# Patient Record
Sex: Male | Born: 1985 | Race: White | Hispanic: No | Marital: Married | Smoking: Never smoker
Health system: Southern US, Community
[De-identification: ages and names within clinical notes are randomized; demographics above are authoritative.]

## PROBLEM LIST (undated history)

## (undated) DIAGNOSIS — D696 Thrombocytopenia, unspecified: Secondary | ICD-10-CM

## (undated) DIAGNOSIS — R002 Palpitations: Secondary | ICD-10-CM

## (undated) DIAGNOSIS — D649 Anemia, unspecified: Secondary | ICD-10-CM

## (undated) DIAGNOSIS — Z87442 Personal history of urinary calculi: Secondary | ICD-10-CM

## (undated) DIAGNOSIS — J329 Chronic sinusitis, unspecified: Secondary | ICD-10-CM

## (undated) DIAGNOSIS — N189 Chronic kidney disease, unspecified: Secondary | ICD-10-CM

## (undated) DIAGNOSIS — G8250 Quadriplegia, unspecified: Secondary | ICD-10-CM

## (undated) DIAGNOSIS — J189 Pneumonia, unspecified organism: Secondary | ICD-10-CM

## (undated) DIAGNOSIS — N319 Neuromuscular dysfunction of bladder, unspecified: Secondary | ICD-10-CM

## (undated) DIAGNOSIS — R011 Cardiac murmur, unspecified: Secondary | ICD-10-CM

## (undated) DIAGNOSIS — N390 Urinary tract infection, site not specified: Secondary | ICD-10-CM

---
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
## 2006-01-31 ENCOUNTER — Encounter: Admission: RE | Admit: 2006-01-31 | Discharge: 2006-01-31 | Payer: Self-pay

## 2006-06-06 HISTORY — PX: OTHER SURGICAL HISTORY: SHX169

## 2007-05-07 DIAGNOSIS — J189 Pneumonia, unspecified organism: Secondary | ICD-10-CM

## 2007-05-07 HISTORY — PX: WISDOM TOOTH EXTRACTION: SHX21

## 2007-05-07 HISTORY — DX: Pneumonia, unspecified organism: J18.9

## 2007-08-12 ENCOUNTER — Inpatient Hospital Stay (HOSPITAL_COMMUNITY): Admission: EM | Admit: 2007-08-12 | Discharge: 2007-08-17 | Payer: Self-pay | Admitting: Emergency Medicine

## 2010-09-18 NOTE — Discharge Summary (Signed)
Ryan Watkins, Ryan Watkins                 ACCOUNT NO.:  [id number]   MEDICAL RECORD NO.:  [id number]          PATIENT TYPE:  INP   LOCATION:  4737                         FACILITY:  [facility]   PHYSICIAN:  Herbie Saxon, MDDATE OF BIRTH:  Jul 14, 1985   DATE OF ADMISSION:  08/12/2007  DATE OF DISCHARGE:  08/17/2007                               DISCHARGE SUMMARY   DISCHARGE DIAGNOSES:  1. Klebsiella urinary tract infection.  2. Bilateral pneumonia.  3. Stage 2 sacral decubitus.  4. History of bronchial asthma.  5. History of kidney stones.  6. History of quadriplegic.  7. Hypoxia.   RADIOLOGY:  The chest x-ray on admission for August 12, 2007 shows basilar  airspace disease, left greater than right.  Chest x-ray on discharge  August 17, 2007, shows left pleural effusion and lingual left lower  airspace disease with right basilar atelectasis.   HISTORY:  This 25 year old Caucasian male, quadriplegic presented to the  emergency room with 3-day history of cough, 2-day history of increasing  shortness of breath.  The patient was also noted to be desaturated at  the primary care physician's office.  The patient started on IV  antibiotics Avelox and clindamycin.  Blood culture and urine culture was  sent.  Urine culture came positive for Klebsiella pneumonia since he was  started on Levaquin.  The patient was nebulized on oxygen  supplementation.  Speech therapy involvement sought and evaluation shows  that he does not have overt signs of aspiration result on August 13, 2007.  The patient has clinically been improving, although there is a lag in  the radiological improvement.  The patient was noted to have a sacral  decubitus stage 2 at presentation and he was on Aqua-Gel dressing daily  for these.  He has been sent home to continue with the sacral wound care  and to be moved in bed every 2-3 hours .   DISCHARGE CONDITION:  Stable.   DIET:  Regular.   ACTIVITIES:  Without changes with  physical therapy.   FOLLOWUP:  Dr. Electa Sniff in 5-7 days to have a repeat urinalysis and chest  x-ray as necessary in one week.  He has been discharged to home health,  RN, and physical therapy care.   DISCHARGE MEDICATIONS:  1. Levaquin 500 mg daily 1 week.  2. Hemocyte Plus 1 tablet daily.  3. Albuterol MDI 2 puffs q.6 h. p.r.n.  4. Advair inhaler 250/50 one puff b.i.d.   PHYSICAL EXAMINATION:  GENERAL:  On examination, he is a young man, not  in acute distress.  VITAL SIGNS:  Temperature 98, pulse 92, respiratory rate 20, and blood  pressure 110/70.  HEENT:  Pale.  Clinically, not jaundiced.  Extraocular muscles are  intact.  Mucous membranes are moist.  Oropharynx and nasopharynx are  clear.  NECK:  Supple.  HEART:  Sounds 1 and 2.  Regular rate and rhythm.  CHEST:  Clear.  Bilateral rales at the bases, left greater than right.  ABDOMEN:  Benign.  NEUROLOGIC: She is alert and oriented to time, place, and person.  EXTREMITIES:  Peripheral pulses are present.  Deep contractures,  quadriplegia.  No pedal edema.   LABORATORY DATA:  WBC of 10, hematocrit 31, and platelet count is 175.  The chemistry shows sodium of 139, potassium is 3.6, chloride 105,  bicarbonate 27, BUN 9, creatinine is 0.4, and glucose is 122.  Blood  culture negative so far.   DISCHARGE PLAN:  Discussed with patient and his mother, they verbalized  understanding.   DISCHARGE TIME:  Greater than 30 minutes.      Herbie Saxon, MD  Electronically Signed     MIO/MEDQ  D:  08/17/2007  T:  08/18/2007  Job:  [id number]
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
## 2011-01-29 LAB — COMPREHENSIVE METABOLIC PANEL
ALT: 22
Albumin: 3.4 — ABNORMAL LOW
Alkaline Phosphatase: 109
BUN: 9
Calcium: 9
Creatinine, Ser: 0.44
GFR calc non Af Amer: >60
Glucose, Bld: 122 — ABNORMAL HIGH
Sodium: 139

## 2011-01-29 LAB — CARDIAC PANEL(CRET KIN+CKTOT+MB+TROPI): CK, MB: 3.5

## 2011-01-29 LAB — BLOOD GAS, ARTERIAL
Acid-Base Excess: 2.1 — ABNORMAL HIGH
Bicarbonate: 26.6 — ABNORMAL HIGH
O2 Saturation: 97.1
pCO2 arterial: 44.8
pH, Arterial: 7.391
pO2, Arterial: 90.4

## 2011-01-29 LAB — CBC
HCT: 31.3 — ABNORMAL LOW
Hemoglobin: 10.8 — ABNORMAL LOW
MCHC: 35.1
MCV: 84.9
MCV: 85.1
Platelets: 204
Platelets: 215
RBC: 3.69 — ABNORMAL LOW
RBC: 4.51
RDW: 14.3
WBC: 10.3
WBC: 15.7 — ABNORMAL HIGH

## 2011-01-29 LAB — URINALYSIS, ROUTINE W REFLEX MICROSCOPIC
Glucose, UA: NEGATIVE
Ketones, ur: 15 — AB
Specific Gravity, Urine: 1.024

## 2011-01-29 LAB — CULTURE, BLOOD (ROUTINE X 2)

## 2011-01-29 LAB — URINE CULTURE: Colony Count: >=100000

## 2011-01-29 LAB — DIFFERENTIAL
Eosinophils Absolute: 0
Eosinophils Relative: 0
Lymphocytes Relative: 3 — ABNORMAL LOW
Lymphs Abs: 0.5 — ABNORMAL LOW
Monocytes Absolute: 1
Monocytes Relative: 6
Neutrophils Relative %: 90 — ABNORMAL HIGH

## 2011-01-29 LAB — URINE MICROSCOPIC-ADD ON

## 2011-01-29 LAB — HEMOGLOBIN A1C: Mean Plasma Glucose: 94

## 2011-07-10 ENCOUNTER — Encounter (HOSPITAL_COMMUNITY): Payer: Self-pay | Admitting: Pharmacy Technician

## 2011-07-10 ENCOUNTER — Encounter (HOSPITAL_COMMUNITY): Payer: Self-pay | Admitting: *Deleted

## 2011-07-10 NOTE — Pre-Procedure Instructions (Signed)
Spoke with pts mother linda Agostinelli and did medical history and pre op instructions

## 2011-07-12 ENCOUNTER — Other Ambulatory Visit: Payer: Self-pay | Admitting: Urology

## 2011-07-22 NOTE — H&P (Addendum)
History of Present Illness   F/u neurogenic bladder on CIC. Pt has C4 quadraplegia. He had bladder stones a few years ago. He does CIC with the help of his mother QID. Oct 2012 US showed no hydronephrosis and 111 mL in bladder. He was changed to [place] last visit from [employer] to help with urge incontinence and autonomic dysreflexia. He tried oxybutynin and had "worse cotton mouth" he's ever had. 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 If he caths he will get wet after about 1-2 hours but sometimes within an hour. He is wet at night. He gets "a wierd feeling, his blood pressure does something funny", he gets sweaty with goose bumps when he leaks. He wears condom catheter. This is bothersome to him.   Interval Hx He follows up today to review his urodynamics and for cystoscopy.  I reviewed the urodynamics that showed a small capacity, hyperreflexic bladder with moderate incontinence. His capacity was 150 mL. His DLPP was 43-49 cm/H20. He voided by involuntary contraction with a slow flow and an elevated postvoid residual. EMG was normal but this may be artifact.   Past Medical History Problems  1. History of  Cosmetic Surgery V50.1 2. History of  Hay Fever 477.9 3. History of  Oral Surgery Tooth Extraction  Surgical History Problems  1. History of  Bladder Cystotomy With Direct Removal Of Calculus 2. History of  Cervical Vertebral Fusion 3. History of  Neck Surgery 4. History of  Surgical Flaps 5. History of  Wrist Surgery  Current Meds 1. Advair Diskus 250-50 MCG/DOSE Inhalation Aerosol Powder Breath Activated; Therapy:  05May2012 to 2. Advil TABS; Therapy: (Recorded:23Oct2008) to 3. Allergy TABS; Therapy: (Recorded:23Oct2008) to 4. Fluticasone Propionate 50 MCG/ACT Nasal Suspension; Therapy: 14Feb2013 to 5. Nitrofurantoin Monohyd Macro 100 MG Oral Capsule; take one capsule bid x 5 days then one  daily; Therapy: 17Jan2013 to (Evaluate:03Mar2013)  Requested for: 17Jan2013; Last  Rx:17Jan2013 6. Sanctura XR 60 MG Oral Capsule Extended Release 24 Hour; TAKE 1 CAPSULE DAILY IN THE  MORNING; Therapy: 15Oct2012 to (Evaluate:10Oct2013)  Requested for: 15Oct2012; Last  Rx:15Oct2012  Allergies Medication  1. Morphine Derivatives  Family History Problems  1. Maternal grandfather's history of  Brain Cancer V16.8 2. Maternal history of  Cervical Cancer 3. Paternal history of  Heart Disease V17.49 4. Paternal grandfather's history of  Heart Disease V17.49 5. Maternal history of  Lung Cancer V16.1  Social History Problems  1. Caffeine Use 2-3 can drinks per day 2. Marital History - Single 3. Never A Smoker Denied  4. Alcohol Use 5. Tobacco Use  Review of Systems Constitutional, skin, eye, otolaryngeal, hematologic/lymphatic, cardiovascular, pulmonary, endocrine, musculoskeletal, gastrointestinal, neurological and psychiatric system(s) were reviewed and pertinent findings if present are noted.  Gastrointestinal: constipation.    Physical Exam Constitutional: Well nourished and well developed . No acute distress.  Pulmonary: No respiratory distress and normal respiratory rhythm and effort.  Cardiovascular: Heart rate and rhythm are normal . No peripheral edema.  The patient is quadrapalegic.    Procedure  Procedure: Cystoscopy  Indication: Lower Urinary Tract Symptoms.  Informed Consent: Risks, benefits, and potential adverse events were discussed and informed consent was obtained from the patient.  Prep: The patient was prepped with betadine.  Antibiotic prophylaxis:. Nitrofurantoin.  Procedure Note:  Urethral meatus:. No abnormalities.  Anterior urethra: No abnormalities.  Prostatic urethra: No abnormalities.  Bladder: Visulization was clear. The ureteral orifices were in the normal anatomic position bilaterally and had clear efflux of urine. A systematic survey  of the bladder demonstrated no bladder tumors or stones. The mucosa was smooth without  abnormalities. Examination of the bladder demonstrated trabeculation. The patient tolerated the procedure well.  Complications: None.    Assessment Assessed  1. Neurogenic Bladder 596.54 2. Urinary Incontinence 788.30   Small capacity unstable bladder with incontinence.   Plan Neurogenic Bladder (596.54)  1. Follow-up Schedule Surgery Office  Follow-up  Requested for: 25Feb2013 Neurogenic Bladder (596.54), Urinary Incontinence (788.30)  2. URINE CULTURE  Done: 25Feb2013  Discussion/Summary     Discussed with the patient and his mother the nature risks benefits and alternatives to Botox. We about alternatives such as bladder augmentation, sphincterotomy, indwelling catheter or neuromodulation. We discussed trial of another anti-cholinergic or beta 3 agonists. We talked about the durability of the treatment and retreatment rates. We discussed risk of persistent or worsening incontinence. We also discussed risks of pain, bleeding, infection and other neuropathy among others. We discussed rare risks such as further nerve paralysis and death. I discussed with the patient the nature, benefits, risks, and alternatives to the procedure. We also discussed the likelihood of achieving the goals of the procedure and potential problems that might occur during the procedure or recuperation. All questions answered. The patient elects to proceed with Botox injection.  H&P update: Pt seen and examined today. He was treated with Cipro last week for UTI. He was having increased autonomic dysreflexia (AD). His urine cx in office grew klebsiella and e coli sensitive which was pan-sensitive with a f/u urine cx negative. Clinically he has improved (decreased AD) after Cipro. His WBC and temp/HR are normal today.

## 2011-07-23 ENCOUNTER — Ambulatory Visit (HOSPITAL_COMMUNITY): Payer: Commercial Managed Care - PPO | Admitting: Anesthesiology

## 2011-07-23 ENCOUNTER — Encounter (HOSPITAL_COMMUNITY): Payer: Self-pay | Admitting: Anesthesiology

## 2011-07-23 ENCOUNTER — Ambulatory Visit (HOSPITAL_COMMUNITY)
Admission: RE | Admit: 2011-07-23 | Discharge: 2011-07-23 | Disposition: A | Discharge status: Home / Self Care | Payer: Commercial Managed Care - PPO | Source: Ambulatory Visit | Attending: Urology | Admitting: Urology

## 2011-07-23 ENCOUNTER — Encounter (HOSPITAL_COMMUNITY): Payer: Self-pay

## 2011-07-23 ENCOUNTER — Encounter (HOSPITAL_COMMUNITY)
Admission: RE | Disposition: A | Discharge status: Home / Self Care | Payer: Self-pay | Source: Ambulatory Visit | Attending: Urology

## 2011-07-23 DIAGNOSIS — Z79899 Other long term (current) drug therapy: Secondary | ICD-10-CM | POA: Insufficient documentation

## 2011-07-23 DIAGNOSIS — G8250 Quadriplegia, unspecified: Secondary | ICD-10-CM | POA: Insufficient documentation

## 2011-07-23 DIAGNOSIS — R32 Unspecified urinary incontinence: Secondary | ICD-10-CM | POA: Insufficient documentation

## 2011-07-23 DIAGNOSIS — N319 Neuromuscular dysfunction of bladder, unspecified: Secondary | ICD-10-CM | POA: Insufficient documentation

## 2011-07-23 HISTORY — DX: Chronic sinusitis, unspecified: J32.9

## 2011-07-23 HISTORY — DX: Quadriplegia, unspecified: G82.50

## 2011-07-23 HISTORY — PX: CYSTOSCOPY WITH INJECTION: SHX1424

## 2011-07-23 HISTORY — DX: Neuromuscular dysfunction of bladder, unspecified: N31.9

## 2011-07-23 HISTORY — DX: Urinary tract infection, site not specified: N39.0

## 2011-07-23 HISTORY — DX: Pneumonia, unspecified organism: J18.9

## 2011-07-23 LAB — CBC
Hemoglobin: 14 g/dL (ref 13.0–17.0)
MCH: 29 pg (ref 26.0–34.0)
MCV: 84 fL (ref 78.0–100.0)
RBC: 4.82 MIL/uL (ref 4.22–5.81)

## 2011-07-23 SURGERY — CYSTOSCOPY, WITH INJECTION OF BLADDER NECK OR BLADDER WALL
Anesthesia: General | Site: Bladder | Wound class: Clean Contaminated

## 2011-07-23 MED ORDER — PROPOFOL 10 MG/ML IV EMUL
INTRAVENOUS | Status: DC | PRN
  Administered 2011-07-23: 200 mg via INTRAVENOUS

## 2011-07-23 MED ORDER — FENTANYL CITRATE 0.05 MG/ML IJ SOLN: 25.0000 ug | INTRAMUSCULAR | Status: DC | PRN

## 2011-07-23 MED ORDER — LACTATED RINGERS IV SOLN
INTRAVENOUS | Status: DC
Start: 2011-07-23 — End: 2011-07-23
  Administered 2011-07-23: 1000 mL via INTRAVENOUS

## 2011-07-23 MED ORDER — ACETAMINOPHEN 10 MG/ML IV SOLN
INTRAVENOUS | Status: DC | PRN
  Administered 2011-07-23: 1000 mg via INTRAVENOUS

## 2011-07-23 MED ORDER — CEFAZOLIN SODIUM 1-5 GM-% IV SOLN
INTRAVENOUS | Status: AC
  Filled 2011-07-23: qty 50

## 2011-07-23 MED ORDER — CEPHALEXIN 500 MG PO CAPS: 500.0000 mg | ORAL_CAPSULE | Freq: Three times a day (TID) | ORAL | Status: AC

## 2011-07-23 MED ORDER — STERILE WATER FOR IRRIGATION IR SOLN
Status: DC | PRN
  Administered 2011-07-23: 3000 mL

## 2011-07-23 MED ORDER — MUPIROCIN 2 % EX OINT
TOPICAL_OINTMENT | CUTANEOUS | Status: AC
  Filled 2011-07-23: qty 22

## 2011-07-23 MED ORDER — MIDAZOLAM HCL 5 MG/5ML IJ SOLN
INTRAMUSCULAR | Status: DC | PRN
  Administered 2011-07-23: 2 mg via INTRAVENOUS

## 2011-07-23 MED ORDER — FENTANYL CITRATE 0.05 MG/ML IJ SOLN
INTRAMUSCULAR | Status: DC | PRN
  Administered 2011-07-23: 100 ug via INTRAVENOUS

## 2011-07-23 MED ORDER — ACETAMINOPHEN 10 MG/ML IV SOLN
INTRAVENOUS | Status: AC
  Filled 2011-07-23: qty 100

## 2011-07-23 MED ORDER — ONABOTULINUMTOXINA 100 UNITS IJ SOLR
INTRAMUSCULAR | Status: DC | PRN
  Administered 2011-07-23: 200 [IU] via INTRAMUSCULAR

## 2011-07-23 MED ORDER — CEFAZOLIN SODIUM 1-5 GM-% IV SOLN
1.0000 g | INTRAVENOUS | Status: AC
  Administered 2011-07-23: 1 g via INTRAVENOUS

## 2011-07-23 MED ORDER — LIDOCAINE HCL (CARDIAC) 20 MG/ML IV SOLN
INTRAVENOUS | Status: DC | PRN
  Administered 2011-07-23: 50 mg via INTRAVENOUS

## 2011-07-23 MED ORDER — ONDANSETRON HCL 4 MG/2ML IJ SOLN
INTRAMUSCULAR | Status: DC | PRN
  Administered 2011-07-23: 4 mg via INTRAVENOUS

## 2011-07-23 MED ORDER — ONABOTULINUMTOXINA 100 UNITS IJ SOLR
400.0000 [IU] | INTRAMUSCULAR | Status: DC
  Filled 2011-07-23: qty 400

## 2011-07-23 SURGICAL SUPPLY — 15 items
BAG URO CATCHER STRL LF (DRAPE) ×2 IMPLANT
CLOTH BEACON ORANGE TIMEOUT ST (SAFETY) ×2 IMPLANT
DEFLUX NEEDLE (NEEDLE) IMPLANT
DEFLUX SYRINGE (SYRINGE) IMPLANT
DRAPE CAMERA CLOSED 9X96 (DRAPES) ×2 IMPLANT
GLOVE BIOGEL M STRL SZ7.5 (GLOVE) ×10 IMPLANT
GOWN STRL NON-REIN LRG LVL3 (GOWN DISPOSABLE) ×2 IMPLANT
GOWN STRL REIN XL XLG (GOWN DISPOSABLE) ×4 IMPLANT
MANIFOLD NEPTUNE II (INSTRUMENTS) ×2 IMPLANT
NDL SAFETY ECLIPSE 18X1.5 (NEEDLE) IMPLANT
NEEDLE HYPO 18GX1.5 SHARP (NEEDLE)
PACK CYSTO (CUSTOM PROCEDURE TRAY) ×2 IMPLANT
SYR CONTROL 10ML LL (SYRINGE) ×2 IMPLANT
TUBING CONNECTING 10 (TUBING) ×2 IMPLANT
WATER STERILE IRR 3000ML UROMA (IV SOLUTION) IMPLANT

## 2011-07-23 NOTE — Anesthesia Postprocedure Evaluation (Signed)
  Anesthesia Post-op Note  Patient: Ryan Watkins  Procedure(s) Performed: Procedure(s) (LRB): CYSTOSCOPY WITH INJECTION (N/A)  Patient Location: PACU  Anesthesia Type: General  Level of Consciousness: oriented and sedated  Airway and Oxygen Therapy: Patient Spontanous Breathing  Post-op Pain: none  Post-op Assessment: Post-op Vital signs reviewed, Patient's Cardiovascular Status Stable, Respiratory Function Stable and Patent Airway  Post-op Vital Signs: stable  Complications: No apparent anesthesia complications

## 2011-07-23 NOTE — Transfer of Care (Signed)
Immediate Anesthesia Transfer of Care Note  Patient: Ryan Watkins  Procedure(s) Performed: Procedure(s) (LRB): CYSTOSCOPY WITH INJECTION (N/A)  Patient Location: PACU  Anesthesia Type: General  Level of Consciousness: awake, alert , oriented and patient cooperative  Airway & Oxygen Therapy: Patient Spontanous Breathing  Post-op Assessment: Report given to PACU RN and Post -op Vital signs reviewed and stable  Post vital signs: Reviewed and stable  Complications: No apparent anesthesia complications

## 2011-07-23 NOTE — Brief Op Note (Signed)
07/23/2011  10:03 AM  PATIENT:  Ryan Watkins  26 y.o. male  PRE-OPERATIVE DIAGNOSIS:  neurogenic bladder,  incontinence  POST-OPERATIVE DIAGNOSIS:  neurogenic bladder  PROCEDURE:  Procedure(s) (LRB): CYSTOSCOPY WITH INJECTION BOTOX  SURGEON:  Surgeon(s) and Role:    * [name], MD - Primary    * Martina Sinner, MD - Assisting   ANESTHESIA:   general  EBL:   minimal   BLOOD ADMINISTERED:none  DRAINS: none   LOCAL MEDICATIONS USED:  NONE  SPECIMEN:  No Specimen  DISPOSITION OF SPECIMEN:  N/A  DICTATION: 161096  PLAN OF CARE: Discharge to home after PACU  PATIENT DISPOSITION:  PACU - hemodynamically stable.   Delay start of Pharmacological VTE agent (>24hrs) due to surgical blood loss or risk of bleeding: not applicable

## 2011-07-23 NOTE — Preoperative (Signed)
Beta Blockers   Reason not to administer Beta Blockers:Not Applicable 

## 2011-07-23 NOTE — Discharge Instructions (Signed)
Cystoscopy (Bladder Exam) Care After Refer to this sheet in the next few weeks. These discharge instructions provide you with general information on caring for yourself after you leave the hospital. Your caregiver may also give you specific instructions. Your treatment has been planned according to the most current medical practices available, but unavoidable complications sometimes occur. If you have any problems or questions after discharge, please call your caregiver. AFTER THE PROCEDURE   There may be temporary bleeding and burning with urination or catheterization.   Drink enough water and fluids to keep your urine clear or pale yellow.  FINDING OUT THE RESULTS OF YOUR TEST Not all test results are available during your visit. If your test results are not back during the visit, make an appointment with your caregiver to find out the results. Do not assume everything is normal if you have not heard from your caregiver or the medical facility. It is important for you to follow up on all of your test results. SEEK IMMEDIATE MEDICAL CARE IF:   There is an increase in blood in the urine or you are passing clots.   There is difficulty passing urine.   You develop the chills.   You have an oral temperature above 102 F (38.9 C), not controlled by medicine.   Belly (abdominal) pain develops.  Document Released: 11/09/2004 Document Revised: 04/11/2011 Document Reviewed: 09/07/2007 [facility] Patient Information 2012 [place], [place].

## 2011-07-23 NOTE — Progress Notes (Signed)
Unable to draw CBC. Spoke w Darlene in [place] and said she would draw labs w IV start

## 2011-07-23 NOTE — Anesthesia Preprocedure Evaluation (Addendum)
Anesthesia Evaluation  Patient identified by MRN, date of birth, ID band Patient awake    Reviewed: Allergy & Precautions, H&P , NPO status , Patient's Chart, lab work & pertinent test results, reviewed documented beta blocker date and time   Airway Mallampati: I TM Distance: >3 FB Neck ROM: Full    Dental  (+) Teeth Intact   Pulmonary neg pulmonary ROS,  breath sounds clear to auscultation        Cardiovascular negative cardio ROS  Rhythm:Regular Rate:Normal  Denies cardiac symptoms   Neuro/Psych paraplegia negative psych ROS   GI/Hepatic negative GI ROS, Neg liver ROS,   Endo/Other  negative endocrine ROS  Renal/GU negative Renal ROS   Neurogenic bladder    Musculoskeletal negative musculoskeletal ROS (+)   Abdominal   Peds negative pediatric ROS (+)  Hematology negative hematology ROS (+)   Anesthesia Other Findings   Reproductive/Obstetrics negative OB ROS                           Anesthesia Physical Anesthesia Plan  ASA: III  Anesthesia Plan: General   Post-op Pain Management:    Induction:   Airway Management Planned: LMA  Additional Equipment:   Intra-op Plan:   Post-operative Plan: Extubation in OR  Informed Consent: I have reviewed the patients History and Physical, chart, labs and discussed the procedure including the risks, benefits and alternatives for the proposed anesthesia with the patient or authorized representative who has indicated his/her understanding and acceptance.     Plan Discussed with: CRNA and Surgeon  Anesthesia Plan Comments:         Anesthesia Quick Evaluation

## 2011-07-24 NOTE — Op Note (Signed)
Ryan Watkins, MOONE NO.:  [id number]  MEDICAL RECORD NO.:  [id number]  LOCATION:  [facility]                         FACILITY:  [facility]  PHYSICIAN:  [name], MD   DATE OF BIRTH:  1985/11/23  DATE OF PROCEDURE: DATE OF DISCHARGE:  07/23/2011                              OPERATIVE REPORT   PREOPERATIVE DIAGNOSES: 1. Neurogenic bladder. 2. Incontinence.  POSTOPERATIVE DIAGNOSES: 1. Neurogenic bladder. 2. Incontinence.  PROCEDURE:  Cystoscopy with bladder injection of Botox.  SURGEON:  [name], MD.  ASSISTANT SURGEON:  Martina Sinner, MD.  ANESTHESIA:  General.  INDICATION FOR PROCEDURE:  Ryan Watkins is a 26 year old male who is a quadriplegic.  His caretaker, which is his mother, performs clean intermittent catheterization 4 times a day, but the patient is incontinent in between catheterizations and at night.  Cystoscopy was normal, but urodynamics revealed a small capacity hyper-reflexic bladder with poor emptying.  The patient has failed multiple antimuscarinics.  I discussed with the patient and his mother the nature, risks, benefits and alternatives to cystoscopy with Botox injection into the bladder wall.  All questions were answered and he elected to proceed with Botox injection.  FINDINGS:  On cystoscopy, the meatus was a bit tight, but the scope was able to pass with the obturator.  The urethra appeared normal.  The prostatic urethra was short and nonobstructing.  The bladder was normal without lesion mass or stone.  DESCRIPTION OF PROCEDURE:  After consent was obtained, the patient was brought into the operating room.  A time-out was performed to confirm the patient and procedure.  After adequate anesthesia, preop antibiotics, he was placed in lithotomy position and the external genitalia prepped and draped in the usual fashion.  The obturator was used to get the sheath into the urethra, then the cystoscope was advanced into  the bladder.  The bladder was drained, and then filled to an adequate capacity for injection.  The bladder was injected on the left side wall followed by the right side wall and then posteriorly for a total of 20 injections with 10 units/cc injection for a total of 200 units.  The patient's bladder was then drained.  There was excellent hemostasis.  The scope was then removed, and the patient was awakened and taken to recovery room in stable condition.  COMPLICATIONS:  None.  ESTIMATED BLOOD LOSS:  Minimal.  DRAINS:  None.  DISPOSITION:  Patient stable to PACU.          ______________________________ [name], MD     ME/MEDQ  D:  07/23/2011  T:  07/24/2011  Job:  [id number]

## 2011-07-31 ENCOUNTER — Encounter (HOSPITAL_COMMUNITY): Payer: Self-pay | Admitting: Urology

## 2012-01-29 ENCOUNTER — Other Ambulatory Visit: Payer: Self-pay | Admitting: Urology

## 2012-02-04 ENCOUNTER — Encounter (HOSPITAL_COMMUNITY): Payer: Self-pay | Admitting: Pharmacy Technician

## 2012-02-06 ENCOUNTER — Encounter (HOSPITAL_COMMUNITY): Payer: Self-pay | Admitting: *Deleted

## 2012-02-11 NOTE — Progress Notes (Signed)
02-11-12 1030 AM voice message left on Mother- Basim Bartnik' s cell [phone] of patients surgery time change to 12:15PM to arrive by 0945 AM to Short Stay. Other instructions unchanged. Please return call to verify [phone]. W. Kennon Portela

## 2012-02-17 NOTE — H&P (Signed)
History of Present Illness     F/u neurogenic bladder on CIC. Pt has C4 quadraplegia. He had bladder stones a few years ago. He does CIC with the help of his mother QID. Oct 2012 US showed no hydronephrosis and 111 mL in bladder. He was changed to Reunion last visit from Enablex to help with urge incontinence and autonomic dysreflexia. He tried oxybutynin and had "worse cotton mouth" he's ever had. 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 If he caths he will get wet after about 1-2 hours but sometimes within an hour. He is wet at night. He gets "a wierd feeling, his blood pressure does something funny", he gets sweaty with goose bumps when he leaks. He wears condom catheter. This is bothersome to him.   Cystoscopy Feb 2013 - normal  UDS - Feb 2013 - showed a small capacity, hyperreflexic bladder with moderate incontinence. His capacity was 150 mL. His DLPP was 43-49 cm/H20. He voided by involuntary contraction with a slow flow and an elevated postvoid residual. EMG was normal but this may be artifact.  Cysto/Botox injection in OR Mar 2013.   Interval Hx  He was admitted to hospital Aug 2013 . He was sick with fever. His blood and urine cx's grew e coli per pt. He completed an abx course for 2 weeks. He saw his PCP and was told he still had the infection. He completed another abx course, nitrofurantoin bid for 10 days.    He continues CIC. After the botox the sweaty sensation and leakage improved. He developed increased leakage between caths about 4 months after botox. He also noted a "sweaty feeling" again.  Now he leaks between every cath.    Past Medical History Problems  1. History of  Cosmetic Surgery V50.1 2. History of  Hay Fever 477.9 3. History of  Oral Surgery Tooth Extraction  Surgical History Problems  1. History of  Bladder Cystotomy With Direct Removal Of Calculus 2. History of  Cervical Vertebral Fusion 3. History of  Neck Surgery 4. History of  Surgical Flaps 5. History  of  Urologic Surgery 6. History of  Wrist Surgery  Current Meds 1. Advair Diskus 250-50 MCG/DOSE Inhalation Aerosol Powder Breath Activated; Therapy:  05May2012 to 2. Advil TABS; Therapy: (Recorded:23Oct2008) to 3. Allergy TABS; Therapy: (Recorded:23Oct2008) to  Allergies Medication  1. Morphine Derivatives  Family History Problems  1. Maternal grandfather's history of  Brain Cancer V16.8 2. Maternal history of  Cervical Cancer 3. Paternal history of  Heart Disease V17.49 4. Paternal grandfather's history of  Heart Disease V17.49 5. Maternal history of  Lung Cancer V16.1  Social History Problems  1. Caffeine Use 2-3 can drinks per day 2. Marital History - Single 3. Never A Smoker Denied  4. Alcohol Use 5. Tobacco Use  Vitals Vital Signs [Data Includes: Last 1 Day]  25Sep2013 11:11AM  Blood Pressure: 81 / 55 Temperature: 97.4 F Heart Rate: 86  Physical Exam Constitutional: Well nourished and well developed . No acute distress.  Pulmonary: No respiratory distress and normal respiratory rhythm and effort.  Cardiovascular: Heart rate and rhythm are normal . No peripheral edema.  Neuro/Psych:. Mood and affect are appropriate.    Results/Data  29 Jan 2012 10:54 AM   UA With REFLEX       COLOR YELLOW       APPEARANCE CLOUDY       SPECIFIC GRAVITY 1.015       pH 6.0       GLUCOSE  NEG       BILIRUBIN NEG       KETONE NEG       BLOOD LARGE       PROTEIN NEG       UROBILINOGEN 0.2       NITRITE NEG       LEUKOCYTE ESTERASE MOD       SQUAMOUS EPITHELIAL/HPF RARE       WBC 21-50       CRYSTALS Calcium Oxalate crystals noted       CASTS NONE SEEN       RBC NONE SEEN       BACTERIA MANY      Assessment Assessed  1. Neurogenic Bladder 596.54  Plan  Neurogenic Bladder (596.54)  1. Oxybutynin Chloride ER 10 MG Oral Tablet Extended Release 24 Hour; Take 1 tablet by mouth  daily; Therapy: 25Sep2013 to (Last Rx:25Sep2013)  Requested for: 25Sep2013; Edited 2.  Follow-up Month x 3 Office  Follow-up  Requested for: 30Dec2013 3. Follow-up Schedule Surgery Office  Follow-up  Done: 25Sep2013 4. RENAL U/S COMPLETE  Requested for: 30Dec2013  UA With REFLEX  Status: Resulted - Requires Verification  Done: 01Jan0001 12:00AM Ordered Today; For: Health Maintenance (V70.0); Ordered By: [name]  Due: 27Sep2013 Marked Important; Last Updated By: Emmaline Life URINE CULTURE  Status: Resulted - Requires Verification  Done: 01Jan0001 12:00AM Ordered Today; For: Urinary Tract Infection (599.0); Ordered By: [name]  Due: 27Sep2013 Marked Important; Last Updated By: Nathaniel Man   Discussion/Summary     We discussed as the botox wore off and he developed high pressure bladder contractions which led to a UTI. I discussed with patient and fiance these are the signs he needs to look for and notify me off as the botox wears off (sweaty sensation equals of autonomic dysreflexia, incontinence). We will restart oxybutynin and set him up for repeat botox. He elects to proceed with botox.     Signatures Electronically signed by : [name], M.D.; Feb 03 2012  8:13AM  Interval update - urine Cx grew e coli. Pt started on Cipro.

## 2012-02-18 ENCOUNTER — Ambulatory Visit (HOSPITAL_COMMUNITY): Payer: Commercial Managed Care - PPO | Admitting: Anesthesiology

## 2012-02-18 ENCOUNTER — Encounter (HOSPITAL_COMMUNITY): Payer: Self-pay | Admitting: Anesthesiology

## 2012-02-18 ENCOUNTER — Ambulatory Visit (HOSPITAL_COMMUNITY)
Admission: RE | Admit: 2012-02-18 | Discharge: 2012-02-18 | Disposition: A | Discharge status: Home / Self Care | Payer: Commercial Managed Care - PPO | Source: Ambulatory Visit | Attending: Urology | Admitting: Urology

## 2012-02-18 ENCOUNTER — Ambulatory Visit (HOSPITAL_COMMUNITY): Payer: Commercial Managed Care - PPO

## 2012-02-18 ENCOUNTER — Encounter (HOSPITAL_COMMUNITY)
Admission: RE | Disposition: A | Discharge status: Home / Self Care | Payer: Self-pay | Source: Ambulatory Visit | Attending: Urology

## 2012-02-18 ENCOUNTER — Encounter (HOSPITAL_COMMUNITY): Payer: Self-pay | Admitting: *Deleted

## 2012-02-18 DIAGNOSIS — N319 Neuromuscular dysfunction of bladder, unspecified: Secondary | ICD-10-CM | POA: Insufficient documentation

## 2012-02-18 DIAGNOSIS — Z79899 Other long term (current) drug therapy: Secondary | ICD-10-CM | POA: Insufficient documentation

## 2012-02-18 DIAGNOSIS — R32 Unspecified urinary incontinence: Secondary | ICD-10-CM | POA: Insufficient documentation

## 2012-02-18 DIAGNOSIS — J309 Allergic rhinitis, unspecified: Secondary | ICD-10-CM | POA: Insufficient documentation

## 2012-02-18 HISTORY — PX: CYSTOSCOPY: SHX5120

## 2012-02-18 LAB — CBC
Hemoglobin: 13.9 g/dL (ref 13.0–17.0)
MCH: 29.3 pg (ref 26.0–34.0)
MCV: 82.5 fL (ref 78.0–100.0)
RBC: 4.74 MIL/uL (ref 4.22–5.81)

## 2012-02-18 SURGERY — BOTOX INJECTION
Anesthesia: General | Site: Bladder | Wound class: Clean Contaminated

## 2012-02-18 MED ORDER — LACTATED RINGERS IV SOLN: INTRAVENOUS | Status: DC

## 2012-02-18 MED ORDER — ONDANSETRON HCL 4 MG/2ML IJ SOLN
INTRAMUSCULAR | Status: DC | PRN
  Administered 2012-02-18: 4 mg via INTRAVENOUS

## 2012-02-18 MED ORDER — CEFAZOLIN SODIUM-DEXTROSE 2-3 GM-% IV SOLR
2.0000 g | Freq: Once | INTRAVENOUS | Status: AC
  Administered 2012-02-18: 2 g via INTRAVENOUS

## 2012-02-18 MED ORDER — ONABOTULINUMTOXINA 100 UNITS IJ SOLR
300.0000 [IU] | Freq: Once | INTRAMUSCULAR | Status: DC
  Filled 2012-02-18: qty 300

## 2012-02-18 MED ORDER — FENTANYL CITRATE 0.05 MG/ML IJ SOLN: 25.0000 ug | INTRAMUSCULAR | Status: DC | PRN

## 2012-02-18 MED ORDER — PROPOFOL 10 MG/ML IV BOLUS
INTRAVENOUS | Status: DC | PRN
  Administered 2012-02-18: 200 mg via INTRAVENOUS

## 2012-02-18 MED ORDER — MUPIROCIN 2 % EX OINT
TOPICAL_OINTMENT | Freq: Two times a day (BID) | CUTANEOUS | Status: DC
  Filled 2012-02-18: qty 22

## 2012-02-18 MED ORDER — FENTANYL CITRATE 0.05 MG/ML IJ SOLN
INTRAMUSCULAR | Status: DC | PRN
  Administered 2012-02-18: 100 ug via INTRAVENOUS

## 2012-02-18 MED ORDER — MIDAZOLAM HCL 5 MG/5ML IJ SOLN
INTRAMUSCULAR | Status: DC | PRN
  Administered 2012-02-18: 2 mg via INTRAVENOUS

## 2012-02-18 MED ORDER — LACTATED RINGERS IV SOLN
INTRAVENOUS | Status: DC
Start: 2012-02-18 — End: 2012-02-18
  Administered 2012-02-18: 1000 mL via INTRAVENOUS

## 2012-02-18 MED ORDER — ONABOTULINUMTOXINA 100 UNITS IJ SOLR
INTRAMUSCULAR | Status: DC | PRN
  Administered 2012-02-18: 300 [IU] via INTRAMUSCULAR

## 2012-02-18 MED ORDER — OXYBUTYNIN CHLORIDE ER 10 MG PO TB24: 10.0000 mg | ORAL_TABLET | Freq: Every morning | ORAL | Status: DC

## 2012-02-18 MED ORDER — PROMETHAZINE HCL 25 MG/ML IJ SOLN: 6.2500 mg | INTRAMUSCULAR | Status: DC | PRN

## 2012-02-18 MED ORDER — LIDOCAINE HCL (CARDIAC) 20 MG/ML IV SOLN
INTRAVENOUS | Status: DC | PRN
  Administered 2012-02-18: 50 mg via INTRAVENOUS

## 2012-02-18 MED ORDER — CEFAZOLIN SODIUM-DEXTROSE 2-3 GM-% IV SOLR
INTRAVENOUS | Status: AC
  Filled 2012-02-18: qty 50

## 2012-02-18 MED ORDER — STERILE WATER FOR IRRIGATION IR SOLN
Status: DC | PRN
  Administered 2012-02-18: 1000 mL via INTRAVESICAL

## 2012-02-18 MED ORDER — MEPERIDINE HCL 50 MG/ML IJ SOLN: 6.2500 mg | INTRAMUSCULAR | Status: DC | PRN

## 2012-02-18 SURGICAL SUPPLY — 14 items
BAG URINE DRAINAGE (UROLOGICAL SUPPLIES) ×2 IMPLANT
BAG URO CATCHER STRL LF (DRAPE) ×2 IMPLANT
CATH FOLEY 2WAY SLVR  5CC 16FR (CATHETERS) ×1
CATH FOLEY 2WAY SLVR 5CC 16FR (CATHETERS) ×1 IMPLANT
CLOTH BEACON ORANGE TIMEOUT ST (SAFETY) ×2 IMPLANT
DRAPE CAMERA CLOSED 9X96 (DRAPES) ×2 IMPLANT
GLOVE BIOGEL M STRL SZ7.5 (GLOVE) ×2 IMPLANT
GOWN STRL NON-REIN LRG LVL3 (GOWN DISPOSABLE) ×2 IMPLANT
GOWN STRL REIN XL XLG (GOWN DISPOSABLE) ×2 IMPLANT
HOLDER FOLEY CATH W/STRAP (MISCELLANEOUS) ×2 IMPLANT
MANIFOLD NEPTUNE II (INSTRUMENTS) ×2 IMPLANT
PACK CYSTO (CUSTOM PROCEDURE TRAY) ×2 IMPLANT
SYR 20CC LL (SYRINGE) ×2 IMPLANT
TUBING CONNECTING 10 (TUBING) ×2 IMPLANT

## 2012-02-18 NOTE — Transfer of Care (Signed)
Immediate Anesthesia Transfer of Care Note  Patient: Ryan Watkins  Procedure(s) Performed: Procedure(s) (LRB) with comments: BOTOX INJECTION (N/A) CYSTOSCOPY (N/A)  Patient Location: PACU  Anesthesia Type: General  Level of Consciousness: awake, alert , oriented and patient cooperative  Airway & Oxygen Therapy: Patient Spontanous Breathing and Patient connected to face mask oxygen  Post-op Assessment: Report given to PACU RN and Post -op Vital signs reviewed and stable  Post vital signs: Reviewed and stable  Complications: No apparent anesthesia complications

## 2012-02-18 NOTE — Interval H&P Note (Signed)
History and Physical Interval Note:  02/18/2012 12:59 PM  Ryan Watkins  has presented today for surgery, with the diagnosis of Neurogenic Bladder  The various methods of treatment have been discussed with the patient and family. After consideration of risks, benefits and other options for treatment, the patient has consented to  Procedure(s) (LRB) with comments: BOTOX INJECTION (N/A) CYSTOSCOPY (N/A) as a surgical intervention .  The patient's history has been reviewed, patient examined, no change in status, stable for surgery.  I have reviewed the patient's chart and labs.  Questions were answered to the patient's satisfaction.  We discussed the off-label higher dose and he elects to proceed. He's had no fever or chills. His WBC count is normal. On Cipro.    [name]

## 2012-02-18 NOTE — Anesthesia Preprocedure Evaluation (Signed)
Anesthesia Evaluation  Patient identified by MRN, date of birth, ID band Patient awake    Reviewed: Allergy & Precautions, H&P , NPO status , Patient's Chart, lab work & pertinent test results, reviewed documented beta blocker date and time   Airway Mallampati: I TM Distance: >3 FB Neck ROM: Full    Dental  (+) Teeth Intact   Pulmonary neg pulmonary ROS, asthma , neg pneumonia -,  breath sounds clear to auscultation        Cardiovascular negative cardio ROS  Rhythm:Regular Rate:Normal  Denies cardiac symptoms   Neuro/Psych Quadraplegia. C4. negative psych ROS   GI/Hepatic negative GI ROS, Neg liver ROS,   Endo/Other  negative endocrine ROS  Renal/GU negative Renal ROS   Neurogenic bladder    Musculoskeletal negative musculoskeletal ROS (+)   Abdominal   Peds negative pediatric ROS (+)  Hematology negative hematology ROS (+)   Anesthesia Other Findings Previous trach. closed  Reproductive/Obstetrics negative OB ROS                           Anesthesia Physical  Anesthesia Plan  ASA: III  Anesthesia Plan: General   Post-op Pain Management:    Induction:   Airway Management Planned: LMA  Additional Equipment:   Intra-op Plan:   Post-operative Plan: Extubation in OR  Informed Consent: I have reviewed the patients History and Physical, chart, labs and discussed the procedure including the risks, benefits and alternatives for the proposed anesthesia with the patient or authorized representative who has indicated his/her understanding and acceptance.     Plan Discussed with: CRNA and Surgeon  Anesthesia Plan Comments:         Anesthesia Quick Evaluation

## 2012-02-18 NOTE — Anesthesia Postprocedure Evaluation (Signed)
  Anesthesia Post-op Note  Patient: Ryan Watkins  Procedure(s) Performed: Procedure(s) (LRB): BOTOX INJECTION (N/A) CYSTOSCOPY (N/A)  Patient Location: PACU  Anesthesia Type: General  Level of Consciousness: awake and alert   Airway and Oxygen Therapy: Patient Spontanous Breathing  Post-op Pain: mild  Post-op Assessment: Post-op Vital signs reviewed, Patient's Cardiovascular Status Stable, Respiratory Function Stable, Patent Airway and No signs of Nausea or vomiting  Post-op Vital Signs: stable  Complications: No apparent anesthesia complications

## 2012-02-18 NOTE — Op Note (Signed)
Preoperative diagnosis: Neurogenic bladder, incontinence Postoperative diagnosis: Same  Procedure: Cystoscopy with Botox injection  Surgeon: Mena Goes  Anesthesia: Gen.  Description of procedure: After consent was obtained patient brought to the operating room. A timeout was performed to confirm the patient and procedure. After adequate anesthesia the cystoscope with injection sheath was passed per urethra without difficulty. The anterior and prostatic urethra were normal. The trigone and ureteral orifices were normal. The bladder per normal without foreign body, mass, lesion or tumor.  300 units of Botox was injected. Botox was diluted to 10 units per cc. 1 cc was injected at 10 sites on the left bladder wall, 1 cc was injected at 10 sites on the posterior bladder wall, 1 cc was injected at 10 sites on the right bladder wall.   A 16 French Foley catheter was placed without difficulty.  Complications: None  Estimated blood loss: Minimal  Specimens: None  Drains: 16 French Foley  Disposition: Patient stable to PACU

## 2012-02-18 NOTE — Progress Notes (Signed)
Discharge instructions given to patient and patient's mother. Both verbalizing understanding with teachback. No questions.

## 2012-02-19 ENCOUNTER — Encounter (HOSPITAL_COMMUNITY): Payer: Self-pay | Admitting: Urology

## 2012-02-20 LAB — URINE CULTURE: Culture: NO GROWTH

## 2012-07-20 ENCOUNTER — Other Ambulatory Visit: Payer: Self-pay | Admitting: Urology

## 2012-07-24 ENCOUNTER — Encounter (HOSPITAL_COMMUNITY): Payer: Self-pay | Admitting: Pharmacy Technician

## 2012-07-30 NOTE — Progress Notes (Signed)
07-30-12 1600 Instructed Mother to have pt. Use Dulera Inhaler and bring, use Flonase as usual. Follow other previous  Instructions. W. Kennon Portela

## 2012-08-06 NOTE — H&P (Deleted)
History of Present Illness                F/u LUTS and BPH, hypogonasism seen Mar 2014. His PCP is Dr. Hulan Fess.   BPH/LUTS/Incomplete emptying   -1992 TURP -Sept 2009 - PSA 0.14 -2013 - Peripheral neuropathy with decreased sensation and cane use  - required I/O cath and drained 700 ml.  Reports extensive w/u - (brain and spinal imaging, spinal tap, bone marrow and endocrine eval). Some of this done at [facility].   -Sept 2013 - PSA 0.12 -Dec 2013 Abd U/S, comparison CT Dec 2012 - normal kidneys and no hydronephrosis -Mar 2014 PVR 113 ml, c/o weak stream, "bloated feeling". No frequency, urgency, dysuria or hematuria   Hypogonadism -Feb 2014 - T levels were 18, free 2. His prolactin was high, but FSH and LH were very high. His estrogen was normal. Pt started T supplementation. He has 4 children.       Interval Hx  He returns and started tamsulosin last visit. He noted . He has a "bloated feeling" in the SP area all the way back through the rectum.   Today he does tell me he did have brain and spinal imaging form his "head all the way down to the tail bone".   Here is here to review UDS and for cystoscopy.   Mar 2014 UDS - large, hypocontractile bladder with straining to void. Pre-study PVR 40 ml. Capacity of 900 ml ("bloated feeling") sensation nl to inc, bladder stable. Voided 200 ml at flow 8 cc/sec, detrusor low amplitude 7-10 cm/h20 and straining.  EMG increased with straining.    Past Medical History Problems  1. History of  Acute Myocardial Infarction V12.59 2. History of  Hypercholesterolemia 272.0 3. History of  Seizure  Surgical History Problems  1. History of  Ankle Repair 2. History of  CABG (CABG) V45.81 3. History of  Colon Surgery 4. History of  Prostate Surgery  Current Meds 1. Aspirin 81 MG Oral Tablet; Therapy: (Recorded:20Mar2014) to 2. Calcium TABS; Therapy: (Recorded:20Mar2014) to 3. CoQ-10 100 MG Oral Capsule; Therapy: (Recorded:20Mar2014) to 4.  Dilantin 100 MG Oral Capsule; Therapy: (Recorded:20Mar2014) to 5. Lipitor 20 MG Oral Tablet; Therapy: (Recorded:20Mar2014) to 6. Multi-Vitamin TABS; Therapy: (Recorded:20Mar2014) to 7. Omega 3 CAPS; Therapy: (Recorded:20Mar2014) to 8. PHENobarbital 97.2 MG Oral Tablet; Therapy: (Recorded:20Mar2014) to 9. PredniSONE 20 MG Oral Tablet; Therapy: (Recorded:20Mar2014) to 10. PreserVision/Lutein Oral Capsule; Therapy: (Recorded:20Mar2014) to 11. Tamsulosin HCl 0.4 MG Oral Capsule; Take one capsule daily; Therapy: [id number] to   (Evaluate:15Mar2015)  Requested for: [id number]; Last Rx:20Mar2014  Allergies Medication  1. Amoxicillin CAPS  Family History Problems  1. Family history of  Death In The Family Father Died age 88-Coronary 2. Family history of  Death In The Family Mother Died age 29-Heart failure 3. Family history of  Family Health Status Number Of Children 2 sons and 2 daughters  Social History Problems  1. Being A Social Drinker 2. Caffeine Use 4 per day 3. Former Smoker V15.82 Smoked 1 ppd for 5 yrs and quit 1967 4. Marital History - Currently Married 5. Occupation: Retired  Review of Systems  Gastrointestinal: constipation.  Cardiovascular: no chest pain.  Respiratory: no shortness of breath.    Physical Exam Constitutional: Well nourished and well developed . No acute distress.  Pulmonary: No respiratory distress and normal respiratory rhythm and effort.  Cardiovascular: Heart rate and rhythm are normal . No peripheral edema.  Neuro/Psych:. Mood and affect are appropriate.  Results/Data Urine [Data Includes: Last 1 Day]   31Mar2014  COLOR YELLOW   APPEARANCE CLEAR   SPECIFIC GRAVITY 1.025   pH 6.0   GLUCOSE NEG mg/dL  BILIRUBIN NEG   KETONE NEG mg/dL  BLOOD NEG   PROTEIN NEG mg/dL  UROBILINOGEN 0.2 mg/dL  NITRITE NEG   LEUKOCYTE ESTERASE NEG    Procedure  Procedure: Cystoscopy   Informed Consent: Risks, benefits, and potential adverse events  were discussed and informed consent was obtained from the patient.  Prep: The patient was prepped with betadine.  Procedure Note:  Urethral meatus:. No abnormalities.  Anterior urethra: No abnormalities.  Prostatic urethra: No abnormalities.  Good TURP defect but at the bladder neck there is regrowth of a small flap of tissue causing 100% VO of bladder neck.  Bladder: Visulization was clear. The ureteral orifices were in the normal anatomic position bilaterally and had clear efflux of urine. A systematic survey of the bladder demonstrated no bladder tumors or stones. The mucosa was smooth without abnormalities. The patient tolerated the procedure well.  Complications: None.    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 Incomplete Emptying Of Bladder 788.21  Plan Health Maintenance (V70.0)  1. UA With REFLEX  Done: 31Mar2014 09:51AM  Discussion/Summary           We discussed options such as surveillance, foley, CIC, Interstim and R/B of each approach. IS may be a good option but I would like for him to stabilize from a neurologic perspective. He will continue on tamsulosin.   In regards to the VO prostate tissue we discussed the nature, R/B of TUR of this residual/recurrent tissue. 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 Therefore, repeat TUR may or may not help him empty better but I think the risks of bleeding, incontinence and scar tissue/sx would be low with the possible benefit of a better flow and better emptying. He elects to proceed with repeat TURP. We also discussed CV / pulm risks associated with anesthesia and he may or may not need a foley post - op. With his h/o of CIC last year a foley overnight is probably wise.      Signatures Electronically signed by : [name], M.D.; Aug 03 2012 11:04AM

## 2012-08-06 NOTE — H&P (Signed)
History of Present Illness                               F/u neurogenic bladder on CIC. Pt has C4 quadraplegia. He had bladder stones a few years ago. He does CIC with the help of his mother QID. -Oct 2012 US showed no hydronephrosis and 111 mL in bladder. He was changed to Reunion last visit from Enablex to help with urge incontinence and autonomic dysreflexia. He tried oxybutynin and had "worse cotton mouth" he's ever had. 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 If he caths he will get wet after about 1-2 hours but sometimes within an hour. He is wet at night. He gets "a wierd feeling, his blood pressure does something funny", he gets sweaty with goose bumps when he leaks. He wears condom catheter. This is bothersome to him.  -Feb 2013 - cystoscopy normal -Feb 2013 - UDS - showed a small capacity, hyperreflexic bladder with moderate incontinence. His capacity was 150 mL. His DLPP was 43-49 cm/H20. He voided by involuntary contraction with a slow flow and an elevated postvoid residual. EMG was normal but this may be artifact. -Mar 2013 - Cysto/Botox injection in OR Mar 2013. -Aug 2013 - UTI and admission -Oct 2013 Botox 300U injection  -Dec 2013 Renal US - normal apart from poss 5 mm and 4 mm right renal stone    Interval Hx He returns for KUB. He noticed some leakage a couple of weeks ago but it has not recurred. He is leaking a little at night which has started again. He has not had any abx. He has no fever and feels well. He is getting married in one month.   KUB today - 11 x 4 mm right LP stone. [place]. Bones and bowel gas pattern normal.     Past Medical History Problems  1. History of  Cosmetic Surgery V50.1 2. History of  Hay Fever 477.9 3. History of  Oral Surgery Tooth Extraction  Surgical History Problems  1. History of  Bladder Cystotomy With Direct Removal Of Calculus 2. History of  Cervical Vertebral Fusion 3. History of  Neck Surgery 4. History of  Surgical  Flaps 5. History of  Urologic Surgery 6. History of  Urologic Surgery 7. History of  Wrist Surgery  Current Meds 1. Advair Diskus 250-50 MCG/DOSE Inhalation Aerosol Powder Breath Activated; Therapy:  05May2012 to 2. Advil TABS; Therapy: (Recorded:23Oct2008) to 3. Allergy TABS; Therapy: (Recorded:23Oct2008) to  Allergies Medication  1. Morphine Derivatives  Family History Problems  1. Maternal grandfather's history of  Brain Cancer V16.8 2. Maternal history of  Cervical Cancer 3. Paternal history of  Heart Disease V17.49 4. Paternal grandfather's history of  Heart Disease V17.49 5. Maternal history of  Lung Cancer V16.1  Social History Problems  1. Caffeine Use 2-3 can drinks per day 2. Marital History - Single 3. Never A Smoker Denied  4. Alcohol Use 5. Tobacco Use  Review of Systems Constitutional system(s) were reviewed and pertinent findings if present are noted.  Cardiovascular: no chest pain.  Respiratory: no shortness of breath.    Vitals Vital Signs [Data Includes: Last 1 Day]  31Mar2014 10:59AM  Blood Pressure: 142 / 91 Temperature: 97.7 F Heart Rate: 77  Physical Exam Constitutional: Well nourished and well developed . No acute distress.  Pulmonary: No respiratory distress and normal respiratory rhythm and effort.  Cardiovascular: Heart rate and rhythm are normal .  No peripheral edema.  Neuro/Psych:. Mood and affect are appropriate.    Results/Data Urine [Data Includes: Last 1 Day]   31Mar2014  COLOR YELLOW   APPEARANCE CLEAR   SPECIFIC GRAVITY 1.010   pH 6.0   GLUCOSE NEG mg/dL  BILIRUBIN NEG   KETONE NEG mg/dL  BLOOD NEG   PROTEIN NEG mg/dL  UROBILINOGEN 0.2 mg/dL  NITRITE POS   LEUKOCYTE ESTERASE SMALL   SQUAMOUS EPITHELIAL/HPF NONE SEEN   WBC 21-50 WBC/hpf  RBC NONE SEEN RBC/hpf  BACTERIA MANY   CRYSTALS NONE SEEN   CASTS NONE SEEN    Assessment Assessed  1. Asymptomatic Bacteruria 791.9 2. Neurogenic Bladder 596.54 3.  Nephrolithiasis 592.0  Plan Asymptomatic Bacteruria (791.9)  1. URINE CULTURE  Done: 31Mar2014 Health Maintenance (V70.0)  2. UA With REFLEX  Done: 31Mar2014 10:14AM Nephrolithiasis (592.0)  3. Follow-up Month x 4 Office  Follow-up  Requested for: 31Mar2014 4. KUB  Requested for: 31Mar2014  Discussion/Summary     Send urine for Cx as a precaution. Botox Friday - will start abx Wed or Thurs based on Culture.     Signatures Electronically signed by : [name], M.D.; Aug 03 2012 11:28AM

## 2012-08-07 ENCOUNTER — Ambulatory Visit (HOSPITAL_COMMUNITY)
Admission: RE | Admit: 2012-08-07 | Discharge: 2012-08-07 | Disposition: A | Discharge status: Home / Self Care | Payer: Commercial Managed Care - PPO | Source: Ambulatory Visit | Attending: Urology | Admitting: Urology

## 2012-08-07 ENCOUNTER — Ambulatory Visit (HOSPITAL_COMMUNITY): Payer: Commercial Managed Care - PPO | Admitting: Anesthesiology

## 2012-08-07 ENCOUNTER — Encounter (HOSPITAL_COMMUNITY)
Admission: RE | Disposition: A | Discharge status: Home / Self Care | Payer: Self-pay | Source: Ambulatory Visit | Attending: Urology

## 2012-08-07 ENCOUNTER — Encounter (HOSPITAL_COMMUNITY): Payer: Self-pay | Admitting: Anesthesiology

## 2012-08-07 DIAGNOSIS — G8250 Quadriplegia, unspecified: Secondary | ICD-10-CM | POA: Insufficient documentation

## 2012-08-07 DIAGNOSIS — R82998 Other abnormal findings in urine: Secondary | ICD-10-CM | POA: Diagnosis not present

## 2012-08-07 DIAGNOSIS — N200 Calculus of kidney: Secondary | ICD-10-CM | POA: Diagnosis not present

## 2012-08-07 DIAGNOSIS — N3941 Urge incontinence: Secondary | ICD-10-CM | POA: Insufficient documentation

## 2012-08-07 DIAGNOSIS — N319 Neuromuscular dysfunction of bladder, unspecified: Secondary | ICD-10-CM | POA: Insufficient documentation

## 2012-08-07 HISTORY — PX: CYSTOSCOPY: SHX5120

## 2012-08-07 LAB — CBC
Hemoglobin: 15 g/dL (ref 13.0–17.0)
MCH: 29.5 pg (ref 26.0–34.0)
MCV: 83.7 fL (ref 78.0–100.0)
RBC: 5.09 MIL/uL (ref 4.22–5.81)

## 2012-08-07 SURGERY — CYSTOSCOPY
Anesthesia: General | Wound class: Clean Contaminated

## 2012-08-07 MED ORDER — ACETAMINOPHEN 10 MG/ML IV SOLN
INTRAVENOUS | Status: DC | PRN
  Administered 2012-08-07: 1000 mg via INTRAVENOUS

## 2012-08-07 MED ORDER — LACTATED RINGERS IV SOLN: INTRAVENOUS | Status: DC

## 2012-08-07 MED ORDER — LACTATED RINGERS IV SOLN
INTRAVENOUS | Status: DC
  Administered 2012-08-07: 1000 mL via INTRAVENOUS

## 2012-08-07 MED ORDER — MUPIROCIN 2 % EX OINT
TOPICAL_OINTMENT | Freq: Two times a day (BID) | CUTANEOUS | Status: DC
  Filled 2012-08-07: qty 22

## 2012-08-07 MED ORDER — PROPOFOL 10 MG/ML IV BOLUS
INTRAVENOUS | Status: DC | PRN
  Administered 2012-08-07: 200 mg via INTRAVENOUS

## 2012-08-07 MED ORDER — CIPROFLOXACIN IN D5W 400 MG/200ML IV SOLN
400.0000 mg | INTRAVENOUS | Status: AC
  Administered 2012-08-07: 400 mg via INTRAVENOUS

## 2012-08-07 MED ORDER — STERILE WATER FOR IRRIGATION IR SOLN: Status: DC | PRN

## 2012-08-07 MED ORDER — LIDOCAINE HCL (CARDIAC) 10 MG/ML IV SOLN
INTRAVENOUS | Status: DC | PRN
  Administered 2012-08-07: 75 mg via INTRAVENOUS

## 2012-08-07 MED ORDER — SODIUM CHLORIDE 0.9 % IR SOLN
Status: DC | PRN
  Administered 2012-08-07: 3000 mL

## 2012-08-07 MED ORDER — ONABOTULINUMTOXINA 100 UNITS IJ SOLR
INTRAMUSCULAR | Status: DC | PRN
  Administered 2012-08-07: 300 [IU] via INTRAMUSCULAR

## 2012-08-07 MED ORDER — ONABOTULINUMTOXINA 100 UNITS IJ SOLR
400.0000 [IU] | Freq: Once | INTRAMUSCULAR | Status: DC
  Filled 2012-08-07: qty 400

## 2012-08-07 MED ORDER — CIPROFLOXACIN IN D5W 400 MG/200ML IV SOLN
INTRAVENOUS | Status: AC
  Filled 2012-08-07: qty 200

## 2012-08-07 MED ORDER — PROMETHAZINE HCL 25 MG/ML IJ SOLN: 6.2500 mg | INTRAMUSCULAR | Status: DC | PRN

## 2012-08-07 MED ORDER — FENTANYL CITRATE 0.05 MG/ML IJ SOLN
INTRAMUSCULAR | Status: DC | PRN
  Administered 2012-08-07: 100 ug via INTRAVENOUS

## 2012-08-07 MED ORDER — ONDANSETRON HCL 4 MG/2ML IJ SOLN
INTRAMUSCULAR | Status: DC | PRN
  Administered 2012-08-07: 4 mg via INTRAVENOUS

## 2012-08-07 MED ORDER — ACETAMINOPHEN 10 MG/ML IV SOLN
INTRAVENOUS | Status: AC
  Filled 2012-08-07: qty 100

## 2012-08-07 MED ORDER — MIDAZOLAM HCL 5 MG/5ML IJ SOLN
INTRAMUSCULAR | Status: DC | PRN
  Administered 2012-08-07: 2 mg via INTRAVENOUS

## 2012-08-07 MED ORDER — SODIUM CHLORIDE 0.9 % IJ SOLN
INTRAMUSCULAR | Status: DC | PRN
  Administered 2012-08-07: 50 mL via INTRAVENOUS

## 2012-08-07 MED ORDER — HYDROMORPHONE HCL PF 1 MG/ML IJ SOLN: 0.2500 mg | INTRAMUSCULAR | Status: DC | PRN

## 2012-08-07 SURGICAL SUPPLY — 9 items
BAG URO CATCHER STRL LF (DRAPE) ×2 IMPLANT
CLOTH BEACON ORANGE TIMEOUT ST (SAFETY) ×2 IMPLANT
DRAPE CAMERA CLOSED 9X96 (DRAPES) ×2 IMPLANT
GLOVE BIOGEL M STRL SZ7.5 (GLOVE) ×2 IMPLANT
GOWN STRL NON-REIN LRG LVL3 (GOWN DISPOSABLE) ×2 IMPLANT
GOWN STRL REIN XL XLG (GOWN DISPOSABLE) ×2 IMPLANT
MANIFOLD NEPTUNE II (INSTRUMENTS) ×2 IMPLANT
PACK CYSTO (CUSTOM PROCEDURE TRAY) ×2 IMPLANT
TUBING CONNECTING 10 (TUBING) ×2 IMPLANT

## 2012-08-07 NOTE — Progress Notes (Signed)
Patient feels good and wants to go home.

## 2012-08-07 NOTE — Transfer of Care (Signed)
Immediate Anesthesia Transfer of Care Note  Patient: Ryan Watkins  Procedure(s) Performed: Procedure(s): CYSTOSCOPY WITH BOTOX INJECTION  (N/A)  Patient Location: PACU  Anesthesia Type:General  Level of Consciousness: awake, alert , oriented and patient cooperative  Airway & Oxygen Therapy: Patient Spontanous Breathing and Patient connected to face mask oxygen  Post-op Assessment: Report given to PACU RN and Post -op Vital signs reviewed and stable  Post vital signs: stable  Complications: No apparent anesthesia complications post op neuro status same as preop

## 2012-08-07 NOTE — Anesthesia Preprocedure Evaluation (Addendum)
Anesthesia Evaluation  Patient identified by MRN, date of birth, ID band Patient awake    Reviewed: Allergy & Precautions, H&P , NPO status , Patient's Chart, lab work & pertinent test results, reviewed documented beta blocker date and time   Airway Mallampati: I TM Distance: >3 FB Neck ROM: Full    Dental  (+) Teeth Intact   Pulmonary neg pulmonary ROS, asthma , neg pneumonia -,  breath sounds clear to auscultation        Cardiovascular negative cardio ROS  Rhythm:Regular Rate:Normal  Denies cardiac symptoms   Neuro/Psych Quadraplegia. C4. negative psych ROS   GI/Hepatic negative GI ROS, Neg liver ROS,   Endo/Other  negative endocrine ROS  Renal/GU negative Renal ROS   Neurogenic bladder    Musculoskeletal negative musculoskeletal ROS (+)   Abdominal   Peds  Hematology negative hematology ROS (+)   Anesthesia Other Findings Previous trach. closed  Reproductive/Obstetrics negative OB ROS                          Anesthesia Physical Anesthesia Plan  ASA: III  Anesthesia Plan: General   Post-op Pain Management:    Induction: Intravenous  Airway Management Planned: LMA  Additional Equipment:   Intra-op Plan:   Post-operative Plan: Extubation in OR  Informed Consent: I have reviewed the patients History and Physical, chart, labs and discussed the procedure including the risks, benefits and alternatives for the proposed anesthesia with the patient or authorized representative who has indicated his/her understanding and acceptance.   Dental advisory given  Plan Discussed with: CRNA  Anesthesia Plan Comments:        Anesthesia Quick Evaluation

## 2012-08-07 NOTE — Anesthesia Postprocedure Evaluation (Signed)
Anesthesia Post Note  Patient: Ryan Watkins  Procedure(s) Performed: Procedure(s) (LRB): CYSTOSCOPY WITH BOTOX INJECTION  (N/A)  Anesthesia type: General  Patient location: PACU  Post pain: Pain level controlled  Post assessment: Post-op Vital signs reviewed  Last Vitals:  Filed Vitals:   08/07/12 1130  BP: 92/51  Pulse: 72  Temp:   Resp: 13    Post vital signs: Reviewed  Level of consciousness: sedated  Complications: No apparent anesthesia complications

## 2012-08-07 NOTE — Op Note (Signed)
Preoperative diagnosis: Neurogenic bladder Postoperative diagnosis: Neurogenic bladder  Procedure: Cystoscopy Botox injection 300 units  Surgeon: Mena Goes  Anesthesia: Gen.  Findings the bladder appeared normal with a normal urethra and prostate. The ureteral orifices and trigone were normal. There was good reflux of clear urine. The bladder mucosa was normal without lesion or mass. There was no stone or foreign body in the bladder.  Description of procedure: After consent was obtained patient brought to the operating room. After adequate anesthesia he is placed in lithotomy position and prepped and draped in the usual sterile fashion. A timeout was performed to confirm the patient and procedure. A cystoscope was passed per urethra and the bladder drained and irrigated with clean fluid and partially distended.  A total of 300 units of Botox was injected by diluting a 100 unit per vial / 10 ml to create a 10 unit per cc injection. This was done x3. 10 injections were placed above the trigone on the right bladder wall. 10 injections on the left bladder wall. 10 injections in the midline.  Hemostasis was good with the bladder drained. The scope was removed and the patient was awakened taken to recovery room in stable condition.  Complications: None  Blood loss: Minimal  Specimens: None

## 2012-08-07 NOTE — Interval H&P Note (Signed)
History and Physical Interval Note:  08/07/2012 9:50 AM  Ryan Watkins  has presented today for surgery, with the diagnosis of NEUROGENIC BLADDER   The various methods of treatment have been discussed with the patient and family. After consideration of risks, benefits and other options for treatment, the patient has consented to  Procedure(s): CYSTOSCOPY WITH BOTOX INJECTION  (N/A) as a surgical intervention .  The patient's history has been reviewed, patient examined, no change in status, stable for surgery.  I have reviewed the patient's chart and labs.  Questions were answered to the patient's satisfaction.  Pt without fever. Started Cipro yesterday and took dose this AM.    [name]

## 2012-08-10 ENCOUNTER — Encounter (HOSPITAL_COMMUNITY): Payer: Self-pay | Admitting: Urology

## 2013-06-15 ENCOUNTER — Other Ambulatory Visit: Payer: Self-pay | Admitting: Urology

## 2013-06-18 ENCOUNTER — Encounter (HOSPITAL_COMMUNITY): Payer: Self-pay | Admitting: *Deleted

## 2013-06-21 ENCOUNTER — Encounter (HOSPITAL_COMMUNITY): Payer: Self-pay | Admitting: Pharmacy Technician

## 2013-06-26 NOTE — H&P (Signed)
History of Present Illness            F/u -     1- NGB - C4 quadraplegia. He had bladder stones a few years ago. He does CIC with the help of his mother QID.  -Oct 2012 US showed no hydronephrosis and 111 mL in bladder. He was changed to [facility] last visit from [facility] to help with urge incontinence and autonomic dysreflexia. He tried oxybutynin and had "worse cotton mouth" he's ever had. 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 If he caths he will get wet after about 1-2 hours but sometimes within an hour. He is wet at night. He gets "a wierd feeling, his blood pressure does something funny", he gets sweaty with goose bumps when he leaks. He wears condom catheter. This is bothersome to him.  -Feb 2013 - cystoscopy normal  -Feb 2013 - UDS - showed a small capacity, hyperreflexic bladder with moderate incontinence. His capacity was 150 mL. His DLPP was 43-49 cm/H20. He voided by involuntary contraction with a slow flow and an elevated postvoid residual. EMG was normal but this may be artifact.  -Mar 2013 - Botox   -Aug 2013 - UTI and admission  -Oct 2013 Botox 300U injection   -Dec 2013 Renal US - normal apart from poss 5 mm and 4 mm right renal stone  -Apr 2014 Botox 300U  -Jan 2015 - urine cx negative. I ordered ceftriaxone botox preop based on his last culture. Plan Botox.     2-  Nephrolithiasis  -Dec 2013 5 mm and 4 mm shadowing poss stones  -Apr 2014 KUB 11 x 4 calc over right kidney  -Jul 2014 (today) - KUB stable RLP calc - [place] [facility]    he got married May 2014.         FEB 2015  Interval Hx  Ryan NajjarLarry returns for an appointment that was already scheduled prior to me seeing him last week. His urine culture is negative. I discussed this with him. He is due for renal ultrasound to evaluate the upper tracts as well as the right lower pole stone.     Renal ultrasound today-I reviewed all the images, please see the technician sheet for  details, findings: The kidneys are normal in appearance without masses or hydronephrosis. The right lower pole contains an 8 mm hyperechoic area consistent with the prior stent. The bladder appears normal. The post void is 194 cc, but the patient catheterized a few hours ago. No ureters are seen. Comparison to KUB July 2014.         Past Medical History Problems  1. 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 History of Bladder Cystotomy With Direct Removal Of Calculus 2. History of Cervical Vertebral Fusion 3. History of Cosmetic Surgery (V50.1) 4. History of Neck Surgery 5. History of Oral Surgery Tooth Extraction 6. History of Surgical Flaps 7. History of Urologic Surgery 8. History of Urologic Surgery 9. History of Urologic Surgery 10. History of Wrist Surgery  Current Meds 1. Advil TABS;  Therapy: (Recorded:23Oct2008) to Recorded 2. Allergy TABS;  Therapy: (Recorded:23Oct2008) to Recorded 3. Fluticasone Propionate 50 MCG/ACT Nasal Suspension;  Therapy: (Recorded:31Mar2014) to Recorded 4. Symbicort AERO;  Therapy: (Recorded:30Jul2014) to Recorded  Allergies Medication  1. Morphine Derivatives  Family History Problems  1. Family history of Brain Cancer (V16.8) : Maternal Grandfather 2. Family history of Cervical Cancer : Mother 3. Family history of Heart Disease (V17.49) : Father 4.  Family history of Heart Disease (V17.49) : Paternal Grandfather 5. Family history of Lung Cancer (V16.1) : Mother  Social History Problems  1. Denied: Alcohol Use 2. Caffeine Use   2-3 can drinks per day 3. Marital History - Single 4. Never A Smoker 5. Denied: Tobacco Use  Vitals Vital Signs [Data Includes: Last 1 Day]  Recorded: 09Feb2015 11:13AM  Blood Pressure: 114 / 77 Temperature: 98.2 F Heart Rate: 73  Results/Data Urine [Data Includes: Last 1 Day]   09Feb2015  COLOR YELLOW   APPEARANCE CLOUDY   SPECIFIC GRAVITY 1.015   pH 6.0   GLUCOSE NEG  mg/dL  BILIRUBIN NEG   KETONE NEG mg/dL  BLOOD MOD   PROTEIN TRACE mg/dL  UROBILINOGEN 0.2 mg/dL  NITRITE NEG   LEUKOCYTE ESTERASE MOD   SQUAMOUS EPITHELIAL/HPF NONE SEEN   WBC 11-20 WBC/hpf  RBC NONE SEEN RBC/hpf  BACTERIA MODERATE   CRYSTALS NONE SEEN   CASTS NONE SEEN    Assessment Assessed  1. Neurogenic bladder (596.54) 2. Urge incontinence of urine (788.31) 3. Nephrolithiasis (592.0)  Plan Health Maintenance  1. UA With REFLEX; [Do Not Release]; Status:Resulted - Requires Verification;   Done:  09Feb2015 10:33AM  Discussion/Summary Nephrlothiasis - he asked about URS during the botox which is a reasonable consideration. We discussed it appears the stone is stable but at a good size to be difficult to pass and a good size to tx with the URS. We discussed nature, r/b of continued surveillance, URS/laser/stent and ESWL. Given his high spinal level he'd need general for ESWL and it might be harder to clear and drain fragments. On the other hand we discussed URS is more invasive and might need to be staged/pre-stent. He wants to proceed with URS and Botox - I'll add it to the botox green sheet. He's here with his mother.      Signatures Electronically signed by : [name], M.D.; Jun 14 2013 12:12PM EST   Add: recent Urine Cx in office was negative

## 2013-06-29 ENCOUNTER — Encounter (HOSPITAL_COMMUNITY): Payer: Medicare Other | Admitting: Anesthesiology

## 2013-06-29 ENCOUNTER — Ambulatory Visit (HOSPITAL_COMMUNITY): Payer: Medicare Other | Admitting: Anesthesiology

## 2013-06-29 ENCOUNTER — Encounter (HOSPITAL_COMMUNITY)
Admission: RE | Disposition: A | Discharge status: Home / Self Care | Payer: Self-pay | Source: Ambulatory Visit | Attending: Urology

## 2013-06-29 ENCOUNTER — Ambulatory Visit (HOSPITAL_COMMUNITY)
Admission: RE | Admit: 2013-06-29 | Discharge: 2013-06-29 | Disposition: A | Discharge status: Home / Self Care | Payer: Medicare Other | Source: Ambulatory Visit | Attending: Urology | Admitting: Urology

## 2013-06-29 ENCOUNTER — Encounter (HOSPITAL_COMMUNITY): Payer: Self-pay | Admitting: *Deleted

## 2013-06-29 DIAGNOSIS — N319 Neuromuscular dysfunction of bladder, unspecified: Secondary | ICD-10-CM | POA: Insufficient documentation

## 2013-06-29 DIAGNOSIS — N3941 Urge incontinence: Secondary | ICD-10-CM | POA: Insufficient documentation

## 2013-06-29 DIAGNOSIS — G904 Autonomic dysreflexia: Secondary | ICD-10-CM | POA: Insufficient documentation

## 2013-06-29 DIAGNOSIS — J45909 Unspecified asthma, uncomplicated: Secondary | ICD-10-CM | POA: Insufficient documentation

## 2013-06-29 DIAGNOSIS — G8250 Quadriplegia, unspecified: Secondary | ICD-10-CM | POA: Insufficient documentation

## 2013-06-29 DIAGNOSIS — N200 Calculus of kidney: Secondary | ICD-10-CM | POA: Insufficient documentation

## 2013-06-29 HISTORY — PX: CYSTOSCOPY WITH URETEROSCOPY AND STENT PLACEMENT: SHX6377

## 2013-06-29 HISTORY — PX: BOTOX INJECTION: SHX5754

## 2013-06-29 LAB — BASIC METABOLIC PANEL
BUN: 12 mg/dL (ref 6–23)
CO2: 25 mEq/L (ref 19–32)
Calcium: 9.6 mg/dL (ref 8.4–10.5)
Chloride: 102 mEq/L (ref 96–112)
Creatinine, Ser: 0.49 mg/dL — ABNORMAL LOW (ref 0.50–1.35)
Glucose, Bld: 100 mg/dL — ABNORMAL HIGH (ref 70–99)
POTASSIUM: 3.9 meq/L (ref 3.7–5.3)
SODIUM: 140 meq/L (ref 137–147)

## 2013-06-29 SURGERY — CYSTOURETEROSCOPY, WITH STENT INSERTION
Anesthesia: General | Laterality: Right

## 2013-06-29 MED ORDER — MIDAZOLAM HCL 2 MG/2ML IJ SOLN
INTRAMUSCULAR | Status: AC
  Filled 2013-06-29: qty 2

## 2013-06-29 MED ORDER — ONABOTULINUMTOXINA 100 UNITS IJ SOLR
200.0000 [IU] | Freq: Once | INTRAMUSCULAR | Status: DC
  Filled 2013-06-29: qty 200

## 2013-06-29 MED ORDER — PROPOFOL 10 MG/ML IV BOLUS
INTRAVENOUS | Status: AC
  Filled 2013-06-29: qty 20

## 2013-06-29 MED ORDER — LACTATED RINGERS IV SOLN
INTRAVENOUS | Status: DC
  Administered 2013-06-29: 1000 mL via INTRAVENOUS

## 2013-06-29 MED ORDER — DEXTROSE 5 % IV SOLN
2.0000 g | Freq: Once | INTRAVENOUS | Status: AC
  Administered 2013-06-29: 2 g via INTRAVENOUS
  Filled 2013-06-29: qty 2

## 2013-06-29 MED ORDER — IOHEXOL 300 MG/ML  SOLN
INTRAMUSCULAR | Status: DC | PRN
  Administered 2013-06-29: 4 mL

## 2013-06-29 MED ORDER — FENTANYL CITRATE 0.05 MG/ML IJ SOLN
INTRAMUSCULAR | Status: AC
  Filled 2013-06-29: qty 2

## 2013-06-29 MED ORDER — SODIUM CHLORIDE 0.9 % IJ SOLN
INTRAMUSCULAR | Status: AC
  Filled 2013-06-29: qty 40

## 2013-06-29 MED ORDER — PROPOFOL 10 MG/ML IV BOLUS
INTRAVENOUS | Status: DC | PRN
  Administered 2013-06-29: 200 mg via INTRAVENOUS

## 2013-06-29 MED ORDER — ONDANSETRON HCL 4 MG/2ML IJ SOLN
INTRAMUSCULAR | Status: DC | PRN
  Administered 2013-06-29: 4 mg via INTRAVENOUS

## 2013-06-29 MED ORDER — ONABOTULINUMTOXINA 100 UNITS IJ SOLR
INTRAMUSCULAR | Status: DC | PRN
  Administered 2013-06-29: 300 [IU] via INTRAMUSCULAR

## 2013-06-29 MED ORDER — ONDANSETRON HCL 4 MG/2ML IJ SOLN
INTRAMUSCULAR | Status: AC
  Filled 2013-06-29: qty 2

## 2013-06-29 MED ORDER — SODIUM CHLORIDE 0.9 % IR SOLN
Status: DC | PRN
  Administered 2013-06-29: 4000 mL via INTRAVESICAL

## 2013-06-29 MED ORDER — MIDAZOLAM HCL 5 MG/5ML IJ SOLN
INTRAMUSCULAR | Status: DC | PRN
  Administered 2013-06-29: 2 mg via INTRAVENOUS

## 2013-06-29 MED ORDER — FENTANYL CITRATE 0.05 MG/ML IJ SOLN
INTRAMUSCULAR | Status: DC | PRN
  Administered 2013-06-29 (×2): 50 ug via INTRAVENOUS

## 2013-06-29 SURGICAL SUPPLY — 20 items
BAG URO CATCHER STRL LF (DRAPE) ×4 IMPLANT
CATH INTERMIT  6FR 70CM (CATHETERS) ×4 IMPLANT
CATH URET 5FR 28IN CONE TIP (BALLOONS)
CATH URET 5FR 70CM CONE TIP (BALLOONS) IMPLANT
CATH URET DUAL LUMEN 6-10FR 50 (CATHETERS) ×4 IMPLANT
CLOTH BEACON ORANGE TIMEOUT ST (SAFETY) ×4 IMPLANT
CONT SPEC 4OZ CLIKSEAL STRL BL (MISCELLANEOUS) ×4 IMPLANT
DRAPE CAMERA CLOSED 9X96 (DRAPES) ×4 IMPLANT
GLOVE BIO SURGEON STRL SZ7.5 (GLOVE) ×4 IMPLANT
GLOVE BIOGEL M STRL SZ7.5 (GLOVE) ×4 IMPLANT
GOWN STRL REUS W/TWL XL LVL3 (GOWN DISPOSABLE) ×4 IMPLANT
GUIDEWIRE ANG ZIPWIRE 038X150 (WIRE) ×4 IMPLANT
GUIDEWIRE STR DUAL SENSOR (WIRE) ×4 IMPLANT
MANIFOLD NEPTUNE II (INSTRUMENTS) ×4 IMPLANT
PACK CYSTO (CUSTOM PROCEDURE TRAY) ×4 IMPLANT
SHEATH ACCESS URETERAL 38CM (SHEATH) ×4 IMPLANT
STENT CONTOUR 6FRX28X.038 (STENTS) ×4 IMPLANT
TUBING CONNECTING 10 (TUBING) ×3 IMPLANT
TUBING CONNECTING 10' (TUBING) ×1
WIRE COONS/BENSON .038X145CM (WIRE) IMPLANT

## 2013-06-29 NOTE — Preoperative (Signed)
Beta Blockers   Reason not to administer Beta Blockers:Not Applicable 

## 2013-06-29 NOTE — Discharge Instructions (Signed)
Ureteral Stent Implantation Ureteral stent implantation is the implantation of a soft plastic tube with multiple holes into the tube that drains urine from your kidney to your bladder (ureter). The stent helps drain your kidney when there is a blockage of the flow of urine in your ureter. The stent has a coil on each end to keep it from falling out. One end stays in the kidney. The other end stays in the bladder. It is most often taken out after any blockage has been removed or your ureter has healed. Short-term stents have a string attached to make removal quite easy. Removal of a short-term stent can be done in your health care provider's office or by you at home. Long-term stents need to be changed every few months. LET [facility] PROVIDER KNOW ABOUT:  Any allergies you have.  All medicines you are taking, including vitamins, herbs, eye drops, creams, and over-the-counter medicines.  Previous problems you or members of your family have had with the use of anesthetics.  Any blood disorders you have.  Previous surgeries you have had.  Medical conditions you have. RISKS AND COMPLICATIONS Generally, ureteral stent implantation is a safe procedure. However, as with any procedure, complications can occur. Possible complications include:  Movement of the stent away from where it was originally placed (migration). This may affect the ability of the stent to properly drain your kidney. If migration of the stent occurs, the stent may need to be replaced or repositioned.  Perforation of the ureter.  Infection. BEFORE THE PROCEDURE  You may be asked to wash your genital area with sterile soap the morning of your procedure.  You may be given an oral antibiotic which you should take with a sip of water as prescribed by your health care provider.  You may be asked to not eat or drink for 8 hours before the surgery. PROCEDURE  First you will be given an anesthetic so you do not feel pain  during the procedure.  Your health care provider will insert a special lighted instrument called a cystoscope into your bladder. This allows your health care provider to see the opening to your ureter.  A thin wire is carefully threaded into your bladder and up the ureter. The stent is inserted over the wire and the wire is then removed.  Your bladder will be emptied of urine. AFTER THE PROCEDURE You will be taken to a recovery room until it is okay for you to go home. Document Released: 04/19/2000 Document Revised: 12/23/2012 Document Reviewed: 09/29/2012 [facility] Patient Information 2014 [place] [place].  Cystoscopy, Care After Refer to this sheet in the next few weeks. These instructions provide you with information on caring for yourself after your procedure. Your caregiver may also give you more specific instructions. Your treatment has been planned according to current medical practices, but problems sometimes occur. Call your caregiver if you have any problems or questions after your procedure. HOME CARE INSTRUCTIONS  Things you can do to ease any discomfort after your procedure include:  Drinking enough water and fluids to keep your urine clear or pale yellow.  Taking a warm bath to relieve any burning feelings. SEEK IMMEDIATE MEDICAL CARE IF:   You have an increase in blood in your urine.  You notice blood clots in your urine.  You have difficulty passing urine.  You have the chills.  You have abdominal pain.  You have a fever or persistent symptoms for more than 2 3 days.  You  have a fever and your symptoms suddenly get worse. MAKE SURE YOU:   Understand these instructions.  Will watch your condition.  Will get help right away if you are not doing well or get worse. Document Released: 11/09/2004 Document Revised: 12/23/2012 Document Reviewed: 10/14/2011 [facility] Patient Information 2014 [place], [place].  OnabotulinumtoxinA injection (Medical Use) What is this  medicine? ONABOTULINUMTOXINA (o na BOTT you lye num tox in eh) is a neuro-muscular blocker. This medicine is used to treat crossed eyes, eyelid spasms, severe neck muscle spasms, and elbow, wrist, and finger muscle spasms. It is also used to treat excessive underarm sweating, to prevent chronic migraine headaches, and to treat loss of bladder control due to neurologic conditions such as multiple sclerosis or spinal cord injury. This medicine may be used for other purposes; ask your health care provider or pharmacist if you have questions. COMMON BRAND NAME(S): Botox  What side effects may I notice from receiving this medicine? Side effects that you should report to your doctor or health care professional as soon as possible: -breathing problems -changes in vision -chest pain or tightness -fast, irregular heartbeat -speech problems -swallowing problems     2014, Elsevier/Gold Standard. (2012-01-20 17:30:24)

## 2013-06-29 NOTE — Op Note (Addendum)
Preop diagnosis: Right renal stone Neurogenic bladder Incontinence  Postop diagnosis: Same  Procedure: Right retrograde pyelogram Right ureteroscopy Right ureteral stent placement Cystoscopy with Botox injection, 300 units  Surgeon: Mena GoesEskridge  Anesthesia: Fortune  Type of anesthesia: Gen.  Findings: Scout imaging - The stone was visible on scout imaging in the region of the right kidney.  Right retrograde pyelogram - this revealed a single ureter single collecting system unit. The stone was located in the right midpole infundibulum or calyx appearing as a subtle filling defect. Otherwise the collecting system, renal pelvis, ureter was normal without filling defect dilation or stricture.  Right ureteroscopy - the right proximal ureter was tight and began to buckle and tightened around the ureteroscope. I tried to carefully passively dilate the ureter with the irrigation and the scope but it would not pass and I did not force it. I thought it best to leave the stent and come back for stone treatment.  Cystoscopy - the urethra, prostatic urethra, trigone and ureteral orifices and the bladder were all normal. The bladder mucosa was normal. There were no  stones or foreign bodies in the bladder.  Description of procedure: After consent was obtained patient brought to the operating room and placed in lithotomy position. He was prepped and draped in the usual sterile fashion. A timeout was performed to confirm the patient and procedure. Cystoscope was passed per urethra and the bladder triple rinsed with irrigation. A 6 French open-ended catheter was used to cannulate the right ureteral orifice and retrograde injection of contrast was performed with several spot imaging. Through the 6 [place] open-ended catheter a sensor wire was passed. Over the sensor wire a dual-lumen exchange catheter was advanced into the proximal ureter without difficulty. The Glidewire was then advanced and coiled in the  kidney. The dual-lumen was removed leaving both wires.  Over the sensor wire a medium access sheath was advanced without difficulty. I could only get the access sheath to advance just to the level of the iliacs and into the mid ureter. The sensor wire and inner cannula were removed.   A digital ureteroscope was advanced up over the iliacs and into the proximal ureter but would not pass. The ureter was simply too tight.   Therefore the access sheath was backed out on the cystoscope and the ureter carefully inspected. It was noted to be normal without injury.   The Glidewire was backloaded on the cystoscope and a 6 x 28 cm stent was advanced. A good coil reconstituted in the right lower pole and a good coil in the bladder.   The cystoscope was removed and the injection scope was passed. 100 units of Botox was diluted into 10 cc for a final concentration of 10 units per cc. This was done x3 for a total of 300 units. Ten 1 cc injections were placed on the right side of the bladder above the right UO and trigone. Ten 1 cc injections were placed above the trigone in the midline. Ten 1 cc injections were placed along the left bladder wall above the left UO and trigone. All the injections were placed in the grid pattern. There was excellent hemostasis at low-pressure. The scope was removed.  The patient awakened taken to recovery room in stable condition.   Complications: None Specimens: None Blood loss: Minimal  Drains: Right 6 x 28 cm ureteral stent

## 2013-06-29 NOTE — Transfer of Care (Signed)
Immediate Anesthesia Transfer of Care Note  Patient: Ryan AddisonLarry J Watkins  Procedure(s) Performed: Procedure(s) (LRB): CYSTOSCOPY WITH RETROGRADE, URETEROSCOPY AND STENT PLACEMENT ON RIGHT (Right) BOTOX INJECTION (N/A)  Patient Location: PACU  Anesthesia Type: General  Level of Consciousness: sedated, patient cooperative and responds to stimulation  Airway & Oxygen Therapy: Patient Spontanous Breathing and Patient connected to face mask oxgen  Post-op Assessment: Report given to PACU RN and Post -op Vital signs reviewed and stable  Post vital signs: Reviewed and stable  Complications: No apparent anesthesia complications

## 2013-06-29 NOTE — Interval H&P Note (Signed)
History and Physical Interval Note:  06/29/2013 11:38 AM  Ryan Watkins  has presented today for surgery, with the diagnosis of Neurogenic Bladder, Right Nephrolithiasis  The various methods of treatment have been discussed with the patient and family. After consideration of risks, benefits and other options for treatment, the patient has consented to  Procedure(s): CYSTOSCOPY WITH URETEROSCOPY AND STENT PLACEMENT (Right) BOTOX INJECTION (N/A) HOLMIUM LASER APPLICATION (Right) as a surgical intervention .  The patient's history has been reviewed, patient examined, no change in status, stable for surgery.  I have reviewed the patient's chart and labs.  Questions were answered to the patient's satisfaction.     Antony HasteEskridge, Kortlyn Koltz Ramsey

## 2013-06-29 NOTE — Anesthesia Preprocedure Evaluation (Signed)
Anesthesia Evaluation  Patient identified by MRN, date of birth, ID band Patient awake    Reviewed: Allergy & Precautions, H&P , NPO status , Patient's Chart, lab work & pertinent test results, reviewed documented beta blocker date and time   Airway Mallampati: I TM Distance: >3 FB Neck ROM: Full    Dental  (+) Teeth Intact   Pulmonary neg pulmonary ROS, asthma , pneumonia -,  breath sounds clear to auscultation        Cardiovascular negative cardio ROS  Rhythm:Regular Rate:Normal  Denies cardiac symptoms   Neuro/Psych C4 Quadriplegia negative psych ROS   GI/Hepatic negative GI ROS, Neg liver ROS,   Endo/Other  negative endocrine ROS  Renal/GU negative Renal ROS   Neurogenic bladder    Musculoskeletal negative musculoskeletal ROS (+)   Abdominal   Peds  Hematology negative hematology ROS (+)   Anesthesia Other Findings Previous trach. closed  Reproductive/Obstetrics                           Anesthesia Physical Anesthesia Plan  ASA: III  Anesthesia Plan: General   Post-op Pain Management:    Induction: Intravenous  Airway Management Planned: LMA  Additional Equipment:   Intra-op Plan:   Post-operative Plan: Extubation in OR  Informed Consent: I have reviewed the patients History and Physical, chart, labs and discussed the procedure including the risks, benefits and alternatives for the proposed anesthesia with the patient or authorized representative who has indicated his/her understanding and acceptance.   Dental advisory given  Plan Discussed with: CRNA  Anesthesia Plan Comments:         Anesthesia Quick Evaluation                                  Anesthesia Evaluation  Patient identified by MRN, date of birth, ID band Patient awake    Reviewed: Allergy & Precautions, H&P , NPO status , Patient's Chart, lab work & pertinent test results, reviewed documented  beta blocker date and time   Airway Mallampati: I TM Distance: >3 FB Neck ROM: Full    Dental  (+) Teeth Intact   Pulmonary neg pulmonary ROS, asthma , neg pneumonia -,  breath sounds clear to auscultation        Cardiovascular negative cardio ROS  Rhythm:Regular Rate:Normal  Denies cardiac symptoms   Neuro/Psych Quadraplegia. C4. negative psych ROS   GI/Hepatic negative GI ROS, Neg liver ROS,   Endo/Other  negative endocrine ROS  Renal/GU negative Renal ROS   Neurogenic bladder    Musculoskeletal negative musculoskeletal ROS (+)   Abdominal   Peds negative pediatric ROS (+)  Hematology negative hematology ROS (+)   Anesthesia Other Findings Previous trach. closed  Reproductive/Obstetrics negative OB ROS                           Anesthesia Physical  Anesthesia Plan  ASA: III  Anesthesia Plan: General   Post-op Pain Management:    Induction:   Airway Management Planned: LMA  Additional Equipment:   Intra-op Plan:   Post-operative Plan: Extubation in OR  Informed Consent: I have reviewed the patients History and Physical, chart, labs and discussed the procedure including the risks, benefits and alternatives for the proposed anesthesia with the patient or authorized representative who has indicated his/her understanding and acceptance.  Plan Discussed with: CRNA and Surgeon  Anesthesia Plan Comments:         Anesthesia Quick Evaluation                                   Anesthesia Evaluation  Patient identified by MRN, date of birth, ID band Patient awake    Reviewed: Allergy & Precautions, H&P , NPO status , Patient's Chart, lab work & pertinent test results, reviewed documented beta blocker date and time   Airway Mallampati: I TM Distance: >3 FB Neck ROM: Full    Dental  (+) Teeth Intact   Pulmonary neg pulmonary ROS, asthma , neg pneumonia -,  breath sounds clear to  auscultation        Cardiovascular negative cardio ROS  Rhythm:Regular Rate:Normal  Denies cardiac symptoms   Neuro/Psych Quadraplegia. C4. negative psych ROS   GI/Hepatic negative GI ROS, Neg liver ROS,   Endo/Other  negative endocrine ROS  Renal/GU negative Renal ROS   Neurogenic bladder    Musculoskeletal negative musculoskeletal ROS (+)   Abdominal   Peds  Hematology negative hematology ROS (+)   Anesthesia Other Findings Previous trach. closed  Reproductive/Obstetrics negative OB ROS                          Anesthesia Physical Anesthesia Plan  ASA: III  Anesthesia Plan: General   Post-op Pain Management:    Induction: Intravenous  Airway Management Planned: LMA  Additional Equipment:   Intra-op Plan:   Post-operative Plan: Extubation in OR  Informed Consent: I have reviewed the patients History and Physical, chart, labs and discussed the procedure including the risks, benefits and alternatives for the proposed anesthesia with the patient or authorized representative who has indicated his/her understanding and acceptance.   Dental advisory given  Plan Discussed with: CRNA  Anesthesia Plan Comments:        Anesthesia Quick Evaluation

## 2013-06-29 NOTE — Anesthesia Postprocedure Evaluation (Signed)
  Anesthesia Post-op Note  Patient: Ryan Watkins  Procedure(s) Performed: Procedure(s) (LRB): CYSTOSCOPY WITH RETROGRADE, URETEROSCOPY AND STENT PLACEMENT ON RIGHT (Right) BOTOX INJECTION (N/A)  Patient Location: PACU  Anesthesia Type: General  Level of Consciousness: awake and alert   Airway and Oxygen Therapy: Patient Spontanous Breathing  Post-op Pain: mild  Post-op Assessment: Post-op Vital signs reviewed, Patient's Cardiovascular Status Stable, Respiratory Function Stable, Patent Airway and No signs of Nausea or vomiting  Last Vitals:  Filed Vitals:   06/29/13 1413  BP: 121/83  Pulse: 70  Temp: 36.5 C  Resp: 16    Post-op Vital Signs: stable   Complications: No apparent anesthesia complications

## 2013-06-30 ENCOUNTER — Encounter (HOSPITAL_COMMUNITY): Payer: Self-pay | Admitting: Urology

## 2013-07-07 ENCOUNTER — Encounter (HOSPITAL_COMMUNITY): Payer: Self-pay | Admitting: *Deleted

## 2013-07-07 ENCOUNTER — Other Ambulatory Visit: Payer: Self-pay | Admitting: Urology

## 2013-07-07 NOTE — Progress Notes (Signed)
Preop instructions completed with mother, Ocie DoyneLinda Martelle.  [facility]

## 2013-07-07 NOTE — Progress Notes (Signed)
Need orders in EPIC.  Surgery scheduled for 07/13/13.  Thank You.

## 2013-07-12 ENCOUNTER — Encounter (HOSPITAL_COMMUNITY): Payer: Self-pay | Admitting: Pharmacy Technician

## 2013-07-12 NOTE — Progress Notes (Signed)
Need orders in EPIC.  Surgery on 07/13/13.  Thank You.

## 2013-07-13 ENCOUNTER — Ambulatory Visit (HOSPITAL_COMMUNITY): Payer: Medicare Other | Admitting: *Deleted

## 2013-07-13 ENCOUNTER — Encounter (HOSPITAL_COMMUNITY): Payer: Medicare Other | Admitting: *Deleted

## 2013-07-13 ENCOUNTER — Encounter (HOSPITAL_COMMUNITY): Payer: Self-pay | Admitting: *Deleted

## 2013-07-13 ENCOUNTER — Encounter (HOSPITAL_COMMUNITY)
Admission: RE | Disposition: A | Discharge status: Home / Self Care | Payer: Self-pay | Source: Ambulatory Visit | Attending: Urology

## 2013-07-13 ENCOUNTER — Ambulatory Visit (HOSPITAL_COMMUNITY)
Admission: RE | Admit: 2013-07-13 | Discharge: 2013-07-13 | Disposition: A | Discharge status: Home / Self Care | Payer: Medicare Other | Source: Ambulatory Visit | Attending: Urology | Admitting: Urology

## 2013-07-13 DIAGNOSIS — G8250 Quadriplegia, unspecified: Secondary | ICD-10-CM | POA: Insufficient documentation

## 2013-07-13 DIAGNOSIS — G904 Autonomic dysreflexia: Secondary | ICD-10-CM | POA: Insufficient documentation

## 2013-07-13 DIAGNOSIS — N3941 Urge incontinence: Secondary | ICD-10-CM | POA: Insufficient documentation

## 2013-07-13 DIAGNOSIS — N200 Calculus of kidney: Secondary | ICD-10-CM | POA: Insufficient documentation

## 2013-07-13 DIAGNOSIS — R030 Elevated blood-pressure reading, without diagnosis of hypertension: Secondary | ICD-10-CM | POA: Insufficient documentation

## 2013-07-13 DIAGNOSIS — Z79899 Other long term (current) drug therapy: Secondary | ICD-10-CM | POA: Insufficient documentation

## 2013-07-13 HISTORY — PX: CYSTOSCOPY WITH RETROGRADE PYELOGRAM, URETEROSCOPY AND STENT PLACEMENT: SHX5789

## 2013-07-13 HISTORY — PX: HOLMIUM LASER APPLICATION: SHX5852

## 2013-07-13 SURGERY — CYSTOURETEROSCOPY, WITH RETROGRADE PYELOGRAM AND STENT INSERTION
Anesthesia: General | Laterality: Right

## 2013-07-13 MED ORDER — HYDRALAZINE HCL 20 MG/ML IJ SOLN
INTRAMUSCULAR | Status: AC
  Filled 2013-07-13: qty 1

## 2013-07-13 MED ORDER — FENTANYL CITRATE 0.05 MG/ML IJ SOLN
25.0000 ug | INTRAMUSCULAR | Status: DC | PRN
  Administered 2013-07-13: 25 ug via INTRAVENOUS

## 2013-07-13 MED ORDER — METOCLOPRAMIDE HCL 5 MG/ML IJ SOLN
INTRAMUSCULAR | Status: AC
  Filled 2013-07-13: qty 2

## 2013-07-13 MED ORDER — DEXAMETHASONE SODIUM PHOSPHATE 4 MG/ML IJ SOLN
INTRAMUSCULAR | Status: DC | PRN
  Administered 2013-07-13: 10 mg via INTRAVENOUS

## 2013-07-13 MED ORDER — FENTANYL CITRATE 0.05 MG/ML IJ SOLN
INTRAMUSCULAR | Status: AC
  Filled 2013-07-13: qty 2

## 2013-07-13 MED ORDER — PROPOFOL 10 MG/ML IV BOLUS
INTRAVENOUS | Status: AC
  Filled 2013-07-13: qty 20

## 2013-07-13 MED ORDER — SODIUM CHLORIDE 0.9 % IR SOLN
Status: DC | PRN
  Administered 2013-07-13: 3000 mL

## 2013-07-13 MED ORDER — METOCLOPRAMIDE HCL 5 MG/ML IJ SOLN
INTRAMUSCULAR | Status: DC | PRN
  Administered 2013-07-13: 10 mg via INTRAVENOUS

## 2013-07-13 MED ORDER — MEPERIDINE HCL 50 MG/ML IJ SOLN
6.2500 mg | INTRAMUSCULAR | Status: DC | PRN
Start: 2013-07-13 — End: 2013-07-15

## 2013-07-13 MED ORDER — DEXTROSE 5 % IV SOLN
1.0000 g | Freq: Once | INTRAVENOUS | Status: AC
  Administered 2013-07-13: 1 g via INTRAVENOUS
  Filled 2013-07-13: qty 10

## 2013-07-13 MED ORDER — 0.9 % SODIUM CHLORIDE (POUR BTL) OPTIME
TOPICAL | Status: DC | PRN
  Administered 2013-07-13: 1000 mL

## 2013-07-13 MED ORDER — CIPROFLOXACIN HCL 500 MG PO TABS: 500.0000 mg | ORAL_TABLET | Freq: Two times a day (BID) | ORAL | Status: DC

## 2013-07-13 MED ORDER — ONDANSETRON HCL 4 MG/2ML IJ SOLN
INTRAMUSCULAR | Status: DC | PRN
  Administered 2013-07-13: 4 mg via INTRAVENOUS

## 2013-07-13 MED ORDER — SODIUM CHLORIDE 0.9 % IR SOLN
Status: DC | PRN
  Administered 2013-07-13: 2000 mL

## 2013-07-13 MED ORDER — KETAMINE HCL 10 MG/ML IJ SOLN
INTRAMUSCULAR | Status: DC | PRN
  Administered 2013-07-13: 20 mg via INTRAVENOUS
  Administered 2013-07-13: 10 mg via INTRAVENOUS

## 2013-07-13 MED ORDER — LIDOCAINE HCL 2 % EX GEL
CUTANEOUS | Status: AC
  Filled 2013-07-13: qty 10

## 2013-07-13 MED ORDER — KETOROLAC TROMETHAMINE 30 MG/ML IJ SOLN: 30.0000 mg | Freq: Once | INTRAMUSCULAR | Status: DC

## 2013-07-13 MED ORDER — PROPOFOL 10 MG/ML IV BOLUS
INTRAVENOUS | Status: DC | PRN
  Administered 2013-07-13: 150 mg via INTRAVENOUS
  Administered 2013-07-13: 20 mg via INTRAVENOUS

## 2013-07-13 MED ORDER — DEXAMETHASONE SODIUM PHOSPHATE 10 MG/ML IJ SOLN
INTRAMUSCULAR | Status: AC
  Filled 2013-07-13: qty 1

## 2013-07-13 MED ORDER — MIDAZOLAM HCL 2 MG/2ML IJ SOLN
INTRAMUSCULAR | Status: AC
  Filled 2013-07-13: qty 2

## 2013-07-13 MED ORDER — GLYCOPYRROLATE 0.2 MG/ML IJ SOLN
INTRAMUSCULAR | Status: DC | PRN
  Administered 2013-07-13 (×4): .05 mg via INTRAVENOUS

## 2013-07-13 MED ORDER — GLYCOPYRROLATE 0.2 MG/ML IJ SOLN
INTRAMUSCULAR | Status: AC
  Filled 2013-07-13: qty 1

## 2013-07-13 MED ORDER — LIDOCAINE HCL 2 % EX GEL
CUTANEOUS | Status: AC
  Filled 2013-07-13: qty 5

## 2013-07-13 MED ORDER — ONDANSETRON HCL 4 MG/2ML IJ SOLN
INTRAMUSCULAR | Status: AC
  Filled 2013-07-13: qty 2

## 2013-07-13 MED ORDER — LACTATED RINGERS IV SOLN: INTRAVENOUS | Status: DC

## 2013-07-13 MED ORDER — MIDAZOLAM HCL 5 MG/5ML IJ SOLN
INTRAMUSCULAR | Status: DC | PRN
  Administered 2013-07-13: 2 mg via INTRAVENOUS

## 2013-07-13 MED ORDER — LACTATED RINGERS IV SOLN
INTRAVENOUS | Status: DC | PRN
  Administered 2013-07-13 (×2): via INTRAVENOUS

## 2013-07-13 MED ORDER — BELLADONNA ALKALOIDS-OPIUM 16.2-60 MG RE SUPP
RECTAL | Status: AC
  Filled 2013-07-13: qty 1

## 2013-07-13 MED ORDER — PROMETHAZINE HCL 25 MG/ML IJ SOLN: 6.2500 mg | INTRAMUSCULAR | Status: DC | PRN

## 2013-07-13 MED ORDER — METOCLOPRAMIDE HCL 5 MG/ML IJ SOLN: INTRAMUSCULAR | Status: DC | PRN

## 2013-07-13 MED ORDER — EPHEDRINE SULFATE 50 MG/ML IJ SOLN
INTRAMUSCULAR | Status: DC | PRN
  Administered 2013-07-13: 5 mg via INTRAVENOUS

## 2013-07-13 MED ORDER — FENTANYL CITRATE 0.05 MG/ML IJ SOLN
INTRAMUSCULAR | Status: DC | PRN
  Administered 2013-07-13 (×2): 25 ug via INTRAVENOUS
  Administered 2013-07-13: 12.5 ug via INTRAVENOUS
  Administered 2013-07-13: 25 ug via INTRAVENOUS
  Administered 2013-07-13: 12.5 ug via INTRAVENOUS

## 2013-07-13 SURGICAL SUPPLY — 39 items
BAG DRAIN URO-CYSTO SKYTR STRL (DRAIN) ×3 IMPLANT
BAG URO CATCHER STRL LF (DRAPE) ×3 IMPLANT
BASKET LASER NITINOL 1.9FR (BASKET) IMPLANT
BASKET STNLS GEMINI 4WIRE 3FR (BASKET) IMPLANT
BASKET ZERO TIP NITINOL 2.4FR (BASKET) ×6 IMPLANT
BRUSH URET BIOPSY 3F (UROLOGICAL SUPPLIES) IMPLANT
BSKT STON RTRVL 120 1.9FR (BASKET)
CANISTER SUCT LVC 12 LTR MEDI- (MISCELLANEOUS) IMPLANT
CATH INTERMIT  6FR 70CM (CATHETERS) ×3 IMPLANT
CATH URET 5FR 28IN CONE TIP (BALLOONS)
CATH URET 5FR 70CM CONE TIP (BALLOONS) IMPLANT
CLOTH BEACON ORANGE TIMEOUT ST (SAFETY) ×3 IMPLANT
CONT SPEC 4OZ CLIKSEAL STRL BL (MISCELLANEOUS) IMPLANT
DRAPE CAMERA CLOSED 9X96 (DRAPES) ×3 IMPLANT
ELECT REM PT RETURN 9FT ADLT (ELECTROSURGICAL)
ELECTRODE REM PT RTRN 9FT ADLT (ELECTROSURGICAL) IMPLANT
FIBER LASER FLEXIVA 200 (UROLOGICAL SUPPLIES) ×3 IMPLANT
GLOVE BIO SURGEON STRL SZ7.5 (GLOVE) ×3 IMPLANT
GLOVE BIOGEL M STRL SZ7.5 (GLOVE) ×3 IMPLANT
GOWN STRL REUS W/TWL LRG LVL3 (GOWN DISPOSABLE) ×3 IMPLANT
GOWN STRL REUS W/TWL XL LVL3 (GOWN DISPOSABLE) ×3 IMPLANT
GUIDEWIRE 0.038 PTFE COATED (WIRE) IMPLANT
GUIDEWIRE ANG ZIPWIRE 038X150 (WIRE) ×3 IMPLANT
GUIDEWIRE STR DUAL SENSOR (WIRE) ×3 IMPLANT
IV NS IRRIG 3000ML ARTHROMATIC (IV SOLUTION) IMPLANT
KIT BALLIN UROMAX 15FX10 (LABEL) IMPLANT
KIT BALLN UROMAX 15FX4 (MISCELLANEOUS) IMPLANT
KIT BALLN UROMAX 26 75X4 (MISCELLANEOUS)
MANIFOLD NEPTUNE II (INSTRUMENTS) ×3 IMPLANT
PACK CYSTO (CUSTOM PROCEDURE TRAY) ×3 IMPLANT
PACK CYSTOSCOPY (CUSTOM PROCEDURE TRAY) ×3 IMPLANT
SET HIGH PRES BAL DIL (LABEL)
SHEATH ACCESS URETERAL 38CM (SHEATH) ×3 IMPLANT
SHEATH URET ACCESS 12FR/35CM (UROLOGICAL SUPPLIES) IMPLANT
SHEATH URET ACCESS 12FR/55CM (UROLOGICAL SUPPLIES) IMPLANT
STENT CONTOUR 6FRX26X.038 (STENTS) ×3 IMPLANT
SYRINGE IRR TOOMEY STRL 70CC (SYRINGE) IMPLANT
TUBING CONNECTING 10 (TUBING) ×2 IMPLANT
TUBING CONNECTING 10' (TUBING) ×1

## 2013-07-13 NOTE — H&P (View-Only) (Signed)
History of Present Illness            F/u -     1- NGB - C4 quadraplegia. He had bladder stones a few years ago. He does CIC with the help of his mother QID.  -Oct 2012 US showed no hydronephrosis and 111 mL in bladder. He was changed to [facility] last visit from [facility] to help with urge incontinence and autonomic dysreflexia. He tried oxybutynin and had "worse cotton mouth" he's ever had. 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 If he caths he will get wet after about 1-2 hours but sometimes within an hour. He is wet at night. He gets "a wierd feeling, his blood pressure does something funny", he gets sweaty with goose bumps when he leaks. He wears condom catheter. This is bothersome to him.  -Feb 2013 - cystoscopy normal  -Feb 2013 - UDS - showed a small capacity, hyperreflexic bladder with moderate incontinence. His capacity was 150 mL. His DLPP was 43-49 cm/H20. He voided by involuntary contraction with a slow flow and an elevated postvoid residual. EMG was normal but this may be artifact.  -Mar 2013 - Botox   -Aug 2013 - UTI and admission  -Oct 2013 Botox 300U injection   -Dec 2013 Renal US - normal apart from poss 5 mm and 4 mm right renal stone  -Apr 2014 Botox 300U  -Jan 2015 - urine cx negative. I ordered ceftriaxone botox preop based on his last culture. Plan Botox.     2-  Nephrolithiasis  -Dec 2013 5 mm and 4 mm shadowing poss stones  -Apr 2014 KUB 11 x 4 calc over right kidney  -Jul 2014 (today) - KUB stable RLP calc - [place] [facility]    he got married May 2014.         FEB 2015  Interval Hx  Ryan Watkins returns for an appointment that was already scheduled prior to me seeing him last week. His urine culture is negative. I discussed this with him. He is due for renal ultrasound to evaluate the upper tracts as well as the right lower pole stone.     Renal ultrasound today-I reviewed all the images, please see the technician sheet for  details, findings: The kidneys are normal in appearance without masses or hydronephrosis. The right lower pole contains an 8 mm hyperechoic area consistent with the prior stent. The bladder appears normal. The post void is 194 cc, but the patient catheterized a few hours ago. No ureters are seen. Comparison to KUB July 2014.         Past Medical History Problems  1. 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 History of Bladder Cystotomy With Direct Removal Of Calculus 2. History of Cervical Vertebral Fusion 3. History of Cosmetic Surgery (V50.1) 4. History of Neck Surgery 5. History of Oral Surgery Tooth Extraction 6. History of Surgical Flaps 7. History of Urologic Surgery 8. History of Urologic Surgery 9. History of Urologic Surgery 10. History of Wrist Surgery  Current Meds 1. Advil TABS;  Therapy: (Recorded:23Oct2008) to Recorded 2. Allergy TABS;  Therapy: (Recorded:23Oct2008) to Recorded 3. Fluticasone Propionate 50 MCG/ACT Nasal Suspension;  Therapy: (Recorded:31Mar2014) to Recorded 4. Symbicort AERO;  Therapy: (Recorded:30Jul2014) to Recorded  Allergies Medication  1. Morphine Derivatives  Family History Problems  1. Family history of Brain Cancer (V16.8) : Maternal Grandfather 2. Family history of Cervical Cancer : Mother 3. Family history of Heart Disease (V17.49) : Father 4.  Family history of Heart Disease (V17.49) : Paternal Grandfather 5. Family history of Lung Cancer (V16.1) : Mother  Social History Problems  1. Denied: Alcohol Use 2. Caffeine Use   2-3 can drinks per day 3. Marital History - Single 4. Never A Smoker 5. Denied: Tobacco Use  Vitals Vital Signs [Data Includes: Last 1 Day]  Recorded: 09Feb2015 11:13AM  Blood Pressure: 114 / 77 Temperature: 98.2 F Heart Rate: 73  Results/Data Urine [Data Includes: Last 1 Day]   09Feb2015  COLOR YELLOW   APPEARANCE CLOUDY   SPECIFIC GRAVITY 1.015   pH 6.0   GLUCOSE NEG  mg/dL  BILIRUBIN NEG   KETONE NEG mg/dL  BLOOD MOD   PROTEIN TRACE mg/dL  UROBILINOGEN 0.2 mg/dL  NITRITE NEG   LEUKOCYTE ESTERASE MOD   SQUAMOUS EPITHELIAL/HPF NONE SEEN   WBC 11-20 WBC/hpf  RBC NONE SEEN RBC/hpf  BACTERIA MODERATE   CRYSTALS NONE SEEN   CASTS NONE SEEN    Assessment Assessed  1. Neurogenic bladder (596.54) 2. Urge incontinence of urine (788.31) 3. Nephrolithiasis (592.0)  Plan Health Maintenance  1. UA With REFLEX; [Do Not Release]; Status:Resulted - Requires Verification;   Done:  09Feb2015 10:33AM  Discussion/Summary Nephrlothiasis - he asked about URS during the botox which is a reasonable consideration. We discussed it appears the stone is stable but at a good size to be difficult to pass and a good size to tx with the URS. We discussed nature, r/b of continued surveillance, URS/laser/stent and ESWL. Given his high spinal level he'd need general for ESWL and it might be harder to clear and drain fragments. On the other hand we discussed URS is more invasive and might need to be staged/pre-stent. He wants to proceed with URS and Botox - I'll add it to the botox green sheet. He's here with his mother.      Signatures Electronically signed by : [name], M.D.; Jun 14 2013 12:12PM EST   Add: recent Urine Cx in office was negative

## 2013-07-13 NOTE — Progress Notes (Signed)
PACU note: Pt. Blood pressure remains elevated. 154/112. Bladder distented, bladder scan showed 609 cc's. Dr. Acey Lavarignan notified order received for I&O cath. I&O cath done, 110 cc's pink urine drained, small blood clot noted on tip of catheter when removed. Dr. Mena GoesEskridge updated on status. He will be in to evaluate patient soon.

## 2013-07-13 NOTE — Op Note (Signed)
Preoperative Diagnosis: Right renal stone Postop diagnosis: Right renal stone  Procedure: Cystoscopy, right ureteroscopy, holmium laser lithotripsy, stone basket extraction, stent placement  Surgeon: Mena GoesEskridge  Anesthesia: Gen.  Findings on ureteroscopy there was a large stone in the right renal pelvis there was 10-12 mm in size. Photos were taken but the digital scope was turned off before they were printed.  Description of procedure: After consent was obtained patient brought to the operating room. After adequate anesthesia he is placed in lithotomy position and prepped and draped in the usual sterile fashion. A timeout was performed to confirm the patient and procedure. Cystoscopy was performed and the bladder irrigated x4. Urine initially was sent for culture.  The right ureteral stent was grasped and removed through the urethral meatus and a sensor wire was advanced through the stent and coiled in the right collecting system. Over the sensor wire a ureteral access sheath was passed in the inner cannula removed. It was utilized as a coaxial dilator to pass a second Glidewire. An access sheath was removed and then repassed over the sensor wire leaving the Glidewire is a safety wire. The access sheath passed easily. The sensor wire and inner cannula were removed and the digital ureteroscope passed. In the renal pelvis was a large stone. I tried to grasp it and move it up into the upper pole calyx for lithotripsy was too large. It would not pass into the infundibulum side dropped it back in the renal pelvis. I initially started at 0.8 in 8 to break into big pieces to remove sequentially but it was very fragile and dusted easily. I changed the settings to 0.5 and 20 and dusted the stone except for about 4 large pieces. To avoid 20 the midpole calyx one of which a pushed up into the upper pole. These were sequentially removed as well as 5 clots of fragments collected in clot in proteinaceous debris. This  resolved all the fragments were seen on spot fluoroscopy. The calyces were also inspected and no significant fragments noted. Any pieces it remained less than a millimeter.  The access sheath was then backed out on the ureteroscope and the ureter carefully examined and noted to be free of injury and no stones.  Cystoscope was passed adjacent to the Glidewire and the bladder drained. The wire was then backloaded on the cystoscope and a 6 x 26 cm stent was passed. The wire was removed a good coil seen in the renal pelvis and a good coil in the kidney.  The bladder was drained and the scope removed. The urethra appeared normal.  Patient was awakened taken to recovery room in stable condition.  Complications: None Blood loss: Minimal  Drains: 6 x 26 cm right ureteral stent  Specimens: #1 urine for C&S to hospital lab #2 stone fragments to office lab

## 2013-07-13 NOTE — Anesthesia Preprocedure Evaluation (Signed)
Anesthesia Evaluation  Patient identified by MRN, date of birth, ID band Patient awake    Reviewed: Allergy & Precautions, H&P , NPO status , Patient's Chart, lab work & pertinent test results, reviewed documented beta blocker date and time   Airway Mallampati: I TM Distance: >3 FB Neck ROM: Full    Dental  (+) Teeth Intact   Pulmonary neg pulmonary ROS, asthma , pneumonia -,  breath sounds clear to auscultation        Cardiovascular negative cardio ROS  Rhythm:Regular Rate:Normal  Denies cardiac symptoms   Neuro/Psych C4 Quadriplegia negative psych ROS   GI/Hepatic negative GI ROS, Neg liver ROS,   Endo/Other  negative endocrine ROS  Renal/GU negative Renal ROS   Neurogenic bladder    Musculoskeletal negative musculoskeletal ROS (+)   Abdominal   Peds  Hematology negative hematology ROS (+)   Anesthesia Other Findings Previous trach. closed  Reproductive/Obstetrics                           Anesthesia Physical  Anesthesia Plan  ASA: II  Anesthesia Plan: General   Post-op Pain Management:    Induction: Intravenous  Airway Management Planned: LMA  Additional Equipment:   Intra-op Plan:   Post-operative Plan: Extubation in OR  Informed Consent: I have reviewed the patients History and Physical, chart, labs and discussed the procedure including the risks, benefits and alternatives for the proposed anesthesia with the patient or authorized representative who has indicated his/her understanding and acceptance.   Dental advisory given  Plan Discussed with: CRNA  Anesthesia Plan Comments:         Anesthesia Quick Evaluation                                  Anesthesia Evaluation  Patient identified by MRN, date of birth, ID band Patient awake    Reviewed: Allergy & Precautions, H&P , NPO status , Patient's Chart, lab work & pertinent test results, reviewed documented  beta blocker date and time   Airway Mallampati: I TM Distance: >3 FB Neck ROM: Full    Dental  (+) Teeth Intact   Pulmonary neg pulmonary ROS, asthma , neg pneumonia -,  breath sounds clear to auscultation        Cardiovascular negative cardio ROS  Rhythm:Regular Rate:Normal  Denies cardiac symptoms   Neuro/Psych Quadraplegia. C4. negative psych ROS   GI/Hepatic negative GI ROS, Neg liver ROS,   Endo/Other  negative endocrine ROS  Renal/GU negative Renal ROS   Neurogenic bladder    Musculoskeletal negative musculoskeletal ROS (+)   Abdominal   Peds negative pediatric ROS (+)  Hematology negative hematology ROS (+)   Anesthesia Other Findings Previous trach. closed  Reproductive/Obstetrics negative OB ROS                           Anesthesia Physical  Anesthesia Plan  ASA: III  Anesthesia Plan: General   Post-op Pain Management:    Induction:   Airway Management Planned: LMA  Additional Equipment:   Intra-op Plan:   Post-operative Plan: Extubation in OR  Informed Consent: I have reviewed the patients History and Physical, chart, labs and discussed the procedure including the risks, benefits and alternatives for the proposed anesthesia with the patient or authorized representative who has indicated his/her understanding and acceptance.  Plan Discussed with: CRNA and Surgeon  Anesthesia Plan Comments:         Anesthesia Quick Evaluation                                   Anesthesia Evaluation  Patient identified by MRN, date of birth, ID band Patient awake    Reviewed: Allergy & Precautions, H&P , NPO status , Patient's Chart, lab work & pertinent test results, reviewed documented beta blocker date and time   Airway Mallampati: I TM Distance: >3 FB Neck ROM: Full    Dental  (+) Teeth Intact   Pulmonary neg pulmonary ROS, asthma , neg pneumonia -,  breath sounds clear to  auscultation        Cardiovascular negative cardio ROS  Rhythm:Regular Rate:Normal  Denies cardiac symptoms   Neuro/Psych Quadraplegia. C4. negative psych ROS   GI/Hepatic negative GI ROS, Neg liver ROS,   Endo/Other  negative endocrine ROS  Renal/GU negative Renal ROS   Neurogenic bladder    Musculoskeletal negative musculoskeletal ROS (+)   Abdominal   Peds  Hematology negative hematology ROS (+)   Anesthesia Other Findings Previous trach. closed  Reproductive/Obstetrics negative OB ROS                          Anesthesia Physical Anesthesia Plan  ASA: III  Anesthesia Plan: General   Post-op Pain Management:    Induction: Intravenous  Airway Management Planned: LMA  Additional Equipment:   Intra-op Plan:   Post-operative Plan: Extubation in OR  Informed Consent: I have reviewed the patients History and Physical, chart, labs and discussed the procedure including the risks, benefits and alternatives for the proposed anesthesia with the patient or authorized representative who has indicated his/her understanding and acceptance.   Dental advisory given  Plan Discussed with: CRNA  Anesthesia Plan Comments:        Anesthesia Quick Evaluation

## 2013-07-13 NOTE — Preoperative (Signed)
Beta Blockers   Reason not to administer Beta Blockers:Not Applicable, not on home BB 

## 2013-07-13 NOTE — Interval H&P Note (Signed)
History and Physical Interval Note:  07/13/2013 9:15 AM  Karena AddisonLarry J Hajjar  has presented today for surgery, with the diagnosis of RIGHT RENAL STONE. He is s/p cysto, botox, right ureteral stent about 2 weeks ago. He has been well. No fever, chills. Urine clear. He completed Cipro.  The various methods of treatment have been discussed with the patient and family. After consideration of risks, benefits and other options for treatment, the patient has consented to  Procedure(s): RIGHT URETEROSCOPY/STENT PLACEMENT (Right) RIGHT LASER LITHOTRIPSY (Right) as a surgical intervention .  The patient's history has been reviewed, patient examined, no change in status, stable for surgery.  I have reviewed the patient's chart and labs.  Questions were answered to the patient's satisfaction.  We discussed if failure to gain RG access might consider ESWL or continue surveillance. Discussed risk of infection. Checked past cx's and Rocephin is a good choice for pre-op abx. He elects to proceed.    Antony HasteEskridge, Zandrea Kenealy Ramsey

## 2013-07-13 NOTE — Transfer of Care (Signed)
Immediate Anesthesia Transfer of Care Note  Patient: Ryan AddisonLarry J Dovidio  Procedure(s) Performed: Procedure(s): CYSTOSCOPY RIGHT URETEROSCOPY/LASER Burney GauzeLITHROSCOPY [name]/STONE BASKET EXTRACTION STENT PLACEMENT (Right) RIGHT LASER LITHOTRIPSY (Right)  Patient Location: PACU  Anesthesia Type:General  Level of Consciousness: Patient easily awoken, sedated, comfortable, cooperative, following commands, responds to stimulation.   Airway & Oxygen Therapy: Patient spontaneously breathing, ventilating well, oxygen via simple oxygen mask.  Post-op Assessment: Report given to PACU RN, vital signs reviewed and stable, moving all extremities.   Post vital signs: Reviewed and stable.  Complications: No apparent anesthesia complications

## 2013-07-13 NOTE — Discharge Instructions (Signed)
Ureteral Stent Implantation, Care After Refer to this sheet in the next few weeks. These instructions provide you with information on caring for yourself after your procedure. Your health care provider may also give you more specific instructions. Your treatment has been planned according to current medical practices, but problems sometimes occur. Call your health care provider if you have any problems or questions after your procedure. WHAT TO EXPECT AFTER THE PROCEDURE You should be back to normal activity within 48 hours after the procedure. Nausea and vomiting may occur and are commonly the result of anesthesia. It is common to experience sharp pain in the back or lower abdomen and penis with voiding. This is caused by movement of the ends of the stent with the act of urinating.It usually goes away within minutes after you have stopped urinating. HOME CARE INSTRUCTIONS Make sure to drink plenty of fluids. You may have small amounts of bleeding, causing your urine to be red. This is normal. Certain movements may trigger pain or a feeling that you need to urinate. You may be given medications to prevent infection or bladder spasms. Be sure to take all medications as directed. Only take over-the-counter or prescription medicines for pain, discomfort, or fever as directed by your health care provider. Do not take aspirin, as this can make bleeding worse.   +++++++++++Your stent will be left removed in the office in 1-2 weeks+++++   SEEK MEDICAL CARE IF:  You experience increasing pain.  Your pain medication is not working. SEEK IMMEDIATE MEDICAL CARE IF:  Your urine is dark red or has blood clots.  You are leaking urine (incontinent).  You have a fever, chills, feeling sick to your stomach (nausea), or vomiting.  Your pain is not relieved by pain medication.  The end of the stent comes out of the urethra.  You are unable to urinate. Document Released: 12/23/2012 Document Reviewed:  12/23/2012 [facility] Patient Information 2014 [employer] [place].

## 2013-07-13 NOTE — Anesthesia Postprocedure Evaluation (Signed)
  Anesthesia Post-op Note  Patient: Ryan Watkins  Procedure(s) Performed: Procedure(s) (LRB): CYSTOSCOPY RIGHT URETEROSCOPY/LASER LITHROSCOPY [name]/STONE BASKET EXTRACTION STENT PLACEMENT (Right) RIGHT LASER LITHOTRIPSY (Right)  Patient Location: PACU  Anesthesia Type: General  Level of Consciousness: awake and alert   Airway and Oxygen Therapy: Patient Spontanous Breathing  Post-op Pain: mild  Post-op Assessment: Post-op Vital signs reviewed, Patient's Cardiovascular Status Stable, Respiratory Function Stable, Patent Airway and No signs of Nausea or vomiting  Last Vitals:  Filed Vitals:   07/13/13 1443  BP: 125/82  Pulse: 88  Temp: 37.2 C  Resp: 16    Post-op Vital Signs: stable   Complications: No apparent anesthesia complications

## 2013-07-14 ENCOUNTER — Encounter (HOSPITAL_COMMUNITY): Payer: Self-pay | Admitting: Urology

## 2013-07-15 LAB — URINE CULTURE: Colony Count: >=100000

## 2014-03-11 ENCOUNTER — Other Ambulatory Visit: Payer: Self-pay | Admitting: Urology

## 2014-03-16 ENCOUNTER — Encounter (HOSPITAL_COMMUNITY): Payer: Self-pay | Admitting: *Deleted

## 2014-03-24 NOTE — H&P (Signed)
History of Present Illness    F/u -     1- NGB - C4 quadraplegia. He had bladder stones a few years ago. He does CIC with the help of his mother QID.  -Oct 2012 US showed no hydronephrosis and 111 mL in bladder. He was changed to [facility] last visit from [facility] to help with urge incontinence and autonomic dysreflexia. He tried oxybutynin and had "worse cotton mouth" he's ever had. 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 If he caths he will get wet after about 1-2 hours but sometimes within an hour. He is wet at night. He gets "a wierd feeling, his blood pressure does something funny", he gets sweaty with goose bumps when he leaks. He wears condom catheter. This is bothersome to him.  -Feb 2013 - cystoscopy normal  -Feb 2013 - UDS - showed a small capacity, hyperreflexic bladder with moderate incontinence. His capacity was 150 mL. His DLPP was 43-49 cm/H20. He voided by involuntary contraction with a slow flow and an elevated postvoid residual. EMG was normal but this may be artifact.  -Mar 2013 - Botox   -Aug 2013 - UTI and admission  -Oct 2013 Botox 300U injection   -Dec 2013 Renal US - normal apart from poss 5 mm and 4 mm right renal stone  -Apr 2014 Botox 300U  -Jan 2015 - urine cx negative. I ordered ceftriaxone botox preop based on his last culture. Plan Botox.   -Feb 2015 - Botox 300U; Renal ultrasound - kidneys are normal in appearance without masses or hydronephrosis. The right lower pole contains an 8 mm hyperechoic area consistent with the prior stent. The bladder appears normal. The post void is 194 cc, but the patient catheterized a few hours ago. No ureters are seen.  Urine culture from OR stenotrophomonas maltophilia which was sensitive to Levaquin, Bactrim DS.        2-  Nephrolithiasis  -Dec 2013 5 mm and 4 mm shadowing poss stones  -Apr 2014 KUB 11 x 4 calc over right kidney  -Jul 2014 - KUB stable RLP calc   -Feb - Mar 2015 - staged right URS, stent.  Stent removed in office.   -Aug 2015 renal U/S negative     he got married May 2014.         Nov 2015  Interval Hx  He returns and has been having to catheterize more frequently to prevent autonomic dysreflexia. He has not had a lot in the way of incontinence. He's had no fever. He usually catheterizes 4 times a day but has had increased to 5-6. He also noticed a small stone wouldn't catheterize.    I asked him about their plans for children and is something that like to do. He says he does get erections and ejaculate. They're wondering about his fertility potential.       Past Medical History Problems  1. History of Hay Fever  Surgical History Problems  1. History of Bladder Cystotomy With Direct Removal Of Calculus 2. History of Cervical Vertebral Fusion 3. History of Cosmetic Surgery 4. History of Cystoscopy With Insertion Of Ureteral Stent Right 5. History of Cystoscopy With Insertion Of Ureteral Stent Right 6. History of Cystoscopy With Ureteroscopy Right 7. History of Cystoscopy With Ureteroscopy With Lithotripsy 8. History of Neck Surgery 9. History of Oral Surgery Tooth Extraction 10. History of Surgical Flaps 11. History of Urologic Surgery 12. History of Urologic Surgery 13. History of Urologic Surgery 14. History of Urologic Surgery 15. History  of Wrist Surgery  Current Meds 1. Advil TABS;  Therapy: (Recorded:23Oct2008) to Recorded 2. Allergy TABS;  Therapy: (Recorded:23Oct2008) to Recorded 3. Dulera AERO;  Therapy: (Recorded:20Mar2015) to Recorded 4. Fluticasone Propionate 50 MCG/ACT Nasal Suspension;  Therapy: (Recorded:31Mar2014) to Recorded 5. Stool Softener CAPS;  Therapy: (Recorded:04Nov2015) to Recorded  Allergies Medication  1. Morphine Derivatives  Family History Problems  1. Family history of Brain Cancer : Maternal Grandfather 2. Family history of Cervical Cancer : Mother 3. Family history of Heart Disease : Father 4.  Family history of Heart Disease : Paternal Grandfather 5. Family history of Lung Cancer : Mother  Social History Problems  1. Denied: Alcohol Use 2. Caffeine Use   2-3 can drinks per day 3. Marital History - Single 4. Never A Smoker 5. Denied: Tobacco Use  Vitals Vital Signs [Data Includes: Last 1 Day]  Recorded: 04Nov2015 04:32PM  Blood Pressure: 101 / 69 Temperature: 98.1 F Heart Rate: 95  Physical Exam Constitutional: Well nourished and well developed . No acute distress.  Neuro/Psych:. Mood and affect are appropriate.    Results/Data Urine [Data Includes: Last 1 Day]   04Nov2015  COLOR YELLOW   APPEARANCE CLOUDY   SPECIFIC GRAVITY 1.020   pH 6.5   GLUCOSE NEG mg/dL  BILIRUBIN NEG   KETONE NEG mg/dL  BLOOD TRACE   PROTEIN NEG mg/dL  UROBILINOGEN 0.2 mg/dL  NITRITE POS   LEUKOCYTE ESTERASE TRACE   SQUAMOUS EPITHELIAL/HPF RARE   WBC 7-10 WBC/hpf  RBC 0-2 RBC/hpf  BACTERIA MANY   CRYSTALS NONE SEEN   CASTS NONE SEEN    Assessment Assessed  1. Bacteriuria, asymptomatic (N39.0) 2. 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 URINE CULTURE; Status:Hold For - Specimen/Data Collection,Appointment; Requested  for:04Nov2015;  Health Maintenance  2. UA With REFLEX; [Do Not Release]; Status:Complete;   Done: 04Nov2015 03:35PM Male infertility  3. SEMEN ANALYSIS COMPREHENSIVE; Status:Hold For - Appointment,Given To Patient;  Requested for:04Nov2015;  Neurogenic bladder  4. Follow-up Schedule Surgery Office  Follow-up  Status: Hold For - Appointment   Requested for: 04Nov2015  Discussion/Summary Asymptomatic bacteriuria - I sent urine for culture so we will know what preop antibiotics to use.    Neurogenic bladder-we'll schedule Botox 300 unit injection.    Male infertility-will perform semen analysis to give him an idea of fertility potential.      Quarry managerignatures Electronically signed  by : Jerilee FieldMatthew Iban Utz, M.D.; Mar 09 2014  4:59PM EST   ADD: Cipro sent for preop antibiotics based on office urine culture.

## 2014-03-28 MED ORDER — LEVOFLOXACIN IN D5W 500 MG/100ML IV SOLN
500.0000 mg | INTRAVENOUS | Status: AC
  Administered 2014-03-29: 500 mg via INTRAVENOUS
  Filled 2014-03-28: qty 100

## 2014-03-29 ENCOUNTER — Ambulatory Visit (HOSPITAL_COMMUNITY): Payer: Medicare Other | Admitting: Certified Registered Nurse Anesthetist

## 2014-03-29 ENCOUNTER — Ambulatory Visit (HOSPITAL_COMMUNITY)
Admission: RE | Admit: 2014-03-29 | Discharge: 2014-03-29 | Disposition: A | Discharge status: Home / Self Care | Payer: Medicare Other | Source: Ambulatory Visit | Attending: Urology | Admitting: Urology

## 2014-03-29 ENCOUNTER — Encounter (HOSPITAL_COMMUNITY): Payer: Self-pay | Admitting: *Deleted

## 2014-03-29 ENCOUNTER — Encounter (HOSPITAL_COMMUNITY)
Admission: RE | Disposition: A | Discharge status: Home / Self Care | Payer: Self-pay | Source: Ambulatory Visit | Attending: Urology

## 2014-03-29 DIAGNOSIS — Z791 Long term (current) use of non-steroidal anti-inflammatories (NSAID): Secondary | ICD-10-CM | POA: Insufficient documentation

## 2014-03-29 DIAGNOSIS — N469 Male infertility, unspecified: Secondary | ICD-10-CM | POA: Insufficient documentation

## 2014-03-29 DIAGNOSIS — G8250 Quadriplegia, unspecified: Secondary | ICD-10-CM | POA: Diagnosis not present

## 2014-03-29 DIAGNOSIS — J45909 Unspecified asthma, uncomplicated: Secondary | ICD-10-CM | POA: Insufficient documentation

## 2014-03-29 DIAGNOSIS — N390 Urinary tract infection, site not specified: Secondary | ICD-10-CM | POA: Diagnosis not present

## 2014-03-29 DIAGNOSIS — N319 Neuromuscular dysfunction of bladder, unspecified: Secondary | ICD-10-CM | POA: Diagnosis present

## 2014-03-29 DIAGNOSIS — Z9889 Other specified postprocedural states: Secondary | ICD-10-CM | POA: Insufficient documentation

## 2014-03-29 HISTORY — PX: CYSTOSCOPY WITH INJECTION: SHX1424

## 2014-03-29 LAB — CBC
HEMATOCRIT: 43 % (ref 39.0–52.0)
HEMOGLOBIN: 14.9 g/dL (ref 13.0–17.0)
MCH: 29.6 pg (ref 26.0–34.0)
MCHC: 34.7 g/dL (ref 30.0–36.0)
MCV: 85.5 fL (ref 78.0–100.0)
Platelets: 186 10*3/uL (ref 150–400)
RBC: 5.03 MIL/uL (ref 4.22–5.81)
RDW: 13 % (ref 11.5–15.5)
WBC: 7.3 10*3/uL (ref 4.0–10.5)

## 2014-03-29 LAB — BASIC METABOLIC PANEL
Anion gap: 13 (ref 5–15)
BUN: 14 mg/dL (ref 6–23)
CHLORIDE: 103 meq/L (ref 96–112)
CO2: 24 meq/L (ref 19–32)
Calcium: 9.4 mg/dL (ref 8.4–10.5)
Creatinine, Ser: 0.51 mg/dL (ref 0.50–1.35)
GFR calc Af Amer: >90 mL/min (ref >90)
Glucose, Bld: 106 mg/dL — ABNORMAL HIGH (ref 70–99)
POTASSIUM: 3.8 meq/L (ref 3.7–5.3)
SODIUM: 140 meq/L (ref 137–147)

## 2014-03-29 SURGERY — CYSTOSCOPY, WITH INJECTION OF BLADDER NECK OR BLADDER WALL
Anesthesia: General

## 2014-03-29 MED ORDER — ONABOTULINUMTOXINA 100 UNITS IJ SOLR
300.0000 [IU] | Freq: Once | INTRAMUSCULAR | Status: AC
  Administered 2014-03-29: 300 [IU] via INTRAMUSCULAR
  Filled 2014-03-29: qty 300

## 2014-03-29 MED ORDER — FENTANYL CITRATE 0.05 MG/ML IJ SOLN
INTRAMUSCULAR | Status: DC | PRN
  Administered 2014-03-29 (×3): 50 ug via INTRAVENOUS
  Administered 2014-03-29: 100 ug via INTRAVENOUS

## 2014-03-29 MED ORDER — LIDOCAINE HCL (CARDIAC) 20 MG/ML IV SOLN
INTRAVENOUS | Status: AC
  Filled 2014-03-29: qty 5

## 2014-03-29 MED ORDER — PROPOFOL 10 MG/ML IV BOLUS
INTRAVENOUS | Status: AC
  Filled 2014-03-29: qty 20

## 2014-03-29 MED ORDER — FENTANYL CITRATE 0.05 MG/ML IJ SOLN
25.0000 ug | INTRAMUSCULAR | Status: DC | PRN
Start: 2014-03-29 — End: 2014-03-29

## 2014-03-29 MED ORDER — MIDAZOLAM HCL 2 MG/2ML IJ SOLN
INTRAMUSCULAR | Status: AC
  Filled 2014-03-29: qty 2

## 2014-03-29 MED ORDER — PROMETHAZINE HCL 25 MG/ML IJ SOLN: 6.2500 mg | INTRAMUSCULAR | Status: DC | PRN

## 2014-03-29 MED ORDER — FENTANYL CITRATE 0.05 MG/ML IJ SOLN
INTRAMUSCULAR | Status: AC
  Filled 2014-03-29: qty 5

## 2014-03-29 MED ORDER — LACTATED RINGERS IV SOLN
INTRAVENOUS | Status: DC | PRN
  Administered 2014-03-29 (×2): via INTRAVENOUS

## 2014-03-29 MED ORDER — LIDOCAINE HCL (CARDIAC) 20 MG/ML IV SOLN
INTRAVENOUS | Status: DC | PRN
  Administered 2014-03-29: 50 mg via INTRAVENOUS

## 2014-03-29 MED ORDER — PROPOFOL 10 MG/ML IV BOLUS
INTRAVENOUS | Status: DC | PRN
  Administered 2014-03-29: 175 mg via INTRAVENOUS

## 2014-03-29 MED ORDER — MIDAZOLAM HCL 5 MG/5ML IJ SOLN
INTRAMUSCULAR | Status: DC | PRN
  Administered 2014-03-29: 2 mg via INTRAVENOUS

## 2014-03-29 MED ORDER — SODIUM CHLORIDE 0.9 % IR SOLN
Status: DC | PRN
  Administered 2014-03-29: 1000 mL

## 2014-03-29 MED ORDER — SODIUM CHLORIDE 0.9 % IJ SOLN
INTRAMUSCULAR | Status: AC
  Filled 2014-03-29: qty 30

## 2014-03-29 SURGICAL SUPPLY — 17 items
BAG URO CATCHER STRL LF (DRAPE) ×3 IMPLANT
CLOTH BEACON ORANGE TIMEOUT ST (SAFETY) ×3 IMPLANT
DEFLUX NEEDLE (NEEDLE) IMPLANT
DEFLUX SYRINGE (SYRINGE) IMPLANT
DRAPE CAMERA CLOSED 9X96 (DRAPES) ×3 IMPLANT
GLOVE BIOGEL M STRL SZ7.5 (GLOVE) ×3 IMPLANT
GOWN STRL REUS W/TWL LRG LVL3 (GOWN DISPOSABLE) ×3 IMPLANT
GOWN STRL REUS W/TWL XL LVL3 (GOWN DISPOSABLE) ×3 IMPLANT
MANIFOLD NEPTUNE II (INSTRUMENTS) ×3 IMPLANT
NDL SAFETY ECLIPSE 18X1.5 (NEEDLE) ×2 IMPLANT
NEEDLE HYPO 18GX1.5 SHARP (NEEDLE) ×4
PACK CYSTO (CUSTOM PROCEDURE TRAY) ×3 IMPLANT
SYR CONTROL 10ML LL (SYRINGE) IMPLANT
SYRINGE 10CC LL (SYRINGE) ×9 IMPLANT
TUBING CONNECTING 10 (TUBING) ×2 IMPLANT
TUBING CONNECTING 10' (TUBING) ×1
WATER STERILE IRR 3000ML UROMA (IV SOLUTION) ×3 IMPLANT

## 2014-03-29 NOTE — Anesthesia Preprocedure Evaluation (Signed)
Anesthesia Evaluation  Patient identified by MRN, date of birth, ID band Patient awake    Reviewed: Allergy & Precautions, H&P , NPO status , Patient's Chart, lab work & pertinent test results  Airway Mallampati: II  TM Distance: >3 FB Neck ROM: Full    Dental no notable dental hx.    Pulmonary asthma , pneumonia -, resolved,  breath sounds clear to auscultation  Pulmonary exam normal       Cardiovascular negative cardio ROS  Rhythm:Regular Rate:Normal     Neuro/Psych Quadriplegia. negative psych ROS   GI/Hepatic negative GI ROS, Neg liver ROS,   Endo/Other  negative endocrine ROS  Renal/GU negative Renal ROS  negative genitourinary   Musculoskeletal negative musculoskeletal ROS (+)   Abdominal   Peds negative pediatric ROS (+)  Hematology negative hematology ROS (+)   Anesthesia Other Findings   Reproductive/Obstetrics negative OB ROS                             Anesthesia Physical Anesthesia Plan  ASA: III  Anesthesia Plan: General   Post-op Pain Management:    Induction: Intravenous  Airway Management Planned: LMA  Additional Equipment:   Intra-op Plan:   Post-operative Plan: Extubation in OR  Informed Consent: I have reviewed the patients History and Physical, chart, labs and discussed the procedure including the risks, benefits and alternatives for the proposed anesthesia with the patient or authorized representative who has indicated his/her understanding and acceptance.   Dental advisory given  Plan Discussed with: CRNA  Anesthesia Plan Comments:         Anesthesia Quick Evaluation

## 2014-03-29 NOTE — Op Note (Signed)
Preoperative diagnosis: Neurogenic bladder Postoperative diagnosis: Neurogenic bladder  Procedure: Cystoscopy, injection of Botox 300 units  Surgeon: Mena GoesEskridge  Anesthesia: [place]en[place]  Type of anesthesia: Gen.  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  Findings: Normal cystoscopy. Mucosa was normal. There was a small tan stone of 2-3 mm in the bladder. It was evacuated.  Description of procedure: After consent was obtained the patient was brought to the operating room. After adequate anesthesia he is placed in lithotomy position and prepped and draped in the usual sterile fashion. A timeout was performed to confirm the patient and procedure. Cystoscope was passed per urethra and the bladder triple rinsed. 300 units of Botox was then injected at 10 units per cc for 30 injections in the usual grade fashion above the trigone. Hemostasis was excellent. The bladder was drained and the scope removed. He was awakened taken to recovery room in stable condition.  Complications: None Blood loss: Minimal Drains: None Specimens: None  Disposition: Patient stable to PACU

## 2014-03-29 NOTE — Anesthesia Postprocedure Evaluation (Signed)
  Anesthesia Post-op Note  Patient: Ryan Watkins  Procedure(s) Performed: Procedure(s) (LRB): CYSTOSCOPY WITH BOTOX INJECTION (N/A)  Patient Location: PACU  Anesthesia Type: General  Level of Consciousness: awake and alert   Airway and Oxygen Therapy: Patient Spontanous Breathing  Post-op Pain: mild  Post-op Assessment: Post-op Vital signs reviewed, Patient's Cardiovascular Status Stable, Respiratory Function Stable, Patent Airway and No signs of Nausea or vomiting  Last Vitals:  Filed Vitals:   03/29/14 1200  BP: 130/90  Pulse: 65  Temp:   Resp:     Post-op Vital Signs: stable   Complications: No apparent anesthesia complications

## 2014-03-29 NOTE — Transfer of Care (Signed)
Immediate Anesthesia Transfer of Care Note  Patient: Ryan AddisonLarry J Kotch  Procedure(s) Performed: Procedure(s): CYSTOSCOPY WITH BOTOX INJECTION (N/A)  Patient Location: PACU  Anesthesia Type:General  Level of Consciousness: awake, alert  and oriented  Airway & Oxygen Therapy: Patient Spontanous Breathing and Patient connected to face mask oxygen  Post-op Assessment: Report given to PACU RN  Post vital signs: Reviewed and stable  Complications: No apparent anesthesia complications

## 2014-03-29 NOTE — Interval H&P Note (Signed)
History and Physical Interval Note:  03/29/2014 8:55 AM  Ryan Watkins  has presented today for surgery, with the diagnosis of neurogenic bladder  The various methods of treatment have been discussed with the patient and family. After consideration of risks, benefits and other options for treatment, the patient has consented to  Procedure(s): CYSTOSCOPY WITH BOTOX INJECTION (N/A) as a surgical intervention .  The patient's history has been reviewed, patient examined, no change in status, stable for surgery.  I have reviewed the patient's chart and labs.  Questions were answered to the patient's satisfaction.  Pt started abx Sunday. He's had no fever. He feels well. He elects to proceed.    Ryan Watkins, Ryan Watkins

## 2014-03-29 NOTE — Discharge Instructions (Signed)
Cystoscopy, Care After Refer to this sheet in the next few weeks. These instructions provide you with information on caring for yourself after your procedure. Your caregiver may also give you more specific instructions. Your treatment has been planned according to current medical practices, but problems sometimes occur. Call your caregiver if you have any problems or questions after your procedure. HOME CARE INSTRUCTIONS  Things you can do to ease any discomfort after your procedure include:  Drinking enough water and fluids to keep your urine clear or pale yellow.  Taking a warm bath to relieve any burning feelings. SEEK IMMEDIATE MEDICAL CARE IF:   You have an increase in blood in your urine.  You notice blood clots in your urine.  You have difficulty passing urine.  You have the chills.  You have abdominal pain.  You have a fever or persistent symptoms for more than 2-3 days.  You have a fever and your symptoms suddenly get worse. MAKE SURE YOU:   Understand these instructions.  Will watch your condition.  Will get help right away if you are not doing well or get worse. Document Released: 11/09/2004 Document Revised: 12/23/2012 Document Reviewed: 10/14/2011 [facility] Patient Information 2015 [place], [place]. This information is not intended to replace advice given to you by your health care provider. Make sure you discuss any questions you have with your health care provider.   OnabotulinumtoxinA injection (Medical Use) What is this medicine? ONABOTULINUMTOXINA (o na BOTT you lye num tox in eh) is a neuro-muscular blocker. This medicine is used to treat crossed eyes, eyelid spasms, severe neck muscle spasms, and elbow, wrist, and finger muscle spasms. It is also used to treat excessive underarm sweating, to prevent chronic migraine headaches, and to treat loss of bladder control due to neurologic conditions such as multiple sclerosis or spinal cord injury. This medicine may be  used for other purposes; ask your health care provider or pharmacist if you have questions. COMMON BRAND NAME(S): Botox What should I watch for while using this medicine? Visit your doctor for regular check ups. This medicine will cause weakness in the muscle where it is injected. Tell your doctor if you feel unusually weak in other muscles. Get medical help right away if you have problems with breathing, swallowing, or talking. This medicine might make your eyelids droop or make you see blurry or double. If you have weak muscles or trouble seeing do not drive a car, use machinery, or do other dangerous activities.  What side effects may I notice from receiving this medicine? Side effects that you should report to your doctor or health care professional as soon as possible: -allergic reactions like skin rash, itching or hives, swelling of the face, lips, or tongue -breathing problems -changes in vision -chest pain or tightness -eye irritation, pain -fast, irregular heartbeat -infection -numbness -speech problems -swallowing problems -unusual weakness Side effects that usually do not require medical attention (report to your doctor or health care professional if they continue or are bothersome): -bruising or pain at site where injected -drooping eyelid -dry eyes or mouth -headache -muscles aches, pains -sensitivity to light -tearing

## 2014-04-01 ENCOUNTER — Encounter (HOSPITAL_COMMUNITY): Payer: Self-pay | Admitting: Urology

## 2014-12-02 ENCOUNTER — Other Ambulatory Visit: Payer: Self-pay | Admitting: Urology

## 2014-12-09 ENCOUNTER — Encounter (HOSPITAL_COMMUNITY): Payer: Self-pay | Admitting: *Deleted

## 2014-12-09 NOTE — H&P (Signed)
History of Present Illness July 2016: He has been noticing an increase in bladder spasms and incontinence between CIC over the past few weeks. No evidence of autonomic dysreflexia. He notes feeling this way when it is getting close to time for another botox injection. He denies fever or recent infection. There has been no gross hematuria.    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 He's had no issue with urine cloudiness dysuria or autonomic dysreflexia. No incontinence.    1- NGB - C4 quadraplegia. He had bladder stones a few years ago. He does CIC with the help of his mother QID. Tried oxybutynin had very bothersome dry mouth.  -Last upper tract imaging: Aug 2015 Renal ultrasound - no hydronephrosis. RLP contains an 8 mm hyperechoic area. PVR 194 cc, but the patient catheterized a few hours ago.  -Last urodynamics: Feb 2013 - UDS - showed a small capacity, hyperreflexic bladder with moderate incontinence. His capacity was 150 mL. His DLPP was 43-49 cm/H20. He voided by involuntary contraction with a slow flow and an elevated postvoid residual. EMG was normal but this may be artifact.  -Last cystoscopy: November 2015  -Current therapy: Botox last injection 300 units November 2015      2-  Nephrolithiasis  -Dec 2013 5 mm and 4 mm shadowing poss stones  -Apr 2014 KUB 11 x 4 calc over right kidney  -Jul 2014 - KUB stable RLP calc   -Feb - Mar 2015 - staged right URS, stent. Stent removed in office.   -Aug 2015 renal U/S negative     3-fertility-he got married May 2014. He is getting erections and ejaculating.     January 2016 - semen analysis showed low semen volume (0.4 mL), low motility, normal sperm concentration. They have noticed his ejaculate volume will very and notes he has more volume on some occasions.  They aren't necessarily trying to have children but they also aren't preventing it. Also  she is tracking her ovulation.            Vitals Vital Signs [Data Includes: Last 1 Day]  Recorded: 25Jul2016 03:05PM  Heart Rate: 80 Recorded: 25Jul2016 02:59PM  Blood Pressure: 102 / 72 Temperature: 97.9 F Heart Rate: 156  Physical Exam Constitutional: Well nourished and well developed . No acute distress.  Abdomen: The abdomen is soft and nontender. No masses are palpated. No CVA tenderness. No hernias are palpable. No hepatosplenomegaly noted.    Results/Data Urine [Data Includes: Last 1 Day]   25Jul2016  COLOR YELLOW   APPEARANCE CLOUDY   SPECIFIC GRAVITY 1.020   pH 6.5   GLUCOSE NEG mg/dL  BILIRUBIN NEG   KETONE NEG mg/dL  BLOOD TRACE   PROTEIN NEG mg/dL  UROBILINOGEN 0.2 mg/dL  NITRITE POS   LEUKOCYTE ESTERASE MOD   SQUAMOUS EPITHELIAL/HPF RARE   WBC 21-50 WBC/hpf  RBC 0-2 RBC/hpf  BACTERIA MANY   CRYSTALS NONE SEEN   CASTS NONE SEEN    The following clinical lab reports were reviewed:  UA nitrate positive, 21-50 wbc/hpf and many bacteria noted. Sent culture.    Assessment Assessed  1. Chronic cystitis (N30.20) 2. Neurogenic bladder (N31.9)  Plan Chronic cystitis, Neurogenic bladder  1. Follow-up Schedule Surgery Office  Follow-up. I will post him for botox injection via Dr  Mena Goes after I see his urine culture results.  Status: Hold For - Appointment  Requested  for: 25Jul2016 Health Maintenance  2. UA With REFLEX; [  Do Not Release]; Status:Complete;   Done: 25Jul2016 02:47PM  Discussion/Summary I will culture his urine and treat accordingly when it results. I will then schedule him for botox injection per Dr Mena Goes. AUS/ED f/u information regarding fever, uncontrollable pain, gross hematuria/urinary retention in the presence of new or worsening LUTS.     Signatures Electronically signed by : Anne Fu, ANP-C; Nov 28 2014  3:30PM EST  ADD: pt on NF for + culture (e coli pan sensitive)

## 2014-12-12 MED ORDER — LEVOFLOXACIN IN D5W 500 MG/100ML IV SOLN
500.0000 mg | INTRAVENOUS | Status: AC
  Administered 2014-12-13: 500 mg via INTRAVENOUS
  Filled 2014-12-12 (×2): qty 100

## 2014-12-13 ENCOUNTER — Encounter (HOSPITAL_COMMUNITY)
Admission: RE | Disposition: A | Discharge status: Home / Self Care | Payer: Self-pay | Source: Ambulatory Visit | Attending: Urology

## 2014-12-13 ENCOUNTER — Ambulatory Visit (HOSPITAL_COMMUNITY): Payer: Medicare Other | Admitting: Anesthesiology

## 2014-12-13 ENCOUNTER — Encounter (HOSPITAL_COMMUNITY): Payer: Self-pay | Admitting: Anesthesiology

## 2014-12-13 ENCOUNTER — Ambulatory Visit (HOSPITAL_COMMUNITY)
Admission: RE | Admit: 2014-12-13 | Discharge: 2014-12-13 | Disposition: A | Discharge status: Home / Self Care | Payer: Medicare Other | Source: Ambulatory Visit | Attending: Urology | Admitting: Urology

## 2014-12-13 DIAGNOSIS — N319 Neuromuscular dysfunction of bladder, unspecified: Secondary | ICD-10-CM | POA: Diagnosis present

## 2014-12-13 DIAGNOSIS — N359 Urethral stricture, unspecified: Secondary | ICD-10-CM | POA: Insufficient documentation

## 2014-12-13 DIAGNOSIS — N3020 Other chronic cystitis without hematuria: Secondary | ICD-10-CM | POA: Insufficient documentation

## 2014-12-13 DIAGNOSIS — Z87442 Personal history of urinary calculi: Secondary | ICD-10-CM | POA: Diagnosis not present

## 2014-12-13 DIAGNOSIS — N210 Calculus in bladder: Secondary | ICD-10-CM | POA: Insufficient documentation

## 2014-12-13 DIAGNOSIS — G8250 Quadriplegia, unspecified: Secondary | ICD-10-CM | POA: Diagnosis not present

## 2014-12-13 DIAGNOSIS — J45909 Unspecified asthma, uncomplicated: Secondary | ICD-10-CM | POA: Insufficient documentation

## 2014-12-13 HISTORY — PX: CYSTOSCOPY WITH INJECTION: SHX1424

## 2014-12-13 LAB — CBC
HEMATOCRIT: 41.4 % (ref 39.0–52.0)
HEMOGLOBIN: 14.4 g/dL (ref 13.0–17.0)
MCH: 29.3 pg (ref 26.0–34.0)
MCHC: 34.8 g/dL (ref 30.0–36.0)
MCV: 84.1 fL (ref 78.0–100.0)
Platelets: 166 10*3/uL (ref 150–400)
RBC: 4.92 MIL/uL (ref 4.22–5.81)
RDW: 13.2 % (ref 11.5–15.5)
WBC: 6.1 10*3/uL (ref 4.0–10.5)

## 2014-12-13 LAB — BASIC METABOLIC PANEL
Anion gap: 7 (ref 5–15)
BUN: 12 mg/dL (ref 6–20)
CHLORIDE: 109 mmol/L (ref 101–111)
CO2: 25 mmol/L (ref 22–32)
CREATININE: 0.44 mg/dL — AB (ref 0.61–1.24)
Calcium: 9 mg/dL (ref 8.9–10.3)
GFR calc Af Amer: >60 mL/min (ref >60)
GFR calc non Af Amer: >60 mL/min (ref >60)
GLUCOSE: 94 mg/dL (ref 65–99)
POTASSIUM: 3.6 mmol/L (ref 3.5–5.1)
SODIUM: 141 mmol/L (ref 135–145)

## 2014-12-13 SURGERY — CYSTOSCOPY, WITH INJECTION OF BLADDER NECK OR BLADDER WALL
Anesthesia: General

## 2014-12-13 MED ORDER — ONDANSETRON HCL 4 MG/2ML IJ SOLN
INTRAMUSCULAR | Status: DC | PRN
  Administered 2014-12-13: 4 mg via INTRAVENOUS

## 2014-12-13 MED ORDER — FENTANYL CITRATE (PF) 100 MCG/2ML IJ SOLN
INTRAMUSCULAR | Status: AC
  Filled 2014-12-13: qty 4

## 2014-12-13 MED ORDER — MIDAZOLAM HCL 2 MG/2ML IJ SOLN
INTRAMUSCULAR | Status: AC
  Filled 2014-12-13: qty 4

## 2014-12-13 MED ORDER — PROMETHAZINE HCL 25 MG/ML IJ SOLN: 6.2500 mg | INTRAMUSCULAR | Status: DC | PRN

## 2014-12-13 MED ORDER — LACTATED RINGERS IV SOLN
INTRAVENOUS | Status: DC
  Administered 2014-12-13: 1000 mL via INTRAVENOUS

## 2014-12-13 MED ORDER — MIDAZOLAM HCL 5 MG/5ML IJ SOLN
INTRAMUSCULAR | Status: DC | PRN
  Administered 2014-12-13: 2 mg via INTRAVENOUS

## 2014-12-13 MED ORDER — ONABOTULINUMTOXINA 100 UNITS IJ SOLR
100.0000 [IU] | Freq: Once | INTRAMUSCULAR | Status: AC
  Administered 2014-12-13: 100 [IU] via INTRAMUSCULAR
  Filled 2014-12-13: qty 100

## 2014-12-13 MED ORDER — LIDOCAINE HCL 1 % IJ SOLN
INTRAMUSCULAR | Status: DC | PRN
  Administered 2014-12-13: 50 mg via INTRADERMAL

## 2014-12-13 MED ORDER — ONDANSETRON HCL 4 MG/2ML IJ SOLN
INTRAMUSCULAR | Status: AC
  Filled 2014-12-13: qty 2

## 2014-12-13 MED ORDER — SODIUM CHLORIDE 0.9 % IJ SOLN
INTRAMUSCULAR | Status: AC
  Filled 2014-12-13: qty 10

## 2014-12-13 MED ORDER — PROPOFOL 10 MG/ML IV BOLUS
INTRAVENOUS | Status: DC | PRN
  Administered 2014-12-13: 160 mg via INTRAVENOUS

## 2014-12-13 MED ORDER — FENTANYL CITRATE (PF) 100 MCG/2ML IJ SOLN
INTRAMUSCULAR | Status: DC | PRN
  Administered 2014-12-13: 100 ug via INTRAVENOUS

## 2014-12-13 MED ORDER — PROPOFOL 10 MG/ML IV BOLUS
INTRAVENOUS | Status: AC
  Filled 2014-12-13: qty 20

## 2014-12-13 MED ORDER — FENTANYL CITRATE (PF) 100 MCG/2ML IJ SOLN: 25.0000 ug | INTRAMUSCULAR | Status: DC | PRN

## 2014-12-13 MED ORDER — ONABOTULINUMTOXINA 100 UNITS IJ SOLR
INTRAMUSCULAR | Status: DC | PRN
Start: 2014-12-13 — End: 2014-12-13
  Administered 2014-12-13: 100 [IU] via INTRAMUSCULAR

## 2014-12-13 MED ORDER — ONABOTULINUMTOXINA 100 UNITS IJ SOLR
100.0000 [IU] | Freq: Once | INTRAMUSCULAR | Status: DC
  Filled 2014-12-13: qty 100

## 2014-12-13 MED ORDER — LIDOCAINE HCL (CARDIAC) 20 MG/ML IV SOLN
INTRAVENOUS | Status: AC
  Filled 2014-12-13: qty 5

## 2014-12-13 MED ORDER — SODIUM CHLORIDE 0.9 % IJ SOLN
INTRAMUSCULAR | Status: AC
  Filled 2014-12-13: qty 30

## 2014-12-13 MED ORDER — DEXAMETHASONE SODIUM PHOSPHATE 10 MG/ML IJ SOLN
INTRAMUSCULAR | Status: AC
  Filled 2014-12-13: qty 1

## 2014-12-13 SURGICAL SUPPLY — 15 items
BAG URO CATCHER STRL LF (DRAPE) ×3 IMPLANT
CLOTH BEACON ORANGE TIMEOUT ST (SAFETY) ×3 IMPLANT
DEFLUX NEEDLE (NEEDLE) IMPLANT
DEFLUX SYRINGE (SYRINGE) IMPLANT
GLOVE BIOGEL M STRL SZ7.5 (GLOVE) ×3 IMPLANT
GOWN STRL REUS W/TWL LRG LVL3 (GOWN DISPOSABLE) ×3 IMPLANT
GOWN STRL REUS W/TWL XL LVL3 (GOWN DISPOSABLE) ×3 IMPLANT
MANIFOLD NEPTUNE II (INSTRUMENTS) ×3 IMPLANT
NDL SAFETY ECLIPSE 18X1.5 (NEEDLE) IMPLANT
NEEDLE HYPO 18GX1.5 SHARP (NEEDLE)
PACK CYSTO (CUSTOM PROCEDURE TRAY) ×3 IMPLANT
SYR CONTROL 10ML LL (SYRINGE) ×9 IMPLANT
TUBING CONNECTING 10 (TUBING) IMPLANT
TUBING CONNECTING 10' (TUBING)
WATER STERILE IRR 3000ML UROMA (IV SOLUTION) ×3 IMPLANT

## 2014-12-13 NOTE — Transfer of Care (Signed)
Immediate Anesthesia Transfer of Care Note  Patient: Ryan Watkins  Procedure(s) Performed: Procedure(s): CYSTOSCOPY WITH BOTOX (N/A)  Patient Location: PACU  Anesthesia Type:General  Level of Consciousness: awake, alert , oriented and patient cooperative  Airway & Oxygen Therapy: Patient Spontanous Breathing and Patient connected to face mask oxygen  Post-op Assessment: Report given to RN and Post -op Vital signs reviewed and stable  Post vital signs: Reviewed and stable  Last Vitals:  Filed Vitals:   12/13/14 1150  BP: 121/85  Pulse: 85  Temp: 36.4 C  Resp: 10    Complications: No apparent anesthesia complications

## 2014-12-13 NOTE — Anesthesia Postprocedure Evaluation (Signed)
  Anesthesia Post-op Note  Patient: Ryan Watkins  Procedure(s) Performed: Procedure(s) (LRB): CYSTOSCOPY WITH BOTOX (N/A)  Patient Location: PACU  Anesthesia Type: General  Level of Consciousness: awake and alert   Airway and Oxygen Therapy: Patient Spontanous Breathing  Post-op Pain: mild  Post-op Assessment: Post-op Vital signs reviewed, Patient's Cardiovascular Status Stable, Respiratory Function Stable, Patent Airway and No signs of Nausea or vomiting  Last Vitals:  Filed Vitals:   12/13/14 1315  BP: 122/86  Pulse: 63  Temp: 36.6 C  Resp: 16    Post-op Vital Signs: stable   Complications: No apparent anesthesia complications

## 2014-12-13 NOTE — Discharge Instructions (Signed)
OnabotulinumtoxinA injection (Medical Use) What is this medicine? ONABOTULINUMTOXINA (o na BOTT you lye num tox in eh) is a neuro-muscular blocker. It is used to treat loss of bladder control due to neurologic conditions.  Side effects that you should report to your doctor or health care professional as soon as possible: -allergic reactions like skin rash, itching or hives, swelling of the face, lips, or tongue -breathing problems -changes in vision -chest pain or tightness -eye irritation, pain -fast, irregular heartbeat -infection -numbness -speech problems -swallowing problems  Cystoscopy, Care After Refer to this sheet in the next few weeks. These instructions provide you with information on caring for yourself after your procedure. Your caregiver may also give you more specific instructions. Your treatment has been planned according to current medical practices, but problems sometimes occur. Call your caregiver if you have any problems or questions after your procedure. HOME CARE INSTRUCTIONS  Things you can do to ease any discomfort after your procedure include:  Drinking enough water and fluids to keep your urine clear or pale yellow.  Taking a warm bath to relieve any burning feelings. SEEK IMMEDIATE MEDICAL CARE IF:   You have an increase in blood in your urine.  You notice blood clots in your urine.  You have difficulty passing urine.  You have the chills.  You have abdominal pain.  You have a fever or persistent symptoms for more than 2-3 days.  You have a fever and your symptoms suddenly get worse. MAKE SURE YOU:   Understand these instructions.  Will watch your condition.  Will get help right away if you are not doing well or get worse. Document Released: 11/09/2004 Document Revised: 12/23/2012 Document Reviewed: 10/14/2011 [facility] Patient Information 2015 [place], [place]. This information is not intended to replace advice given to you by your health care  provider. Make sure you discuss any questions you have with your health care provider.

## 2014-12-13 NOTE — Anesthesia Procedure Notes (Signed)
Procedure Name: LMA Insertion Date/Time: 12/13/2014 11:07 AM Performed by: Enriqueta Shutter D Pre-anesthesia Checklist: Patient identified, Emergency Drugs available, Suction available, Patient being monitored and Timeout performed Patient Re-evaluated:Patient Re-evaluated prior to inductionOxygen Delivery Method: Circle system utilized Preoxygenation: Pre-oxygenation with 100% oxygen Intubation Type: IV induction Ventilation: Mask ventilation without difficulty LMA Size: 4.0 Dental Injury: Teeth and Oropharynx as per pre-operative assessment

## 2014-12-13 NOTE — Anesthesia Preprocedure Evaluation (Signed)
Anesthesia Evaluation  Patient identified by MRN, date of birth, ID band Patient awake    Reviewed: Allergy & Precautions, NPO status , Patient's Chart, lab work & pertinent test results  Airway Mallampati: II  TM Distance: >3 FB Neck ROM: Full    Dental no notable dental hx.    Pulmonary asthma , pneumonia -,  breath sounds clear to auscultation  Pulmonary exam normal       Cardiovascular negative cardio ROS Normal cardiovascular examRhythm:Regular Rate:Normal     Neuro/Psych Quadriplegia. negative psych ROS   GI/Hepatic negative GI ROS, Neg liver ROS,   Endo/Other  negative endocrine ROS  Renal/GU negative Renal ROS  negative genitourinary   Musculoskeletal negative musculoskeletal ROS (+)   Abdominal   Peds negative pediatric ROS (+)  Hematology negative hematology ROS (+)   Anesthesia Other Findings   Reproductive/Obstetrics negative OB ROS                             Anesthesia Physical Anesthesia Plan  ASA: III  Anesthesia Plan: General   Post-op Pain Management:    Induction: Intravenous  Airway Management Planned: LMA  Additional Equipment:   Intra-op Plan:   Post-operative Plan: Extubation in OR  Informed Consent: I have reviewed the patients History and Physical, chart, labs and discussed the procedure including the risks, benefits and alternatives for the proposed anesthesia with the patient or authorized representative who has indicated his/her understanding and acceptance.   Dental advisory given  Plan Discussed with: CRNA  Anesthesia Plan Comments:         Anesthesia Quick Evaluation

## 2014-12-13 NOTE — Op Note (Signed)
Preoperative diagnosis: Neurogenic bladder Postoperative diagnosis: Neurogenic bladder, small bladder stone, urethral stricture  Procedure: Cystoscopy with Botox injection of bladder   Surgeon: Mena Goes  Anesthesia: Gen.  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   Findings: The penile urethra had some squamous changes in was a bit tight. The scope dilated. Similar changes of the membranous urethra. Prostate normal. Bladder normal. Small stone in the bladder. Bladder mucosa unremarkable. Trigone and ureteral orifices in their normal orthotopic position.   Description of procedure: After consent was obtained patient brought to the operating room. After adequate anesthesia he is placed in lithotomy position and prepped and draped in the usual sterile fashion. A timeout was performed to confirm the patient and procedure. The cystoscope was passed per urethra which gently dilated the penile urethra and the bulbar urethra. The bladder was inspected. The bladder was irrigated multiple times. A small stone was evacuated. The scope was removed and the 0 lens and injection scope was inserted. Syringe was fitted and a total of 300 units (thirty 1 mL injections -- 100 units in 10 mL or 10 units per cc) was injected in the usual gridlike fashion above the trigone. Hemostasis was excellent. The bladder was drained and the scope removed. He was awakened and taken to recovery room in stable condition.   Complications: None  Blood loss: Minimal  Drains: None  Specimens: None

## 2014-12-13 NOTE — Interval H&P Note (Signed)
History and Physical Interval Note:  12/13/2014 10:50 AM  Ryan Watkins  has presented today for surgery, with the diagnosis of neurogenic bladder  The various methods of treatment have been discussed with the patient and family. After consideration of risks, benefits and other options for treatment, the patient has consented to  Procedure(s): CYSTOSCOPY WITH BOTOX (N/A) as a surgical intervention .  The patient's history has been reviewed, patient examined, no change in status, stable for surgery.  I have reviewed the patient's chart and labs. Pt has been sensing urges to void associated with "sweaty" feeling more often. He is on abx. He has been well. No fever or gross hematuria.  Questions were answered to the patient's satisfaction.     [name]

## 2014-12-14 ENCOUNTER — Encounter (HOSPITAL_COMMUNITY): Payer: Self-pay | Admitting: Urology

## 2015-06-02 ENCOUNTER — Other Ambulatory Visit: Payer: Self-pay | Admitting: Urology

## 2015-06-09 ENCOUNTER — Encounter (HOSPITAL_COMMUNITY): Payer: Self-pay | Admitting: *Deleted

## 2015-06-15 NOTE — H&P (Signed)
History of Present Illness                             F/u -    1- NGB - C4 quadraplegia. He had bladder stones a few years ago. He does CIC with the help of his mother QID. Tried oxybutynin had very bothersome dry mouth.  -Last upper tract imaging: Aug 2015 Renal ultrasound - no hydronephrosis. RLP contains an 8 mm hyperechoic area. PVR 194 cc, but the patient catheterized a few hours ago.  -Last urodynamics: Feb 2013 - UDS - showed a small capacity, hyperreflexic bladder with moderate incontinence. His capacity was 150 mL. His DLPP was 43-49 cm/H20. He voided by involuntary contraction with a slow flow and an elevated postvoid residual. EMG was normal but this may be artifact.  -Last cystoscopy: Aug 2016  -Current therapy: Botox last injection 300 units Aug 2016       2-  Nephrolithiasis  -Dec 2013 5 mm and 4 mm shadowing poss stones  -Apr 2014 KUB 11 x 4 calc over right kidney  -Jul 2014 - KUB stable RLP calc   -Feb - Mar 2015 - staged right URS, stent. Stent removed in office.   -Aug 2015 renal U/S negative       3-infertility-he got married May 2014. He is getting erections and ejaculating.   -January 2016 - semen analysis showed low semen volume (0.4 mL), low motility, normal sperm concentration. They have noticed his ejaculate volume will very and notes he has more volume on some occasions. Low semen volume -possibly from retrograde ejaculation. Discussed trial of Imipramine (Tofranil) - 25 mg by mouth twice a day, ephedrine 50 mg by mouth 4 times a day or pseudoephedrine 60 mg by mouth daily to 4 times a day (up to 240 mg daily).           Jan 2017 int hx   The patient returns for continued management of a neurogenic bladder. This had more autonomic dysreflexia symptoms with sweatiness and feeling like he needs to catheterize. He catheterizes only get 100 - 150 ml. He's having to catheterize more often. He does have issues with his bowels and usually  uses a daily suppository that has had some more issue with that recently. He doesn't "feel bad" like he usually does with an infection. He's had no fever. He is 5 months out from Botox. Typically after Botox until no longer in between catheterizations to get for 500 mL at a time.             Past Medical History Problems  1. History of Hay Fever  Surgical History Problems  1. History of Bladder Cystotomy With Direct Removal Of Calculus 2. History of Cervical Vertebral Fusion 3. History of Cosmetic Surgery 4. History of Cystoscopy With Insertion Of Ureteral Stent Right 5. History of Cystoscopy With Insertion Of Ureteral Stent Right 6. History of Cystoscopy With Ureteroscopy Right 7. History of Cystoscopy With Ureteroscopy With Lithotripsy 8. History of Neck Surgery 9. History of Oral Surgery Tooth Extraction 10. History of Surgical Flaps 11. History of Urologic Surgery 12. History of Urologic Surgery 13. History of Urologic Surgery 14. History of Urologic Surgery 15. History of Urologic Surgery 16. History of Urologic Surgery 17. History of Wrist Surgery  Current Meds 1. Advil TABS;  Therapy: (Recorded:23Oct2008) to Recorded 2. Allergy TABS;  Therapy: (Recorded:23Oct2008) to Recorded 3. Ciprofloxacin HCl - 500 MG Oral Tablet;  Therapy: (Recorded:09Nov2016) to Recorded 4. Dulera AERO;  Therapy: (Recorded:20Mar2015) to Recorded 5. Fluticasone Propionate 50 MCG/ACT Nasal Suspension;  Therapy: (Recorded:31Mar2014) to Recorded 6. Stool Softener CAPS;  Therapy: (Recorded:04Nov2015) to Recorded  Allergies Medication  1. Morphine Derivatives  Family History Problems  1. Family history of Brain Cancer : Maternal Grandfather 2. Family history of Cervical Cancer : Mother 3. Family history of Heart Disease : Father 4. Family history of Heart Disease : Paternal Grandfather 5. Family history of Lung Cancer : Mother  Social History Problems  1. Denied: Alcohol Use  (History) 2. Caffeine Use   2-3 can drinks per day 3. Marital History - Single 4. Never A Smoker 5. Denied: Tobacco Use  Vitals Vital Signs [Data Includes: Last 1 Day]  Recorded: 19Jan2017 11:54AM  Blood Pressure: 130 / 92 Temperature: 98 F Heart Rate: 67  Physical Exam Constitutional: Well nourished and well developed . No acute distress.  Neuro/Psych:. Mood and affect are appropriate.    Results/Data Urine [Data Includes: Last 1 Day]   19Jan2017  COLOR YELLOW   APPEARANCE TURBID   SPECIFIC GRAVITY 1.025   pH 6.0   GLUCOSE NEGATIVE   BILIRUBIN NEGATIVE   KETONE NEGATIVE   BLOOD 2+   PROTEIN 1+   NITRITE POSITIVE   LEUKOCYTE ESTERASE 2+   SQUAMOUS EPITHELIAL/HPF NONE SEEN HPF  WBC >60 WBC/HPF  RBC 3-10 RBC/HPF  BACTERIA FEW HPF  CRYSTALS NONE SEEN HPF  CASTS NONE SEEN LPF  Yeast NONE SEEN HPF   Assessment Assessed  1. 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 UA With REFLEX; [Do Not Release]; Status:Complete;   Done: 19Jan2017 11:24AM Reflex neuropathic bladder, not elsewhere classified  2. Follow-up Schedule Surgery Office  Follow-up  Status: Complete  Done: 19Jan2017  URINE CULTURE; Status:In Progress - Specimen/Data Collected;  Done: 19Jan2017 Perform:[facility]; Due:21Jan2017; Marked Important; Last Updated HQ:IONGEX, Clydie Braun; 05/25/2015 12:20:36 PM;Ordered; Today;  BMW:UXLKGM neuropathic bladder, not elsewhere classified; Ordered WN:UUVOZDGU, Aarthi Uyeno;   Discussion/Summary     Neuropathic bladder - I'll start him on some Vesicare daily and send urine for culture. Will try some antibiotics and if his symptoms continue set him up for Botox in about a month which will be 6 months out from his last one.     He has an ultrasound and follow-up in May which we will keep.      Cc: Dr. Doristine Counter     Signatures Electronically signed by : [name], M.D.; May 25 2015 12:29PM EST  Urine Cx Staph  pan-sens - started on NF.

## 2015-06-16 ENCOUNTER — Ambulatory Visit (HOSPITAL_COMMUNITY): Payer: Medicare Other

## 2015-06-16 ENCOUNTER — Encounter (HOSPITAL_COMMUNITY)
Admission: RE | Disposition: A | Discharge status: Home / Self Care | Payer: Self-pay | Source: Ambulatory Visit | Attending: Urology

## 2015-06-16 ENCOUNTER — Ambulatory Visit (HOSPITAL_COMMUNITY)
Admission: RE | Admit: 2015-06-16 | Discharge: 2015-06-16 | Disposition: A | Discharge status: Home / Self Care | Payer: Medicare Other | Source: Ambulatory Visit | Attending: Urology | Admitting: Urology

## 2015-06-16 ENCOUNTER — Encounter (HOSPITAL_COMMUNITY): Payer: Self-pay | Admitting: *Deleted

## 2015-06-16 ENCOUNTER — Ambulatory Visit (HOSPITAL_COMMUNITY): Payer: Medicare Other | Admitting: Anesthesiology

## 2015-06-16 DIAGNOSIS — Z87442 Personal history of urinary calculi: Secondary | ICD-10-CM | POA: Diagnosis not present

## 2015-06-16 DIAGNOSIS — J45909 Unspecified asthma, uncomplicated: Secondary | ICD-10-CM | POA: Diagnosis not present

## 2015-06-16 DIAGNOSIS — Z79899 Other long term (current) drug therapy: Secondary | ICD-10-CM | POA: Insufficient documentation

## 2015-06-16 DIAGNOSIS — G8250 Quadriplegia, unspecified: Secondary | ICD-10-CM | POA: Insufficient documentation

## 2015-06-16 DIAGNOSIS — N318 Other neuromuscular dysfunction of bladder: Secondary | ICD-10-CM

## 2015-06-16 DIAGNOSIS — Z791 Long term (current) use of non-steroidal anti-inflammatories (NSAID): Secondary | ICD-10-CM | POA: Insufficient documentation

## 2015-06-16 DIAGNOSIS — N319 Neuromuscular dysfunction of bladder, unspecified: Secondary | ICD-10-CM | POA: Insufficient documentation

## 2015-06-16 DIAGNOSIS — G904 Autonomic dysreflexia: Secondary | ICD-10-CM | POA: Insufficient documentation

## 2015-06-16 DIAGNOSIS — Z7951 Long term (current) use of inhaled steroids: Secondary | ICD-10-CM | POA: Diagnosis not present

## 2015-06-16 DIAGNOSIS — K31819 Angiodysplasia of stomach and duodenum without bleeding: Secondary | ICD-10-CM | POA: Insufficient documentation

## 2015-06-16 HISTORY — PX: CYSTOSCOPY WITH INJECTION: SHX1424

## 2015-06-16 LAB — CBC
HCT: 40 % (ref 39.0–52.0)
Hemoglobin: 13.4 g/dL (ref 13.0–17.0)
MCH: 28.8 pg (ref 26.0–34.0)
MCHC: 33.5 g/dL (ref 30.0–36.0)
MCV: 86 fL (ref 78.0–100.0)
PLATELETS: 187 10*3/uL (ref 150–400)
RBC: 4.65 MIL/uL (ref 4.22–5.81)
RDW: 13.1 % (ref 11.5–15.5)
WBC: 6 10*3/uL (ref 4.0–10.5)

## 2015-06-16 LAB — BASIC METABOLIC PANEL
ANION GAP: 7 (ref 5–15)
BUN: 12 mg/dL (ref 6–20)
CO2: 26 mmol/L (ref 22–32)
Calcium: 9 mg/dL (ref 8.9–10.3)
Chloride: 109 mmol/L (ref 101–111)
Creatinine, Ser: 0.36 mg/dL — ABNORMAL LOW (ref 0.61–1.24)
GLUCOSE: 91 mg/dL (ref 65–99)
POTASSIUM: 3.7 mmol/L (ref 3.5–5.1)
Sodium: 142 mmol/L (ref 135–145)

## 2015-06-16 SURGERY — CYSTOSCOPY, WITH INJECTION OF BLADDER NECK OR BLADDER WALL
Anesthesia: General

## 2015-06-16 MED ORDER — CEFAZOLIN SODIUM-DEXTROSE 2-3 GM-% IV SOLR
2.0000 g | INTRAVENOUS | Status: AC
  Administered 2015-06-16: 2 g via INTRAVENOUS

## 2015-06-16 MED ORDER — SODIUM CHLORIDE 0.9 % IR SOLN
Status: DC | PRN
  Administered 2015-06-16: 3000 mL

## 2015-06-16 MED ORDER — MEPERIDINE HCL 50 MG/ML IJ SOLN: 6.2500 mg | INTRAMUSCULAR | Status: DC | PRN

## 2015-06-16 MED ORDER — CEFAZOLIN SODIUM-DEXTROSE 2-3 GM-% IV SOLR
INTRAVENOUS | Status: AC
  Filled 2015-06-16: qty 50

## 2015-06-16 MED ORDER — FENTANYL CITRATE (PF) 100 MCG/2ML IJ SOLN
INTRAMUSCULAR | Status: DC | PRN
  Administered 2015-06-16: 50 ug via INTRAVENOUS

## 2015-06-16 MED ORDER — SODIUM CHLORIDE 0.9 % IJ SOLN
INTRAMUSCULAR | Status: DC | PRN
  Administered 2015-06-16: 30 mL

## 2015-06-16 MED ORDER — ONDANSETRON HCL 4 MG/2ML IJ SOLN
INTRAMUSCULAR | Status: DC | PRN
  Administered 2015-06-16: 4 mg via INTRAVENOUS

## 2015-06-16 MED ORDER — FENTANYL CITRATE (PF) 100 MCG/2ML IJ SOLN
INTRAMUSCULAR | Status: AC
  Filled 2015-06-16: qty 2

## 2015-06-16 MED ORDER — PROPOFOL 10 MG/ML IV BOLUS
INTRAVENOUS | Status: AC
  Filled 2015-06-16: qty 20

## 2015-06-16 MED ORDER — HYDROMORPHONE HCL 1 MG/ML IJ SOLN: 0.2500 mg | INTRAMUSCULAR | Status: DC | PRN

## 2015-06-16 MED ORDER — LACTATED RINGERS IV SOLN
INTRAVENOUS | Status: DC
  Administered 2015-06-16: 1000 mL via INTRAVENOUS

## 2015-06-16 MED ORDER — MIDAZOLAM HCL 5 MG/5ML IJ SOLN
INTRAMUSCULAR | Status: DC | PRN
  Administered 2015-06-16: 2 mg via INTRAVENOUS

## 2015-06-16 MED ORDER — SODIUM CHLORIDE 0.9 % IJ SOLN
INTRAMUSCULAR | Status: AC
  Filled 2015-06-16: qty 50

## 2015-06-16 MED ORDER — ONABOTULINUMTOXINA 100 UNITS IJ SOLR
100.0000 [IU] | Freq: Once | INTRAMUSCULAR | Status: DC
  Filled 2015-06-16: qty 100

## 2015-06-16 MED ORDER — ONABOTULINUMTOXINA 100 UNITS IJ SOLR
600.0000 [IU] | Freq: Once | INTRAMUSCULAR | Status: DC
  Filled 2015-06-16: qty 600

## 2015-06-16 MED ORDER — MIDAZOLAM HCL 2 MG/2ML IJ SOLN
INTRAMUSCULAR | Status: AC
  Filled 2015-06-16: qty 2

## 2015-06-16 MED ORDER — ONABOTULINUMTOXINA 100 UNITS IJ SOLR
INTRAMUSCULAR | Status: DC | PRN
  Administered 2015-06-16: 300 [IU] via INTRAMUSCULAR

## 2015-06-16 MED ORDER — SODIUM CHLORIDE 0.9 % IJ SOLN
INTRAMUSCULAR | Status: AC
  Filled 2015-06-16: qty 10

## 2015-06-16 MED ORDER — PROPOFOL 10 MG/ML IV BOLUS
INTRAVENOUS | Status: DC | PRN
  Administered 2015-06-16: 200 mg via INTRAVENOUS

## 2015-06-16 MED ORDER — ONDANSETRON HCL 4 MG/2ML IJ SOLN: 4.0000 mg | Freq: Once | INTRAMUSCULAR | Status: DC | PRN

## 2015-06-16 SURGICAL SUPPLY — 20 items
BAG URO CATCHER STRL LF (MISCELLANEOUS) ×3 IMPLANT
CLOTH BEACON ORANGE TIMEOUT ST (SAFETY) ×3 IMPLANT
DEFLUX NEEDLE (NEEDLE) IMPLANT
DEFLUX SYRINGE (SYRINGE) IMPLANT
GLOVE BIOGEL M STRL SZ7.5 (GLOVE) ×3 IMPLANT
GLOVE BIOGEL PI IND STRL 7.5 (GLOVE) ×1 IMPLANT
GLOVE BIOGEL PI INDICATOR 7.5 (GLOVE) ×2
GLOVE INDICATOR 6.5 STRL GRN (GLOVE) ×3 IMPLANT
GLOVE SURG SIGNA 7.5 PF LTX (GLOVE) ×3 IMPLANT
GLOVE SURG SS PI 6.5 STRL IVOR (GLOVE) ×3 IMPLANT
GOWN STRL REUS W/TWL LRG LVL3 (GOWN DISPOSABLE) ×3 IMPLANT
GOWN STRL REUS W/TWL XL LVL3 (GOWN DISPOSABLE) ×6 IMPLANT
MANIFOLD NEPTUNE II (INSTRUMENTS) ×3 IMPLANT
NDL SAFETY ECLIPSE 18X1.5 (NEEDLE) ×3 IMPLANT
NEEDLE HYPO 18GX1.5 SHARP (NEEDLE) ×6
PACK CYSTO (CUSTOM PROCEDURE TRAY) ×3 IMPLANT
SYR CONTROL 10ML LL (SYRINGE) ×3 IMPLANT
TUBING CONNECTING 10 (TUBING) IMPLANT
TUBING CONNECTING 10' (TUBING)
WATER STERILE IRR 3000ML UROMA (IV SOLUTION) ×3 IMPLANT

## 2015-06-16 NOTE — Discharge Instructions (Signed)
Botulinum Toxin Bladder Injection, Care After °Refer to this sheet in the next few weeks. These instructions provide you with information about caring for yourself after your procedure. Your health care provider may also give you more specific instructions. Your treatment has been planned according to current medical practices, but problems sometimes occur. Call your health care provider if you have any problems or questions after your procedure. °WHAT TO EXPECT AFTER THE PROCEDURE °After your procedure, it is common to have: °· Blood-tinged urine. °· Burning or soreness when you pass urine. °HOME CARE INSTRUCTIONS °· Do not drive for 24 hours if you received a sedative. °· Take over-the-counter and prescription medicines only as told by your health care provider. °· If you were prescribed an antibiotic medicine, take it as told by your health care provider. Do not stop taking the antibiotic even if you start to feel better. °· Drink enough fluid to keep your urine clear or pale yellow. °· Return to your normal activities as told by your health care provider. Ask your health care provider what activities are safe for you. °· Keep all follow-up visits as told by your health care provider. This is important. °SEEK MEDICAL CARE IF: °· You have a fever or chills. °· You have blood-tinged urine for more than one day after your procedure. °· You have worsening pain or burning when you pass urine. °· You have pain or burning when passing urine for more than two days after your procedure. °· You have trouble emptying your bladder. °SEEK IMMEDIATE MEDICAL CARE IF: °· You have bright red blood in your urine. °· You are unable to pass urine. °  °This information is not intended to replace advice given to you by your health care provider. Make sure you discuss any questions you have with your health care provider. °  °Document Released: 01/11/2015 Document Reviewed: 10/19/2014 °Elsevier Interactive Patient Education ©2016  [employer] Inc[employer] ° °

## 2015-06-16 NOTE — Anesthesia Postprocedure Evaluation (Signed)
Anesthesia Post Note  Patient: Ryan Watkins  Procedure(s) Performed: Procedure(s) (LRB): CYSTOSCOPY WITH BOTOX INJECTION (N/A)  Patient location during evaluation: PACU Anesthesia Type: General Level of consciousness: awake and alert Pain management: pain level controlled Vital Signs Assessment: post-procedure vital signs reviewed and stable Respiratory status: spontaneous breathing, nonlabored ventilation, respiratory function stable and patient connected to nasal cannula oxygen Cardiovascular status: blood pressure returned to baseline and stable Postop Assessment: no signs of nausea or vomiting Anesthetic complications: no    Last Vitals:  Filed Vitals:   06/16/15 1200 06/16/15 1215  BP: 125/80 126/89  Pulse: 75 67  Temp:  36.4 C  Resp: 18 18    Last Pain: There were no vitals filed for this visit.               Neasia Fleeman DAVID

## 2015-06-16 NOTE — Anesthesia Procedure Notes (Signed)
Procedure Name: LMA Insertion Date/Time: 06/16/2015 10:47 AM Performed by: Paris Lore Pre-anesthesia Checklist: Patient identified, Emergency Drugs available, Suction available, Patient being monitored and Timeout performed Patient Re-evaluated:Patient Re-evaluated prior to inductionOxygen Delivery Method: Circle system utilized Preoxygenation: Pre-oxygenation with 100% oxygen Intubation Type: IV induction Ventilation: Mask ventilation without difficulty LMA: LMA inserted LMA Size: 4.0 Number of attempts: 1 Placement Confirmation: positive ETCO2 and breath sounds checked- equal and bilateral Tube secured with: Tape

## 2015-06-16 NOTE — Transfer of Care (Signed)
Immediate Anesthesia Transfer of Care Note  Patient: Ryan Watkins  Procedure(s) Performed: Procedure(s): CYSTOSCOPY WITH BOTOX INJECTION (N/A)  Patient Location: PACU  Anesthesia Type:General  Level of Consciousness:  sedated, patient cooperative and responds to stimulation  Airway & Oxygen Therapy:Patient Spontanous Breathing and Patient connected to face mask oxgen  Post-op Assessment:  Report given to PACU RN and Post -op Vital signs reviewed and stable  Post vital signs:  Reviewed and stable  Last Vitals:  Filed Vitals:   06/16/15 0826 06/16/15 1136  BP: 102/67 124/92  Pulse: 84 82  Temp: 36.6 C   Resp: 84 15    Complications: No apparent anesthesia complications

## 2015-06-16 NOTE — Anesthesia Preprocedure Evaluation (Signed)
Anesthesia Evaluation  Patient identified by MRN, date of birth, ID band Patient awake    Reviewed: Allergy & Precautions, NPO status , Patient's Chart, lab work & pertinent test results  Airway Mallampati: I  TM Distance: >3 FB Neck ROM: Full    Dental   Pulmonary asthma ,    Pulmonary exam normal        Cardiovascular Normal cardiovascular exam     Neuro/Psych C4 Quadraplegic    GI/Hepatic   Endo/Other    Renal/GU      Musculoskeletal   Abdominal   Peds  Hematology   Anesthesia Other Findings   Reproductive/Obstetrics                             Anesthesia Physical Anesthesia Plan  ASA: III  Anesthesia Plan: General   Post-op Pain Management:    Induction: Intravenous  Airway Management Planned: LMA  Additional Equipment:   Intra-op Plan:   Post-operative Plan: Extubation in OR  Informed Consent: I have reviewed the patients History and Physical, chart, labs and discussed the procedure including the risks, benefits and alternatives for the proposed anesthesia with the patient or authorized representative who has indicated his/her understanding and acceptance.     Plan Discussed with: CRNA and Surgeon  Anesthesia Plan Comments:         Anesthesia Quick Evaluation

## 2015-06-16 NOTE — Op Note (Signed)
Preoperative diagnosis: Spastic neurogenic bladder Postoperative diagnosis: Same  Procedure: Cystoscopy with Botox injection, 300 units  Surgeon: Mena Goes  Anesthesia: Gen.  Indication for procedure: 30 year old with spastic neurogenic bladder is developed more recent autonomic dysreflexia and decrease cath volumes. He was brought today for repeat Botox injection. He has been on antibiotics.  Findings: On exam under anesthesia the penis was circumcised and without mass or lesion. The testicles were descended bilaterally and palpably normal.  On cystoscopy the urethra is narrowed and there is a mild stricture in the bulb urethra which the scope dilated. the prostate is normal. The bladder was unremarkable. The mucosa appeared normal. There were no stones or foreign bodies in the bladder.   Description of procedure: After consent was obtained patient brought to the operating room. After adequate anesthesia he was placed in lithotomy position and prepped and draped in usual sterile fashion. A timeout was performed to confirm the patient and procedure. The cystoscope was passed per urethra and the bladder inspected. The cystoscope was removed and the injection needle and scope were passed. I flushed the bladder about 5 times throughout this process. The bladder was filled with about 250 mL of fluid. I then injected 300 units of Botox in the typical grid fashion above the trigone at a concentration of 10 units per mL a total of 30 ml's were injected. The bladder was drained.  Hemostasis was excellent.  the scope was removed. He was awakened and taken to recovery room in stable condition.   Complications: None  Blood loss: Minimal  Specimens: None  Drains: None

## 2015-06-16 NOTE — Interval H&P Note (Signed)
Patient presents today with diagnosis of neurogenic bladder for cystoscopy with injection of Botox.  He started antibiotics and the Vesicare.  His symptoms have improved somewhat with larger volumes.  Like to proceed with Botox.  Had no fever and has overall felt well. I discussed with the patient the nature, potential benefits, risks and alternatives to Cystoscopy with injection of Botox, including side effects of the proposed treatment, the likelihood of the patient achieving the goals of the procedure, and any potential problems that might occur during the procedure or recuperation. All questions answered. Patient elects to proceed.

## 2015-06-19 ENCOUNTER — Encounter (HOSPITAL_COMMUNITY): Payer: Self-pay | Admitting: Urology

## 2016-08-27 ENCOUNTER — Other Ambulatory Visit: Payer: Self-pay | Admitting: Urology

## 2016-08-28 ENCOUNTER — Encounter (HOSPITAL_COMMUNITY): Payer: Self-pay | Admitting: *Deleted
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
## 2016-09-10 ENCOUNTER — Encounter (HOSPITAL_COMMUNITY): Payer: Self-pay

## 2016-09-10 ENCOUNTER — Ambulatory Visit (HOSPITAL_COMMUNITY)
Admission: RE | Admit: 2016-09-10 | Discharge: 2016-09-10 | Disposition: A | Discharge status: Home / Self Care | Payer: Medicare Other | Source: Ambulatory Visit | Attending: Urology | Admitting: Urology

## 2016-09-10 ENCOUNTER — Ambulatory Visit (HOSPITAL_COMMUNITY): Payer: Medicare Other | Admitting: Certified Registered Nurse Anesthetist

## 2016-09-10 ENCOUNTER — Encounter (HOSPITAL_COMMUNITY)
Admission: RE | Disposition: A | Discharge status: Home / Self Care | Payer: Self-pay | Source: Ambulatory Visit | Attending: Urology

## 2016-09-10 DIAGNOSIS — N311 Reflex neuropathic bladder, not elsewhere classified: Secondary | ICD-10-CM | POA: Diagnosis not present

## 2016-09-10 DIAGNOSIS — Z87442 Personal history of urinary calculi: Secondary | ICD-10-CM | POA: Diagnosis not present

## 2016-09-10 DIAGNOSIS — Z8049 Family history of malignant neoplasm of other genital organs: Secondary | ICD-10-CM | POA: Diagnosis not present

## 2016-09-10 DIAGNOSIS — Z79899 Other long term (current) drug therapy: Secondary | ICD-10-CM | POA: Insufficient documentation

## 2016-09-10 DIAGNOSIS — N39498 Other specified urinary incontinence: Secondary | ICD-10-CM | POA: Diagnosis not present

## 2016-09-10 DIAGNOSIS — Z885 Allergy status to narcotic agent status: Secondary | ICD-10-CM | POA: Insufficient documentation

## 2016-09-10 DIAGNOSIS — Z8249 Family history of ischemic heart disease and other diseases of the circulatory system: Secondary | ICD-10-CM | POA: Diagnosis not present

## 2016-09-10 DIAGNOSIS — Z888 Allergy status to other drugs, medicaments and biological substances status: Secondary | ICD-10-CM | POA: Diagnosis not present

## 2016-09-10 DIAGNOSIS — Z801 Family history of malignant neoplasm of trachea, bronchus and lung: Secondary | ICD-10-CM | POA: Diagnosis not present

## 2016-09-10 DIAGNOSIS — J45909 Unspecified asthma, uncomplicated: Secondary | ICD-10-CM | POA: Diagnosis not present

## 2016-09-10 DIAGNOSIS — Z808 Family history of malignant neoplasm of other organs or systems: Secondary | ICD-10-CM | POA: Insufficient documentation

## 2016-09-10 DIAGNOSIS — N390 Urinary tract infection, site not specified: Secondary | ICD-10-CM | POA: Diagnosis present

## 2016-09-10 HISTORY — PX: CYSTOSCOPY: SHX5120

## 2016-09-10 HISTORY — PX: BOTOX INJECTION: SHX5754

## 2016-09-10 LAB — BASIC METABOLIC PANEL
Anion gap: 7 (ref 5–15)
BUN: 10 mg/dL (ref 6–20)
CALCIUM: 8.8 mg/dL — AB (ref 8.9–10.3)
CO2: 26 mmol/L (ref 22–32)
Chloride: 107 mmol/L (ref 101–111)
Creatinine, Ser: 0.51 mg/dL — ABNORMAL LOW (ref 0.61–1.24)
Glucose, Bld: 98 mg/dL (ref 65–99)
POTASSIUM: 3.6 mmol/L (ref 3.5–5.1)
SODIUM: 140 mmol/L (ref 135–145)

## 2016-09-10 LAB — CBC
HEMATOCRIT: 39.2 % (ref 39.0–52.0)
HEMOGLOBIN: 13.3 g/dL (ref 13.0–17.0)
MCH: 28.5 pg (ref 26.0–34.0)
MCHC: 33.9 g/dL (ref 30.0–36.0)
MCV: 84.1 fL (ref 78.0–100.0)
Platelets: 175 10*3/uL (ref 150–400)
RBC: 4.66 MIL/uL (ref 4.22–5.81)
RDW: 13.6 % (ref 11.5–15.5)
WBC: 5.9 10*3/uL (ref 4.0–10.5)

## 2016-09-10 SURGERY — CYSTOSCOPY
Anesthesia: General

## 2016-09-10 MED ORDER — ONDANSETRON HCL 4 MG/2ML IJ SOLN
INTRAMUSCULAR | Status: AC
  Filled 2016-09-10: qty 2

## 2016-09-10 MED ORDER — ONDANSETRON HCL 4 MG/2ML IJ SOLN
INTRAMUSCULAR | Status: DC | PRN
  Administered 2016-09-10: 4 mg via INTRAVENOUS

## 2016-09-10 MED ORDER — FENTANYL CITRATE (PF) 100 MCG/2ML IJ SOLN
INTRAMUSCULAR | Status: DC | PRN
  Administered 2016-09-10: 25 ug via INTRAVENOUS

## 2016-09-10 MED ORDER — CEFAZOLIN SODIUM-DEXTROSE 2-4 GM/100ML-% IV SOLN
2.0000 g | INTRAVENOUS | Status: AC
  Administered 2016-09-10: 2 g via INTRAVENOUS
  Filled 2016-09-10: qty 100

## 2016-09-10 MED ORDER — LIDOCAINE 2% (20 MG/ML) 5 ML SYRINGE
INTRAMUSCULAR | Status: DC | PRN
  Administered 2016-09-10: 100 mg via INTRAVENOUS

## 2016-09-10 MED ORDER — PROPOFOL 10 MG/ML IV BOLUS
INTRAVENOUS | Status: DC | PRN
  Administered 2016-09-10: 200 mg via INTRAVENOUS

## 2016-09-10 MED ORDER — SODIUM CHLORIDE 0.9 % IJ SOLN
INTRAMUSCULAR | Status: AC
  Filled 2016-09-10: qty 40

## 2016-09-10 MED ORDER — ONABOTULINUMTOXINA 100 UNITS IJ SOLR
100.0000 [IU] | Freq: Once | INTRAMUSCULAR | Status: AC
  Administered 2016-09-10: 300 [IU] via INTRAMUSCULAR
  Filled 2016-09-10 (×4): qty 100

## 2016-09-10 MED ORDER — LIDOCAINE 2% (20 MG/ML) 5 ML SYRINGE
INTRAMUSCULAR | Status: AC
  Filled 2016-09-10: qty 5

## 2016-09-10 MED ORDER — LACTATED RINGERS IV SOLN
INTRAVENOUS | Status: DC
  Administered 2016-09-10: 1000 mL via INTRAVENOUS

## 2016-09-10 MED ORDER — SODIUM CHLORIDE 0.9 % IJ SOLN
INTRAMUSCULAR | Status: DC | PRN
  Administered 2016-09-10: 30 mL

## 2016-09-10 MED ORDER — PROPOFOL 10 MG/ML IV BOLUS
INTRAVENOUS | Status: AC
  Filled 2016-09-10: qty 20

## 2016-09-10 MED ORDER — MIDAZOLAM HCL 2 MG/2ML IJ SOLN
INTRAMUSCULAR | Status: AC
  Filled 2016-09-10: qty 2

## 2016-09-10 MED ORDER — SODIUM CHLORIDE 0.9 % IR SOLN
Status: DC | PRN
  Administered 2016-09-10: 3000 mL via INTRAVESICAL

## 2016-09-10 MED ORDER — MIDAZOLAM HCL 5 MG/5ML IJ SOLN
INTRAMUSCULAR | Status: DC | PRN
  Administered 2016-09-10: 2 mg via INTRAVENOUS

## 2016-09-10 MED ORDER — FENTANYL CITRATE (PF) 100 MCG/2ML IJ SOLN
INTRAMUSCULAR | Status: AC
  Filled 2016-09-10: qty 2

## 2016-09-10 SURGICAL SUPPLY — 19 items
BAG URO CATCHER STRL LF (MISCELLANEOUS) ×3 IMPLANT
BASKET ZERO TIP NITINOL 2.4FR (BASKET) IMPLANT
BSKT STON RTRVL ZERO TP 2.4FR (BASKET)
CATH INTERMIT  6FR 70CM (CATHETERS) IMPLANT
CATH ROBINSON RED A/P 16FR (CATHETERS) ×3 IMPLANT
CLOTH BEACON ORANGE TIMEOUT ST (SAFETY) ×3 IMPLANT
COVER SURGICAL LIGHT HANDLE (MISCELLANEOUS) IMPLANT
GLOVE BIOGEL M STRL SZ7.5 (GLOVE) ×3 IMPLANT
GOWN STRL REUS W/TWL LRG LVL3 (GOWN DISPOSABLE) ×3 IMPLANT
GOWN STRL REUS W/TWL XL LVL3 (GOWN DISPOSABLE) ×3 IMPLANT
GUIDEWIRE ANG ZIPWIRE 038X150 (WIRE) IMPLANT
GUIDEWIRE STR DUAL SENSOR (WIRE) IMPLANT
MANIFOLD NEPTUNE II (INSTRUMENTS) ×3 IMPLANT
NDL SAFETY ECLIPSE 18X1.5 (NEEDLE) ×1 IMPLANT
NEEDLE HYPO 18GX1.5 SHARP (NEEDLE) ×3
PACK CYSTO (CUSTOM PROCEDURE TRAY) ×3 IMPLANT
SYR 10ML ECCENTRIC (SYRINGE) ×3 IMPLANT
TUBING CONNECTING 10 (TUBING) ×2 IMPLANT
TUBING CONNECTING 10' (TUBING) ×1

## 2016-09-10 NOTE — Anesthesia Preprocedure Evaluation (Addendum)
Anesthesia Evaluation  Patient identified by MRN, date of birth, ID band Patient awake    Reviewed: Allergy & Precautions, NPO status , Patient's Chart, lab work & pertinent test results  History of Anesthesia Complications Negative for: history of anesthetic complications  Airway Mallampati: II  TM Distance: >3 FB Neck ROM: Full    Dental no notable dental hx. (+) Dental Advisory Given   Pulmonary asthma ,    Pulmonary exam normal        Cardiovascular negative cardio ROS Normal cardiovascular exam     Neuro/Psych C4 quad negative psych ROS   GI/Hepatic negative GI ROS, Neg liver ROS,   Endo/Other  negative endocrine ROS  Renal/GU negative Renal ROS     Musculoskeletal   Abdominal   Peds  Hematology negative hematology ROS (+)   Anesthesia Other Findings   Reproductive/Obstetrics                            Anesthesia Physical Anesthesia Plan  ASA: III  Anesthesia Plan: General   Post-op Pain Management:    Induction: Intravenous  Airway Management Planned: LMA  Additional Equipment:   Intra-op Plan:   Post-operative Plan: Extubation in OR  Informed Consent: I have reviewed the patients History and Physical, chart, labs and discussed the procedure including the risks, benefits and alternatives for the proposed anesthesia with the patient or authorized representative who has indicated his/her understanding and acceptance.   Dental advisory given  Plan Discussed with: CRNA and Anesthesiologist  Anesthesia Plan Comments:        Anesthesia Quick Evaluation

## 2016-09-10 NOTE — Anesthesia Postprocedure Evaluation (Addendum)
Anesthesia Post Note  Patient: Ryan Watkins  Procedure(s) Performed: Procedure(s) (LRB): CYSTOSCOPY WITH BOTOX INJECTION (N/A) BOTOX INJECTION (N/A)  Patient location during evaluation: PACU Anesthesia Type: General Level of consciousness: sedated Pain management: pain level controlled Vital Signs Assessment: post-procedure vital signs reviewed and stable Respiratory status: spontaneous breathing and respiratory function stable Cardiovascular status: stable Anesthetic complications: no       Last Vitals:  Vitals:   09/10/16 0937 09/10/16 1230  BP: (!) 83/60 111/90  Pulse: 74 (!) 58  Resp: 16 14  Temp: 36.7 C 36.4 C    Last Pain:  Vitals:   09/10/16 0937  TempSrc: Oral                 Nature Kueker DANIEL

## 2016-09-10 NOTE — Transfer of Care (Signed)
Immediate Anesthesia Transfer of Care Note  Patient: Ryan AddisonLarry J Watkins  Procedure(s) Performed: Procedure(s): CYSTOSCOPY WITH BOTOX INJECTION (N/A) BOTOX INJECTION (N/A)  Patient Location: PACU  Anesthesia Type:General  Level of Consciousness:  sedated, patient cooperative and responds to stimulation  Airway & Oxygen Therapy:Patient Spontanous Breathing and Patient connected to face mask oxgen  Post-op Assessment:  Report given to PACU RN and Post -op Vital signs reviewed and stable  Post vital signs:  Reviewed and stable  Last Vitals:  Vitals:   09/10/16 0937  BP: (!) 83/60  Pulse: 74  Resp: 16  Temp: 36.7 C    Complications: No apparent anesthesia complications

## 2016-09-10 NOTE — Op Note (Signed)
Preoperative diagnosis: Neurogenic hyperreflexic bladder Postoperative diagnosis: Same  Procedure: Cystoscopy with injection of Botox into bladder  Surgeon: Mena GoesEskridge  Anesthesia: Gen.  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 He presents for repeat Botox injection.  Findings: The urethra remains quite tight especially the membranous urethra. There was evidence of some catheter trauma. The prostate appeared nonobstructing. The trigone and ureteral orifices appeared normal. There was no stone or foreign body in the bladder. The bladder had good capacity very mild trabeculation.  Description of procedure: After consent was obtained patient brought to the operating room. After adequate anesthesia he was placed in lithotomy position and prepped and draped in the usual sterile fashion. A timeout was performed to confirm the patient and procedure. 100 units of Botox was diluted with 10 mL for a final dilution of 10 units per mL. The urine was clear with just a small amount of particulate matter. The bladder was irrigated 4-5 times until clear. The bladder was refill with about 250 mL of irrigation. The bladder was injected with 1 mL aliquots in the standard grid fashion above the trigone for a total of 30 injections or 300 units. Hemostasis was excellent at low-pressure. The scope was removed. 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 The scope was removed. I did an in and out cath with a 16 French red rubber to empty the bladder. The urine was clear. The bladder was drained and the catheter removed. He was awakened and taken to the recovery room in stable condition.  Complications: None Blood loss: Minimal Specimens: None Drains: None  Disposition: Patient stable to PACU

## 2016-09-10 NOTE — Interval H&P Note (Signed)
History and Physical Interval Note:  09/10/2016 11:34 AM  Ryan Watkins  has presented today for surgery, with the diagnosis of neurogenic bladder  The various methods of treatment have been discussed with the patient and family. After consideration of risks, benefits and other options for treatment, the patient has consented to  Procedure(s): CYSTOSCOPY WITH BOTOX INJECTION (N/A) BOTOX INJECTION (N/A) as a surgical intervention .  The patient's history has been reviewed, patient examined, no change in status, stable for surgery.  I have reviewed the patient's chart and labs. He started NF Saturday. He leaks a little more in between CIC but also he gets "high blood pressure" and a sweaty feeling sooner in between CIC. His bladder doesn't "hold as much" be fore the gets these symptoms.  Questions were answered to the patient's satisfaction.     Nathalee Smarr

## 2016-09-10 NOTE — Anesthesia Procedure Notes (Addendum)
Procedure Name: LMA Insertion Date/Time: 09/10/2016 11:47 AM Performed by: Orest DikesPETERS, Callen Zuba J Pre-anesthesia Checklist: Patient identified, Emergency Drugs available, Suction available and Patient being monitored Patient Re-evaluated:Patient Re-evaluated prior to inductionOxygen Delivery Method: Circle system utilized Preoxygenation: Pre-oxygenation with 100% oxygen Intubation Type: IV induction LMA: LMA inserted LMA Size: 4.0 Number of attempts: 1 Placement Confirmation: positive ETCO2 and breath sounds checked- equal and bilateral Tube secured with: Tape Dental Injury: Teeth and Oropharynx as per pre-operative assessment

## 2016-09-10 NOTE — Discharge Instructions (Signed)
Botulinum Toxin Bladder Injection, Care After Refer to this sheet in the next few weeks. These instructions provide you with information about caring for yourself after your procedure. Your health care provider may also give you more specific instructions. Your treatment has been planned according to current medical practices, but problems sometimes occur. Call your health care provider if you have any problems or questions after your procedure. What can I expect after the procedure? After the procedure, it is common to have:  Blood-tinged urine.  Burning or soreness when you pass urine. Follow these instructions at home:   Do not drive for 24 hours if you received a sedative.  Take over-the-counter and prescription medicines only as told by your health care provider.  If you were prescribed an antibiotic medicine, take it as told by your health care provider. Do not stop taking the antibiotic even if you start to feel better.  Drink enough fluid to keep your urine clear or pale yellow.  Return to your normal activities as told by your health care provider. Ask your health care provider what activities are safe for you.  Keep all follow-up visits as told by your health care provider. This is important. Contact a health care provider if:  You have a fever or chills.  You have blood-tinged urine for more than one day after your procedure.  You have worsening pain or burning when you pass urine.  You have pain or burning when passing urine for more than two days after your procedure.  You have trouble emptying your bladder. Get help right away if:  You have chest pain.  You faint.  You have a fever.  You develop signs of an allergic reaction. These include:  An itchy rash or welts.  Wheezing or shortness of breath.  Dizziness.  You develop double or blurred vision.  Your pupils start to widen (dilate) and are sensitive to light.  Your eyelids become droopy or  swollen.  You have trouble speaking.  You feel short of breath or have trouble breathing.  Your voice changes or becomes hoarse.  You start to have trouble swallowing.  You develop muscle pain, weakness, or spasms in other parts of your body.  You have bright red blood in your urine.  You are unable to catheterize or pass urine. This information is not intended to replace advice given to you by your health care provider. Make sure you discuss any questions you have with your health care provider. Document Released: 01/11/2015 Document Revised: 11/10/2015 Document Reviewed: 10/19/2014 Elsevier Interactive Patient Education  2017 [employer] Inc[employer]    Cystoscopy, Care After Refer to this sheet in the next few weeks. These instructions provide you with information about caring for yourself after your procedure. Your health care provider may also give you more specific instructions. Your treatment has been planned according to current medical practices, but problems sometimes occur. Call your health care provider if you have any problems or questions after your procedure. What can I expect after the procedure? After the procedure, it is common to have:  Mild pain when you urinate. Pain should stop within a few minutes after you urinate. This may last for up to 1 week.  A small amount of blood in your urine for several days.  Feeling like you need to urinate but producing only a small amount of urine. Follow these instructions at home:   Medicines   Take over-the-counter and prescription medicines only as told by your health care  provider.  If you were prescribed an antibiotic medicine, take it as told by your health care provider. Do not stop taking the antibiotic even if you start to feel better. General instructions    Return to your normal activities as told by your health care provider. Ask your health care provider what activities are safe for you.  Do not drive for 24 hours  if you received a sedative.  Watch for any blood in your urine. If the amount of blood in your urine increases, call your health care provider.  Follow instructions from your health care provider about eating or drinking restrictions.  If a tissue sample was removed for testing (biopsy) during your procedure, it is your responsibility to get your test results. Ask your health care provider or the department performing the test when your results will be ready.  Drink enough fluid to keep your urine clear or pale yellow.  Keep all follow-up visits as told by your health care provider. This is important. Contact a health care provider if:  You have pain that gets worse or does not get better with medicine, especially pain when you urinate.  You have difficulty urinating. Get help right away if:  You have more blood in your urine.  You have blood clots in your urine.  You have abdominal pain.  You have a fever or chills.  You are unable to urinate. This information is not intended to replace advice given to you by your health care provider. Make sure you discuss any questions you have with your health care provider. Document Released: 11/09/2004 Document Revised: 09/28/2015 Document Reviewed: 03/09/2015 Elsevier Interactive Patient Education  2017 [employer]

## 2016-09-11 ENCOUNTER — Encounter (HOSPITAL_COMMUNITY): Payer: Self-pay | Admitting: Urology

## 2016-10-05 NOTE — Addendum Note (Signed)
Addendum  created 10/05/16 0930 by Anthany Knipp, MD   Sign clinical note    

## 2017-05-27 ENCOUNTER — Other Ambulatory Visit: Payer: Self-pay | Admitting: Urology

## 2017-05-28 MED ORDER — ONABOTULINUMTOXINA 100 UNITS IJ SOLR: 100.0000 [IU] | Freq: Once | INTRAMUSCULAR | Status: AC

## 2017-05-30 ENCOUNTER — Encounter (HOSPITAL_COMMUNITY): Payer: Self-pay | Admitting: *Deleted

## 2017-05-30 ENCOUNTER — Other Ambulatory Visit: Payer: Self-pay

## 2017-05-30 NOTE — Progress Notes (Signed)
Patient has small area about the size of a dime on left heel from blood blister

## 2017-06-09 MED ORDER — DEXTROSE 5 % IV SOLN
2.0000 g | INTRAVENOUS | Status: DC
  Filled 2017-06-09: qty 2

## 2017-06-10 ENCOUNTER — Encounter (HOSPITAL_COMMUNITY)
Admission: RE | Disposition: A | Discharge status: Home / Self Care | Payer: Self-pay | Source: Ambulatory Visit | Attending: Urology

## 2017-06-10 ENCOUNTER — Ambulatory Visit (HOSPITAL_COMMUNITY)
Admission: RE | Admit: 2017-06-10 | Discharge: 2017-06-10 | Disposition: A | Discharge status: Home / Self Care | Payer: Medicare Other | Source: Ambulatory Visit | Attending: Urology | Admitting: Urology

## 2017-06-10 ENCOUNTER — Ambulatory Visit (HOSPITAL_COMMUNITY): Payer: Medicare Other

## 2017-06-10 ENCOUNTER — Encounter (HOSPITAL_COMMUNITY): Payer: Self-pay | Admitting: Anesthesiology

## 2017-06-10 ENCOUNTER — Ambulatory Visit (HOSPITAL_COMMUNITY): Payer: Medicare Other | Admitting: Anesthesiology

## 2017-06-10 ENCOUNTER — Other Ambulatory Visit: Payer: Self-pay

## 2017-06-10 DIAGNOSIS — Z885 Allergy status to narcotic agent status: Secondary | ICD-10-CM | POA: Insufficient documentation

## 2017-06-10 DIAGNOSIS — Z79899 Other long term (current) drug therapy: Secondary | ICD-10-CM | POA: Insufficient documentation

## 2017-06-10 DIAGNOSIS — J45909 Unspecified asthma, uncomplicated: Secondary | ICD-10-CM | POA: Insufficient documentation

## 2017-06-10 DIAGNOSIS — G8250 Quadriplegia, unspecified: Secondary | ICD-10-CM | POA: Insufficient documentation

## 2017-06-10 DIAGNOSIS — N135 Crossing vessel and stricture of ureter without hydronephrosis: Secondary | ICD-10-CM | POA: Diagnosis not present

## 2017-06-10 DIAGNOSIS — N200 Calculus of kidney: Secondary | ICD-10-CM | POA: Diagnosis not present

## 2017-06-10 DIAGNOSIS — N319 Neuromuscular dysfunction of bladder, unspecified: Secondary | ICD-10-CM | POA: Diagnosis present

## 2017-06-10 HISTORY — PX: CYSTOSCOPY WITH INJECTION: SHX1424

## 2017-06-10 HISTORY — PX: CYSTOSCOPY/URETEROSCOPY/HOLMIUM LASER/STENT PLACEMENT: SHX6546

## 2017-06-10 LAB — CBC
HEMATOCRIT: 39.1 % (ref 39.0–52.0)
Hemoglobin: 13.6 g/dL (ref 13.0–17.0)
MCH: 29.1 pg (ref 26.0–34.0)
MCHC: 34.8 g/dL (ref 30.0–36.0)
MCV: 83.5 fL (ref 78.0–100.0)
PLATELETS: 188 10*3/uL (ref 150–400)
RBC: 4.68 MIL/uL (ref 4.22–5.81)
RDW: 13.5 % (ref 11.5–15.5)
WBC: 8.3 10*3/uL (ref 4.0–10.5)

## 2017-06-10 SURGERY — CYSTOSCOPY, WITH INJECTION OF BLADDER NECK OR BLADDER WALL
Anesthesia: General | Laterality: Right

## 2017-06-10 MED ORDER — SODIUM CHLORIDE 0.9 % IR SOLN
Status: DC | PRN
  Administered 2017-06-10: 6000 mL

## 2017-06-10 MED ORDER — ARTIFICIAL TEARS OPHTHALMIC OINT
TOPICAL_OINTMENT | OPHTHALMIC | Status: DC | PRN
  Administered 2017-06-10: 1 via OPHTHALMIC

## 2017-06-10 MED ORDER — GLYCOPYRROLATE 0.2 MG/ML IV SOSY
PREFILLED_SYRINGE | INTRAVENOUS | Status: AC
  Filled 2017-06-10: qty 5

## 2017-06-10 MED ORDER — ONDANSETRON HCL 4 MG/2ML IJ SOLN
INTRAMUSCULAR | Status: DC | PRN
  Administered 2017-06-10: 4 mg via INTRAVENOUS

## 2017-06-10 MED ORDER — SODIUM CHLORIDE 0.9 % IJ SOLN
INTRAMUSCULAR | Status: AC
  Filled 2017-06-10: qty 20

## 2017-06-10 MED ORDER — DEXAMETHASONE SODIUM PHOSPHATE 10 MG/ML IJ SOLN
INTRAMUSCULAR | Status: DC | PRN
  Administered 2017-06-10: 10 mg via INTRAVENOUS

## 2017-06-10 MED ORDER — PROPOFOL 10 MG/ML IV BOLUS
INTRAVENOUS | Status: AC
  Filled 2017-06-10: qty 40

## 2017-06-10 MED ORDER — GLYCOPYRROLATE 0.2 MG/ML IJ SOLN
INTRAMUSCULAR | Status: DC | PRN
  Administered 2017-06-10: .3 mg via INTRAVENOUS

## 2017-06-10 MED ORDER — MIDAZOLAM HCL 5 MG/5ML IJ SOLN
INTRAMUSCULAR | Status: DC | PRN
  Administered 2017-06-10: 2 mg via INTRAVENOUS

## 2017-06-10 MED ORDER — FENTANYL CITRATE (PF) 100 MCG/2ML IJ SOLN
INTRAMUSCULAR | Status: DC | PRN
  Administered 2017-06-10: 50 ug via INTRAVENOUS
  Administered 2017-06-10 (×2): 25 ug via INTRAVENOUS

## 2017-06-10 MED ORDER — SODIUM CHLORIDE 0.9 % IJ SOLN
INTRAMUSCULAR | Status: AC
  Filled 2017-06-10: qty 10

## 2017-06-10 MED ORDER — PROPOFOL 10 MG/ML IV BOLUS
INTRAVENOUS | Status: DC | PRN
  Administered 2017-06-10: 200 mg via INTRAVENOUS

## 2017-06-10 MED ORDER — PROMETHAZINE HCL 25 MG/ML IJ SOLN: 6.2500 mg | INTRAMUSCULAR | Status: DC | PRN

## 2017-06-10 MED ORDER — FENTANYL CITRATE (PF) 100 MCG/2ML IJ SOLN: 25.0000 ug | INTRAMUSCULAR | Status: DC | PRN

## 2017-06-10 MED ORDER — NITROFURANTOIN MACROCRYSTAL 100 MG PO CAPS: 100.0000 mg | ORAL_CAPSULE | Freq: Every day | ORAL | 0 refills | Status: DC

## 2017-06-10 MED ORDER — VANCOMYCIN HCL IN DEXTROSE 1-5 GM/200ML-% IV SOLN
1000.0000 mg | INTRAVENOUS | Status: AC
  Administered 2017-06-10: 1000 mg via INTRAVENOUS
  Filled 2017-06-10: qty 200

## 2017-06-10 MED ORDER — MIDAZOLAM HCL 2 MG/2ML IJ SOLN
INTRAMUSCULAR | Status: AC
  Filled 2017-06-10: qty 2

## 2017-06-10 MED ORDER — LIDOCAINE HCL (CARDIAC) 20 MG/ML IV SOLN
INTRAVENOUS | Status: DC | PRN
  Administered 2017-06-10: 100 mg via INTRAVENOUS

## 2017-06-10 MED ORDER — ONABOTULINUMTOXINA 100 UNITS IJ SOLR
200.0000 [IU] | Freq: Once | INTRAMUSCULAR | Status: AC
  Administered 2017-06-10: 200 [IU] via INTRAMUSCULAR
  Filled 2017-06-10: qty 200

## 2017-06-10 MED ORDER — GENTAMICIN SULFATE 40 MG/ML IJ SOLN
350.0000 mg | INTRAVENOUS | Status: AC
  Administered 2017-06-10: 350 mg via INTRAVENOUS
  Filled 2017-06-10: qty 8.75

## 2017-06-10 MED ORDER — IOHEXOL 300 MG/ML  SOLN
INTRAMUSCULAR | Status: DC | PRN
  Administered 2017-06-10: 3 mL

## 2017-06-10 MED ORDER — DEXAMETHASONE SODIUM PHOSPHATE 10 MG/ML IJ SOLN
INTRAMUSCULAR | Status: AC
  Filled 2017-06-10: qty 3

## 2017-06-10 MED ORDER — ONDANSETRON HCL 4 MG/2ML IJ SOLN
INTRAMUSCULAR | Status: AC
  Filled 2017-06-10: qty 6

## 2017-06-10 MED ORDER — LIDOCAINE 2% (20 MG/ML) 5 ML SYRINGE
INTRAMUSCULAR | Status: AC
  Filled 2017-06-10: qty 15

## 2017-06-10 MED ORDER — FENTANYL CITRATE (PF) 100 MCG/2ML IJ SOLN
INTRAMUSCULAR | Status: AC
  Filled 2017-06-10: qty 2

## 2017-06-10 MED ORDER — LACTATED RINGERS IV SOLN
INTRAVENOUS | Status: DC | PRN
  Administered 2017-06-10: 07:00:00 via INTRAVENOUS

## 2017-06-10 SURGICAL SUPPLY — 21 items
BAG URO CATCHER STRL LF (MISCELLANEOUS) ×4 IMPLANT
CLOTH BEACON ORANGE TIMEOUT ST (SAFETY) ×4 IMPLANT
COVER FOOTSWITCH UNIV (MISCELLANEOUS) IMPLANT
COVER SURGICAL LIGHT HANDLE (MISCELLANEOUS) ×4 IMPLANT
DEFLUX NEEDLE (NEEDLE) IMPLANT
DEFLUX SYRINGE (SYRINGE) IMPLANT
GLOVE BIOGEL M STRL SZ7.5 (GLOVE) ×4 IMPLANT
GOWN STRL REUS W/TWL LRG LVL3 (GOWN DISPOSABLE) ×4 IMPLANT
GOWN STRL REUS W/TWL XL LVL3 (GOWN DISPOSABLE) ×4 IMPLANT
GUIDEWIRE ANG ZIPWIRE 038X150 (WIRE) ×4 IMPLANT
MANIFOLD NEPTUNE II (INSTRUMENTS) ×4 IMPLANT
NDL SAFETY ECLIPSE 18X1.5 (NEEDLE) IMPLANT
NEEDLE HYPO 18GX1.5 SHARP (NEEDLE)
PACK CYSTO (CUSTOM PROCEDURE TRAY) ×4 IMPLANT
SHEATH URETERAL 12FRX35CM (MISCELLANEOUS) ×4 IMPLANT
STENT CONTOUR 6FRX26X.038 (STENTS) ×4 IMPLANT
SYR CONTROL 10ML LL (SYRINGE) IMPLANT
TUBING CONNECTING 10 (TUBING) IMPLANT
TUBING CONNECTING 10' (TUBING)
TUBING UROLOGY SET (TUBING) ×4 IMPLANT
WATER STERILE IRR 3000ML UROMA (IV SOLUTION) IMPLANT

## 2017-06-10 NOTE — H&P (Signed)
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  He was brought today for repeat Botox.  Last Botox injection was May 2018 and he has had recurrent dysreflexia symptoms in between CIC.  He also has an enlarging now 13 mm.  He has been well without fever or gross hematuria.  Urine is clear.  He has been on nitrofurantoin the past 5 days.  Urine culture in the office grew enterococcus and we are also planning to cover him for Pseudomonas on previous culture.  Past Medical History:  Diagnosis Date  . Asthma   . Neurogenic bladder    caths 4 x day  . Pneumonia 2009  . Quadriplegia (HCC) 08-2003   C 4  . Sinus infection few weeks ago, off all antibiotics   lungs clear no cough  . UTI (urinary tract infection)    completed Cipro 07/18/11   Past Surgical History:  Procedure Laterality Date  . bladder surgery to remove kidney stones  2006  . BOTOX INJECTION N/A 06/29/2013   Procedure: BOTOX INJECTION;  Surgeon: [name], MD;  Location: [facility];  Service: Urology;  Laterality: N/A;  . BOTOX INJECTION N/A 09/10/2016   Procedure: BOTOX INJECTION;  Surgeon: [name], MD;  Location: [facility];  Service: Urology;  Laterality: N/A;  . cervical spinal surgery C 4, C5 and C6  08-2003  . CYSTOSCOPY  02/18/2012   Procedure: CYSTOSCOPY;  Surgeon: [name], MD;  Location: [facility];  Service: Urology;  Laterality: N/A;  . CYSTOSCOPY N/A 08/07/2012   Procedure: CYSTOSCOPY WITH BOTOX INJECTION ;  Surgeon: [name], MD;  Location: [facility];  Service: Urology;  Laterality: N/A;  . CYSTOSCOPY N/A 09/10/2016   Procedure: CYSTOSCOPY WITH BOTOX INJECTION;  Surgeon: [name], MD;  Location: [facility];  Service: Urology;  Laterality: N/A;  . CYSTOSCOPY WITH INJECTION  07/23/2011   Procedure: CYSTOSCOPY WITH INJECTION;  Surgeon: [name], MD;   Location: [facility];  Service: Urology;  Laterality: N/A;  . CYSTOSCOPY WITH INJECTION N/A 03/29/2014   Procedure: CYSTOSCOPY WITH BOTOX INJECTION;  Surgeon: [name], MD;  Location: [facility];  Service: Urology;  Laterality: N/A;  . CYSTOSCOPY WITH INJECTION N/A 12/13/2014   Procedure: CYSTOSCOPY WITH BOTOX;  Surgeon: [name], MD;  Location: [facility];  Service: Urology;  Laterality: N/A;  . CYSTOSCOPY WITH INJECTION N/A 06/16/2015   Procedure: CYSTOSCOPY WITH BOTOX INJECTION;  Surgeon: [name], MD;  Location: [facility];  Service: Urology;  Laterality: N/A;  . CYSTOSCOPY WITH RETROGRADE PYELOGRAM, URETEROSCOPY AND STENT PLACEMENT Right 07/13/2013   Procedure: CYSTOSCOPY RIGHT URETEROSCOPY/LASER LITHROSCOPY [name] BASKET EXTRACTION STENT PLACEMENT;  Surgeon: [name], MD;  Location: [facility];  Service: Urology;  Laterality: Right;  . CYSTOSCOPY WITH URETEROSCOPY AND STENT PLACEMENT Right 06/29/2013   Procedure: CYSTOSCOPY WITH RETROGRADE, URETEROSCOPY AND STENT PLACEMENT ON RIGHT;  Surgeon: [name], MD;  Location: [facility];  Service: Urology;  Laterality: Right;  . duodenal surgery  feb 2005  . HOLMIUM LASER APPLICATION Right 07/13/2013   Procedure: RIGHT LASER LITHOTRIPSY;  Surgeon: [name], MD;  Location: [facility];  Service: Urology;  Laterality: Right;  . sacral skin flap surgery  june 2005   on sacrum  . 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 vena cana filter placement  [id number]  right groin  . WISDOM TOOTH EXTRACTION  2009    Home Medications:  Medications Prior to Admission  Medication Sig Dispense Refill Last Dose  . fluticasone (FLONASE) 50 MCG/ACT nasal spray Place 1 spray into both nostrils daily.    06/09/2017 at Unknown time  . fluticasone furoate-vilanterol (BREO ELLIPTA) 100-25 MCG/INH AEPB Inhale 1 puff into the lungs daily.   06/09/2017 at Unknown time  . ibuprofen (ADVIL,MOTRIN) 200 MG tablet Take  400 mg by mouth every 6 (six) hours as needed for fever, headache or moderate pain.    Past Month at Unknown time  . nitrofurantoin (MACRODANTIN) 100 MG capsule Take 100 mg by mouth 2 (two) times daily.   06/10/2017 at 0500  . polyethylene glycol (MIRALAX / GLYCOLAX) packet Take 17 g by mouth daily as needed for mild constipation.   Past Week at Unknown time   Allergies:  Allergies  Allergen Reactions  . Morphine And Related Other (See Comments)    hallucinations    History reviewed. No pertinent family history. Social History:  reports that  has never smoked. he has never used smokeless tobacco. He reports that he does not drink alcohol or use drugs.  ROS: A complete review of systems was performed.  All systems are negative except for pertinent findings as noted. Review of Systems  All other systems reviewed and are negative.    Physical Exam:  Vital signs in last 24 hours: Temp:  [98.1 F (36.7 C)] 98.1 F (36.7 C) (02/05 0611) Pulse Rate:  [59] 59 (02/05 0611) Resp:  [16] 16 (02/05 0611) BP: (122)/(93) 122/93 (02/05 0611) SpO2:  [96 %] 96 % (02/05 0611) Weight:  [69.9 kg (154 lb)] 69.9 kg (154 lb) (02/05 [id number]) General:  Alert and oriented, No acute distress HEENT: Normocephalic, atraumatic Cardiovascular: Regular rate and rhythm Lungs: Regular rate and effort Abdomen: Soft, nontender, nondistended, no abdominal masses Back: No CVA tenderness Extremities: No edema Neurologic: Cranial nerves are grossly intact, no movement of the extremities or body.  Laboratory Data:  Results for orders placed or performed during the hospital encounter of 06/10/17 (from the past 24 hour(s))  CBC     Status: None   Collection Time: 06/10/17  6:25 AM  Result Value Ref Range   WBC 8.3 4.0 - 10.5 K/uL   RBC 4.68 4.22 - 5.81 MIL/uL   Hemoglobin 13.6 13.0 - 17.0 g/dL   HCT 21.339.1 08.639.0 - 57.852.0 %   MCV 83.5 78.0 - 100.0 fL   MCH 29.1 26.0 - 34.0 pg   MCHC 34.8 30.0 - 36.0 g/dL   RDW 46.913.5  62.911.5 - 52.815.5 %   Platelets 188 150 - 400 K/uL   No results found for this or any previous visit (from the past 240 hour(s)). Creatinine: No results for input(s): CREATININE in the last 168 hours.  Impression/Assessment/Plan:  I discussed with the patient and his wife the nature, potential benefits, risks and alternatives to cystoscopy with botox injection into the bladder, right retrograde pyelogram, ureteroscopy, laser lithotripsy and stone basket extraction, including side effects of the proposed treatment, the likelihood of the patient achieving the goals of the procedure, and any potential problems that might occur during the procedure or recuperation. We discussed his ureter was tight in 2015 and he required a staged procedure with pre-stent. He very well may need a staged procedure at this time. All questions answered. Patient elects to proceed.    Jerilee FieldMatthew Ioana Louks 06/10/2017, 7:22 AM

## 2017-06-10 NOTE — Transfer of Care (Signed)
Immediate Anesthesia Transfer of Care Note  Patient: Ryan Watkins  Procedure(s) Performed: CYSTOSCOPY WITH INJECTION/ BOTOX (N/A ) CYSTOSCOPY/RETROGRADE/URETEROSCOPY/STENT PLACEMENT (Right )  Patient Location: PACU  Anesthesia Type:General  Level of Consciousness: awake, alert  and oriented  Airway & Oxygen Therapy: Patient Spontanous Breathing and Patient connected to face mask oxygen  Post-op Assessment: Report given to RN  Post vital signs: Reviewed and stable  Last Vitals:  Vitals:   06/10/17 0611  BP: (!) 122/93  Pulse: (!) 59  Resp: 16  Temp: 36.7 C  SpO2: 96%    Last Pain:  Vitals:   06/10/17 0611  TempSrc: Oral      Patients Stated Pain Goal: 4 (06/10/17 0640)  Complications: No apparent anesthesia complications

## 2017-06-10 NOTE — Discharge Instructions (Signed)
Botulinum Toxin Bladder Injection, Care After Refer to this sheet in the next few weeks. These instructions provide you with information about caring for yourself after your procedure. Your health care provider may also give you more specific instructions. Your treatment has been planned according to current medical practices, but problems sometimes occur. Call your health care provider if you have any problems or questions after your procedure. What can I expect after the procedure? After the procedure, it is common to have:  Blood-tinged urine.  Burning or soreness when you pass urine.  Follow these instructions at home:   Do not drive for 24 hours if you received a sedative.  Take over-the-counter and prescription medicines only as told by your health care provider.  If you were prescribed an antibiotic medicine, take it as told by your health care provider. Do not stop taking the antibiotic even if you start to feel better.  Drink enough fluid to keep your urine clear or pale yellow.  Return to your normal activities as told by your health care provider. Ask your health care provider what activities are safe for you.  Keep all follow-up visits as told by your health care provider. This is important. Contact a health care provider if:  You have a fever or chills.  You have blood-tinged urine for more than one day after your procedure.  You have worsening pain or burning when you pass urine.  You have pain or burning when passing urine for more than two days after your procedure.  You have trouble emptying your bladder. Get help right away if:  You have bright red blood in your urine.  You are unable to pass urine. This information is not intended to replace advice given to you by your health care provider. Make sure you discuss any questions you have with your health care provider. Document Released: 01/11/2015 Document Revised: 11/10/2015 Document Reviewed:  10/19/2014 Elsevier Interactive Patient Education  2018 Elsevier Inc.   Ureteral Stent Implantation, Care After Refer to this sheet in the next few weeks. These instructions provide you with information about caring for yourself after your procedure. Your health care provider may also give you more specific instructions. Your treatment has been planned according to current medical practices, but problems sometimes occur. Call your health care provider if you have any problems or questions after your procedure. What can I expect after the procedure? After the procedure, it is common to have:  Nausea.  Mild pain when you urinate. You may feel this pain in your lower back or lower abdomen. Pain should stop within a few minutes after you urinate. This may last for up to 1 week.  A small amount of blood in your urine for several days.  Follow these instructions at home:  Medicines  Take over-the-counter and prescription medicines only as told by your health care provider.  If you were prescribed an antibiotic medicine, take it as told by your health care provider. Do not stop taking the antibiotic even if you start to feel better.  Do not drive for 24 hours if you received a sedative.  Do not drive or operate heavy machinery while taking prescription pain medicines. Activity  Return to your normal activities as told by your health care provider. Ask your health care provider what activities are safe for you.  Do not lift anything that is heavier than 10 lb (4.5 kg). Follow this limit for 1 week after your procedure, or for as long as  told by your health care provider. General instructions  Watch for any blood in your urine. Call your health care provider if the amount of blood in your urine increases.  If you have a catheter: ? Follow instructions from your health care provider about taking care of your catheter and collection bag. ? Do not take baths, swim, or use a hot tub until  your health care provider approves.  Drink enough fluid to keep your urine clear or pale yellow.  Keep all follow-up visits as told by your health care provider. This is important. Contact a health care provider if:  You have pain that gets worse or does not get better with medicine, especially pain when you urinate.  You have difficulty urinating.  You feel nauseous or you vomit repeatedly during a period of more than 2 days after the procedure. Get help right away if:  Your urine is dark red or has blood clots in it.  You are leaking urine (have incontinence).  The end of the stent comes out of your urethra.  You cannot urinate.  You have sudden, sharp, or severe pain in your abdomen or lower back.  You have a fever. This information is not intended to replace advice given to you by your health care provider. Make sure you discuss any questions you have with your health care provider. Document Released: 12/23/2012 Document Revised: 09/28/2015 Document Reviewed: 11/04/2014 Elsevier Interactive Patient Education  [employer]2018 [employer]

## 2017-06-10 NOTE — Anesthesia Postprocedure Evaluation (Signed)
Anesthesia Post Note  Patient: Ryan AddisonLarry J Watkins  Procedure(s) Performed: CYSTOSCOPY WITH INJECTION/ BOTOX (N/A ) CYSTOSCOPY/RETROGRADE/URETEROSCOPY/STENT PLACEMENT (Right )     Patient location during evaluation: PACU Anesthesia Type: General Level of consciousness: awake and alert Pain management: pain level controlled Vital Signs Assessment: post-procedure vital signs reviewed and stable Respiratory status: spontaneous breathing, nonlabored ventilation and respiratory function stable Cardiovascular status: blood pressure returned to baseline and stable Postop Assessment: no apparent nausea or vomiting Anesthetic complications: no    Last Vitals:  Vitals:   06/10/17 0945 06/10/17 1006  BP: (!) 116/93 (!) 132/97  Pulse: 62 79  Resp: 16 16  Temp: (!) 36.3 C 36.6 C  SpO2: 95% 99%    Last Pain:  Vitals:   06/10/17 0945  TempSrc:   PainSc: 0-No pain                 Cecile HearingStephen Edward Winn Muehl

## 2017-06-10 NOTE — Anesthesia Procedure Notes (Signed)
Procedure Name: LMA Insertion Date/Time: 06/10/2017 7:47 AM Performed by: Theodosia QuayKey, Jenilee Franey, CRNA Pre-anesthesia Checklist: Patient identified, Emergency Drugs available, Suction available, Patient being monitored and Timeout performed Patient Re-evaluated:Patient Re-evaluated prior to induction Oxygen Delivery Method: Circle system utilized Preoxygenation: Pre-oxygenation with 100% oxygen Induction Type: IV induction Ventilation: Mask ventilation without difficulty LMA: LMA inserted Tube size: 4.0 mm Number of attempts: 1 Placement Confirmation: positive ETCO2 and breath sounds checked- equal and bilateral Tube secured with: Tape Dental Injury: Teeth and Oropharynx as per pre-operative assessment

## 2017-06-10 NOTE — Op Note (Signed)
Preoperative diagnosis: Neurogenic bladder, right renal stone Postoperative diagnosis: Same  Procedure: Cystoscopy with Botox injection of the bladder, right retrograde pyelogram, right ureteroscopy, right ureteral stent placement  Surgeon: Mena GoesEskridge  Anesthesia: General  Indication for procedure: 32 year old white male with neurogenic bladder who developed recurrent dysreflexia like symptoms in between CIC was brought today for Botox.  He also has an enlarging right renal stone in the right renal pelvis which will likely not pass and could cause significant obstruction.  Findings: The urethra and bladder were unremarkable.  No stone or foreign body in the bladder.  On scout imaging the stone was noted in the right renal pelvis.  IVC filter noted.  Right retrograde pyelogram- this outlined a single ureter single collecting system unit with a filling defect in the renal pelvis consistent with the stone.  No other filling defects.  No dilation.  The ureter narrowed after it went over the iliacs and again for the last centimeter or two at the UPJ.  There were no obstructive, but anatomic narrowing.  Right ureteroscopy-the narrowing of the proximal ureter just proximal to the iliacs was too narrow for the ureteroscope was.  The mucosa began to shear and bunch, therefore a stent was placed.  Description of procedure: After consent was obtained the patient brought to the operating room.  After adequate anesthesia he was placed in lithotomy position and prepped and draped in the usual sterile fashion.  A timeout was performed to confirm the patient and procedure.  The cystoscope was passed per urethra and the bladder inspected.  There was a mild amount of debris in the bladder although the urine was  not cloudy.  The bladder was copiously irrigated 3 or 4 times.  Next, 200 units of Botox was injected in 20 sites at a concentration of 10 units/cc.  A grid pattern of 4-5 injection sites per row was utilized  starting above the trigone.  I then placed a 6 [place]French open-ended catheter into the right ureteral orifice and retrograde injection of contrast was injected.  A sensor wire was advanced and then an access sheath.  The access sheath just made it to the iliacs.  I used the access sheath to get 2 wires and went adjacent to a Glidewire leaving this as the safety.   told channel digital ureteroscope was advanced but would not clear the ureter proximal to the iliac vessels as the ureter began to bunch around the scope.  I switched out to the single-channel digital and even this was tight and the mucosa superficially began to shear and slide with the scope, therefore plan for a stent was made.  The ureteroscope was backed out with the access sheath and the ureter noted to be normal without injury.  The Glidewire was backloaded on the cystoscope and a 6 x 26 cm stent was advanced.  The wire was removed with a good coil seen in the upper pole collecting system and a good coil in the bladder.  The bladder was drained and the scope removed.  The patient was awakened and taken to the recovery room in stable condition.  Complications: None Blood loss: Minimal Specimens: None  Drains: 6 x 26 cm right ureteral stent

## 2017-06-10 NOTE — H&P (View-Only) (Signed)
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  He was brought today for repeat Botox.  Last Botox injection was May 2018 and he has had recurrent dysreflexia symptoms in between CIC.  He also has an enlarging now 13 mm.  He has been well without fever or gross hematuria.  Urine is clear.  He has been on nitrofurantoin the past 5 days.  Urine culture in the office grew enterococcus and we are also planning to cover him for Pseudomonas on previous culture.  Past Medical History:  Diagnosis Date  . Asthma   . Neurogenic bladder    caths 4 x day  . Pneumonia 2009  . Quadriplegia (HCC) 08-2003   C 4  . Sinus infection few weeks ago, off all antibiotics   lungs clear no cough  . UTI (urinary tract infection)    completed Cipro 07/18/11   Past Surgical History:  Procedure Laterality Date  . bladder surgery to remove kidney stones  2006  . BOTOX INJECTION N/A 06/29/2013   Procedure: BOTOX INJECTION;  Surgeon: [name], MD;  Location: [facility];  Service: Urology;  Laterality: N/A;  . BOTOX INJECTION N/A 09/10/2016   Procedure: BOTOX INJECTION;  Surgeon: [name], MD;  Location: [facility];  Service: Urology;  Laterality: N/A;  . cervical spinal surgery C 4, C5 and C6  08-2003  . CYSTOSCOPY  02/18/2012   Procedure: CYSTOSCOPY;  Surgeon: [name], MD;  Location: [facility];  Service: Urology;  Laterality: N/A;  . CYSTOSCOPY N/A 08/07/2012   Procedure: CYSTOSCOPY WITH BOTOX INJECTION ;  Surgeon: [name], MD;  Location: [facility];  Service: Urology;  Laterality: N/A;  . CYSTOSCOPY N/A 09/10/2016   Procedure: CYSTOSCOPY WITH BOTOX INJECTION;  Surgeon: [name], MD;  Location: [facility];  Service: Urology;  Laterality: N/A;  . CYSTOSCOPY WITH INJECTION  07/23/2011   Procedure: CYSTOSCOPY WITH INJECTION;  Surgeon: [name], MD;   Location: [facility];  Service: Urology;  Laterality: N/A;  . CYSTOSCOPY WITH INJECTION N/A 03/29/2014   Procedure: CYSTOSCOPY WITH BOTOX INJECTION;  Surgeon: [name], MD;  Location: [facility];  Service: Urology;  Laterality: N/A;  . CYSTOSCOPY WITH INJECTION N/A 12/13/2014   Procedure: CYSTOSCOPY WITH BOTOX;  Surgeon: [name], MD;  Location: [facility];  Service: Urology;  Laterality: N/A;  . CYSTOSCOPY WITH INJECTION N/A 06/16/2015   Procedure: CYSTOSCOPY WITH BOTOX INJECTION;  Surgeon: [name], MD;  Location: [facility];  Service: Urology;  Laterality: N/A;  . CYSTOSCOPY WITH RETROGRADE PYELOGRAM, URETEROSCOPY AND STENT PLACEMENT Right 07/13/2013   Procedure: CYSTOSCOPY RIGHT URETEROSCOPY/LASER LITHROSCOPY [name] BASKET EXTRACTION STENT PLACEMENT;  Surgeon: [name], MD;  Location: [facility];  Service: Urology;  Laterality: Right;  . CYSTOSCOPY WITH URETEROSCOPY AND STENT PLACEMENT Right 06/29/2013   Procedure: CYSTOSCOPY WITH RETROGRADE, URETEROSCOPY AND STENT PLACEMENT ON RIGHT;  Surgeon: [name], MD;  Location: [facility];  Service: Urology;  Laterality: Right;  . duodenal surgery  feb 2005  . HOLMIUM LASER APPLICATION Right 07/13/2013   Procedure: RIGHT LASER LITHOTRIPSY;  Surgeon: [name], MD;  Location: [facility];  Service: Urology;  Laterality: Right;  . sacral skin flap surgery  june 2005   on sacrum  . 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 vena cana filter placement  [id number]  right groin  . WISDOM TOOTH EXTRACTION  2009    Home Medications:  Medications Prior to Admission  Medication Sig Dispense Refill Last Dose  . fluticasone (FLONASE) 50 MCG/ACT nasal spray Place 1 spray into both nostrils daily.    06/09/2017 at Unknown time  . fluticasone furoate-vilanterol (BREO ELLIPTA) 100-25 MCG/INH AEPB Inhale 1 puff into the lungs daily.   06/09/2017 at Unknown time  . ibuprofen (ADVIL,MOTRIN) 200 MG tablet Take  400 mg by mouth every 6 (six) hours as needed for fever, headache or moderate pain.    Past Month at Unknown time  . nitrofurantoin (MACRODANTIN) 100 MG capsule Take 100 mg by mouth 2 (two) times daily.   06/10/2017 at 0500  . polyethylene glycol (MIRALAX / GLYCOLAX) packet Take 17 g by mouth daily as needed for mild constipation.   Past Week at Unknown time   Allergies:  Allergies  Allergen Reactions  . Morphine And Related Other (See Comments)    hallucinations    History reviewed. No pertinent family history. Social History:  reports that  has never smoked. he has never used smokeless tobacco. He reports that he does not drink alcohol or use drugs.  ROS: A complete review of systems was performed.  All systems are negative except for pertinent findings as noted. Review of Systems  All other systems reviewed and are negative.    Physical Exam:  Vital signs in last 24 hours: Temp:  [98.1 F (36.7 C)] 98.1 F (36.7 C) (02/05 0611) Pulse Rate:  [59] 59 (02/05 0611) Resp:  [16] 16 (02/05 0611) BP: (122)/(93) 122/93 (02/05 0611) SpO2:  [96 %] 96 % (02/05 0611) Weight:  [69.9 kg (154 lb)] 69.9 kg (154 lb) (02/05 [id number]) General:  Alert and oriented, No acute distress HEENT: Normocephalic, atraumatic Cardiovascular: Regular rate and rhythm Lungs: Regular rate and effort Abdomen: Soft, nontender, nondistended, no abdominal masses Back: No CVA tenderness Extremities: No edema Neurologic: Cranial nerves are grossly intact, no movement of the extremities or body.  Laboratory Data:  Results for orders placed or performed during the hospital encounter of 06/10/17 (from the past 24 hour(s))  CBC     Status: None   Collection Time: 06/10/17  6:25 AM  Result Value Ref Range   WBC 8.3 4.0 - 10.5 K/uL   RBC 4.68 4.22 - 5.81 MIL/uL   Hemoglobin 13.6 13.0 - 17.0 g/dL   HCT 21.339.1 08.639.0 - 57.852.0 %   MCV 83.5 78.0 - 100.0 fL   MCH 29.1 26.0 - 34.0 pg   MCHC 34.8 30.0 - 36.0 g/dL   RDW 46.913.5  62.911.5 - 52.815.5 %   Platelets 188 150 - 400 K/uL   No results found for this or any previous visit (from the past 240 hour(s)). Creatinine: No results for input(s): CREATININE in the last 168 hours.  Impression/Assessment/Plan:  I discussed with the patient and his wife the nature, potential benefits, risks and alternatives to cystoscopy with botox injection into the bladder, right retrograde pyelogram, ureteroscopy, laser lithotripsy and stone basket extraction, including side effects of the proposed treatment, the likelihood of the patient achieving the goals of the procedure, and any potential problems that might occur during the procedure or recuperation. We discussed his ureter was tight in 2015 and he required a staged procedure with pre-stent. He very well may need a staged procedure at this time. All questions answered. Patient elects to proceed.    Jerilee FieldMatthew Linas Stepter 06/10/2017, 7:22 AM

## 2017-06-10 NOTE — Anesthesia Preprocedure Evaluation (Signed)
Anesthesia Evaluation  Patient identified by MRN, date of birth, ID band Patient awake    Reviewed: Allergy & Precautions, NPO status , Patient's Chart, lab work & pertinent test results  Airway Mallampati: II  TM Distance: >3 FB Neck ROM: Full    Dental  (+) Teeth Intact, Dental Advisory Given   Pulmonary asthma ,    Pulmonary exam normal breath sounds clear to auscultation       Cardiovascular Exercise Tolerance: Good negative cardio ROS Normal cardiovascular exam Rhythm:Regular Rate:Normal     Neuro/Psych C4 Quadriplegia negative psych ROS   GI/Hepatic negative GI ROS, Neg liver ROS,   Endo/Other  negative endocrine ROS  Renal/GU negative Renal ROS   Neurogenic bladder    Musculoskeletal negative musculoskeletal ROS (+)   Abdominal   Peds  Hematology negative hematology ROS (+)   Anesthesia Other Findings Day of surgery medications reviewed with the patient.  Reproductive/Obstetrics                             Anesthesia Physical Anesthesia Plan  ASA: III  Anesthesia Plan: General   Post-op Pain Management:    Induction: Intravenous  PONV Risk Score and Plan: 3 and Midazolam, Dexamethasone and Ondansetron  Airway Management Planned: LMA  Additional Equipment:   Intra-op Plan:   Post-operative Plan: Extubation in OR  Informed Consent: I have reviewed the patients History and Physical, chart, labs and discussed the procedure including the risks, benefits and alternatives for the proposed anesthesia with the patient or authorized representative who has indicated his/her understanding and acceptance.   Dental advisory given  Plan Discussed with: CRNA  Anesthesia Plan Comments: (Risks/benefits of general anesthesia discussed with patient including risk of damage to teeth, lips, gum, and tongue, nausea/vomiting, allergic reactions to medications, and the possibility of  heart attack, stroke and death.  All patient questions answered.  Patient wishes to proceed.)        Anesthesia Quick Evaluation

## 2017-06-10 NOTE — Progress Notes (Signed)
A consult was received from a surgeon for Gent/Vanc for surgical prophylaxis  per pharmacy dosing.  The patient's profile has been reviewed for ht/wt/allergies/indication/available labs.   A one time order has been placed for Gentamicin 350 mg and Vancomycin 1 Gm.  Further antibiotics/pharmacy consults should be ordered by admitting physician if indicated.                       Thank you, Lorenza EvangelistGreen, Von Quintanar R 06/10/2017  6:43 AM

## 2017-06-11 ENCOUNTER — Other Ambulatory Visit: Payer: Self-pay | Admitting: Urology

## 2017-06-11 ENCOUNTER — Encounter (HOSPITAL_COMMUNITY): Payer: Self-pay | Admitting: Urology

## 2017-06-25 ENCOUNTER — Encounter (HOSPITAL_COMMUNITY): Payer: Self-pay | Admitting: *Deleted

## 2017-06-25 ENCOUNTER — Other Ambulatory Visit: Payer: Self-pay

## 2017-07-01 ENCOUNTER — Encounter (HOSPITAL_COMMUNITY): Payer: Self-pay | Admitting: Emergency Medicine

## 2017-07-01 ENCOUNTER — Ambulatory Visit (HOSPITAL_COMMUNITY): Payer: Medicare Other

## 2017-07-01 ENCOUNTER — Other Ambulatory Visit: Payer: Self-pay

## 2017-07-01 ENCOUNTER — Ambulatory Visit (HOSPITAL_COMMUNITY): Payer: Medicare Other | Admitting: Certified Registered Nurse Anesthetist

## 2017-07-01 ENCOUNTER — Encounter (HOSPITAL_COMMUNITY)
Admission: RE | Disposition: A | Discharge status: Home / Self Care | Payer: Self-pay | Source: Ambulatory Visit | Attending: Urology

## 2017-07-01 ENCOUNTER — Ambulatory Visit (HOSPITAL_COMMUNITY)
Admission: RE | Admit: 2017-07-01 | Discharge: 2017-07-01 | Disposition: A | Discharge status: Home / Self Care | Payer: Medicare Other | Source: Ambulatory Visit | Attending: Urology | Admitting: Urology

## 2017-07-01 DIAGNOSIS — Z885 Allergy status to narcotic agent status: Secondary | ICD-10-CM | POA: Insufficient documentation

## 2017-07-01 DIAGNOSIS — G8250 Quadriplegia, unspecified: Secondary | ICD-10-CM | POA: Diagnosis not present

## 2017-07-01 DIAGNOSIS — N200 Calculus of kidney: Secondary | ICD-10-CM | POA: Insufficient documentation

## 2017-07-01 DIAGNOSIS — N319 Neuromuscular dysfunction of bladder, unspecified: Secondary | ICD-10-CM | POA: Insufficient documentation

## 2017-07-01 DIAGNOSIS — J45909 Unspecified asthma, uncomplicated: Secondary | ICD-10-CM | POA: Diagnosis not present

## 2017-07-01 DIAGNOSIS — Z79899 Other long term (current) drug therapy: Secondary | ICD-10-CM | POA: Diagnosis not present

## 2017-07-01 HISTORY — PX: CYSTOSCOPY/URETEROSCOPY/HOLMIUM LASER/STENT PLACEMENT: SHX6546

## 2017-07-01 LAB — BASIC METABOLIC PANEL
Anion gap: 9 (ref 5–15)
BUN: 19 mg/dL (ref 6–20)
CHLORIDE: 106 mmol/L (ref 101–111)
CO2: 24 mmol/L (ref 22–32)
CREATININE: 0.44 mg/dL — AB (ref 0.61–1.24)
Calcium: 8.6 mg/dL — ABNORMAL LOW (ref 8.9–10.3)
GFR calc non Af Amer: >60 mL/min (ref >60)
Glucose, Bld: 101 mg/dL — ABNORMAL HIGH (ref 65–99)
Potassium: 3.4 mmol/L — ABNORMAL LOW (ref 3.5–5.1)
Sodium: 139 mmol/L (ref 135–145)

## 2017-07-01 LAB — CBC
HEMATOCRIT: 39 % (ref 39.0–52.0)
HEMOGLOBIN: 13.4 g/dL (ref 13.0–17.0)
MCH: 29.5 pg (ref 26.0–34.0)
MCHC: 34.4 g/dL (ref 30.0–36.0)
MCV: 85.7 fL (ref 78.0–100.0)
Platelets: 171 10*3/uL (ref 150–400)
RBC: 4.55 MIL/uL (ref 4.22–5.81)
RDW: 14.1 % (ref 11.5–15.5)
WBC: 6 10*3/uL (ref 4.0–10.5)

## 2017-07-01 SURGERY — CYSTOSCOPY/URETEROSCOPY/HOLMIUM LASER/STENT PLACEMENT
Anesthesia: General | Laterality: Right

## 2017-07-01 MED ORDER — SODIUM CHLORIDE 0.9 % IR SOLN
Status: DC | PRN
  Administered 2017-07-01: 6000 mL

## 2017-07-01 MED ORDER — ONDANSETRON HCL 4 MG/2ML IJ SOLN
INTRAMUSCULAR | Status: DC | PRN
  Administered 2017-07-01: 4 mg via INTRAVENOUS

## 2017-07-01 MED ORDER — ACETAMINOPHEN 10 MG/ML IV SOLN: 1000.0000 mg | Freq: Once | INTRAVENOUS | Status: DC

## 2017-07-01 MED ORDER — OXYCODONE HCL 5 MG/5ML PO SOLN: 5.0000 mg | Freq: Once | ORAL | Status: DC | PRN

## 2017-07-01 MED ORDER — MIDAZOLAM HCL 2 MG/2ML IJ SOLN
INTRAMUSCULAR | Status: AC
  Filled 2017-07-01: qty 2

## 2017-07-01 MED ORDER — LIDOCAINE HCL 2 % EX GEL
CUTANEOUS | Status: AC
  Filled 2017-07-01: qty 5

## 2017-07-01 MED ORDER — PROPOFOL 10 MG/ML IV BOLUS
INTRAVENOUS | Status: AC
Start: 2017-07-01 — End: 2017-07-01
  Filled 2017-07-01: qty 20

## 2017-07-01 MED ORDER — FENTANYL CITRATE (PF) 100 MCG/2ML IJ SOLN
25.0000 ug | INTRAMUSCULAR | Status: DC | PRN
  Administered 2017-07-01 (×2): 25 ug via INTRAVENOUS

## 2017-07-01 MED ORDER — ONDANSETRON HCL 4 MG/2ML IJ SOLN: 4.0000 mg | Freq: Four times a day (QID) | INTRAMUSCULAR | Status: DC | PRN

## 2017-07-01 MED ORDER — ACETAMINOPHEN 10 MG/ML IV SOLN
INTRAVENOUS | Status: AC
  Filled 2017-07-01: qty 100

## 2017-07-01 MED ORDER — VANCOMYCIN HCL IN DEXTROSE 1-5 GM/200ML-% IV SOLN
1000.0000 mg | Freq: Once | INTRAVENOUS | Status: AC
  Administered 2017-07-01: 1000 mg via INTRAVENOUS
  Filled 2017-07-01: qty 200

## 2017-07-01 MED ORDER — PROPOFOL 10 MG/ML IV BOLUS
INTRAVENOUS | Status: DC | PRN
  Administered 2017-07-01: 160 mg via INTRAVENOUS

## 2017-07-01 MED ORDER — MIDAZOLAM HCL 5 MG/5ML IJ SOLN
INTRAMUSCULAR | Status: DC | PRN
  Administered 2017-07-01: 2 mg via INTRAVENOUS

## 2017-07-01 MED ORDER — FENTANYL CITRATE (PF) 100 MCG/2ML IJ SOLN
INTRAMUSCULAR | Status: AC
  Filled 2017-07-01: qty 2

## 2017-07-01 MED ORDER — VANCOMYCIN HCL IN DEXTROSE 1-5 GM/200ML-% IV SOLN
INTRAVENOUS | Status: AC
  Filled 2017-07-01: qty 200

## 2017-07-01 MED ORDER — LIDOCAINE 2% (20 MG/ML) 5 ML SYRINGE
INTRAMUSCULAR | Status: DC | PRN
  Administered 2017-07-01: 60 mg via INTRAVENOUS

## 2017-07-01 MED ORDER — LACTATED RINGERS IV SOLN
INTRAVENOUS | Status: DC | PRN
  Administered 2017-07-01 (×2): via INTRAVENOUS

## 2017-07-01 MED ORDER — GENTAMICIN SULFATE 40 MG/ML IJ SOLN
340.0000 mg | Freq: Once | INTRAMUSCULAR | Status: AC
  Administered 2017-07-01: 340 mg via INTRAVENOUS
  Filled 2017-07-01: qty 8.5

## 2017-07-01 MED ORDER — ACETAMINOPHEN 10 MG/ML IV SOLN
1000.0000 mg | Freq: Once | INTRAVENOUS | Status: AC
  Administered 2017-07-01: 1000 mg via INTRAVENOUS

## 2017-07-01 MED ORDER — OXYCODONE HCL 5 MG PO TABS: 5.0000 mg | ORAL_TABLET | Freq: Once | ORAL | Status: DC | PRN

## 2017-07-01 MED ORDER — EPHEDRINE SULFATE-NACL 50-0.9 MG/10ML-% IV SOSY
PREFILLED_SYRINGE | INTRAVENOUS | Status: DC | PRN
  Administered 2017-07-01 (×2): 10 mg via INTRAVENOUS

## 2017-07-01 SURGICAL SUPPLY — 24 items
BAG URINE DRAINAGE (UROLOGICAL SUPPLIES) ×3 IMPLANT
BAG URO CATCHER STRL LF (MISCELLANEOUS) ×3 IMPLANT
BASKET ZERO TIP NITINOL 2.4FR (BASKET) ×3 IMPLANT
CATH FOLEY 2WAY 5CC 16FR (CATHETERS) ×2
CATH INTERMIT  6FR 70CM (CATHETERS) ×3 IMPLANT
CATH URET 5FR 28IN CONE TIP (BALLOONS) ×2
CATH URET 5FR 70CM CONE TIP (BALLOONS) ×1 IMPLANT
CATH URET WHISTLE 6FR (CATHETERS) IMPLANT
CATH URTH STD 16FR FL 2W DRN (CATHETERS) ×1 IMPLANT
CLOTH BEACON ORANGE TIMEOUT ST (SAFETY) ×3 IMPLANT
COVER FOOTSWITCH UNIV (MISCELLANEOUS) ×3 IMPLANT
COVER SURGICAL LIGHT HANDLE (MISCELLANEOUS) ×3 IMPLANT
FIBER LASER TRAC TIP (UROLOGICAL SUPPLIES) ×3 IMPLANT
GLOVE BIO SURGEON STRL SZ7.5 (GLOVE) ×3 IMPLANT
GOWN STRL REUS W/TWL XL LVL3 (GOWN DISPOSABLE) ×3 IMPLANT
GUIDEWIRE STR DUAL SENSOR (WIRE) ×3 IMPLANT
MANIFOLD NEPTUNE II (INSTRUMENTS) ×3 IMPLANT
PACK CYSTO (CUSTOM PROCEDURE TRAY) ×3 IMPLANT
SHEATH ACCESS URETERAL 24CM (SHEATH) ×3 IMPLANT
SHEATH URETERAL 12FRX35CM (MISCELLANEOUS) ×3 IMPLANT
STENT CONTOUR 6FRX26X.038 (STENTS) ×3 IMPLANT
TUBING CONNECTING 10 (TUBING) ×2 IMPLANT
TUBING CONNECTING 10' (TUBING) ×1
WIRE COONS/BENSON .038X145CM (WIRE) IMPLANT

## 2017-07-01 NOTE — Anesthesia Postprocedure Evaluation (Signed)
Anesthesia Post Note  Patient: Ryan AddisonLarry J Watkins  Procedure(s) Performed: CYSTOSCOPY/URETEROSCOPY/HOLMIUM LASER/STENT PLACEMENT (Right )     Patient location during evaluation: PACU Anesthesia Type: General Level of consciousness: awake and alert Pain management: pain level controlled Vital Signs Assessment: post-procedure vital signs reviewed and stable Respiratory status: spontaneous breathing, nonlabored ventilation, respiratory function stable and patient connected to nasal cannula oxygen Cardiovascular status: blood pressure returned to baseline and stable Postop Assessment: no apparent nausea or vomiting Anesthetic complications: no    Last Vitals:  Vitals:   07/01/17 1030 07/01/17 1055  BP: (!) 134/94 116/83  Pulse: 86 97  Resp: 11 12  Temp: 36.6 C 36.4 C  SpO2: 100% 97%    Last Pain:  Vitals:   07/01/17 1055  TempSrc:   PainSc: 1                  Zipporah Finamore S

## 2017-07-01 NOTE — Op Note (Signed)
Preoperative diagnosis: Right renal stone Postoperative diagnosis: Right renal stone  Procedure: Cystoscopy, right ureteroscopy, holmium laser lithotripsy, stone basket extraction and ureteral stent exchange  Surgeon: Mena GoesEskridge  Anesthesia: General  Indication for procedure: 32 year old paraplegic male with enlarging right renal pelvic stone.  He underwent pre-stenting a couple of weeks ago.  Findings: Ureter is tight about the iliacs and in the proximal ureter approaching the UPJ.  Excellent stone fragmentation and clearance.  Large stone found in the right renal pelvis and the smaller stone was noted in a posterior upper pole calyx with a narrow infundibulum.  Description of procedure: After consent was obtained the patient brought to the operating room.  After adequate anesthesia is placed in lithotomy position prepped and draped in the usual sterile fashion.  A timeout was performed to confirm the patient and procedure.  The cystoscope was passed per urethra and the bladder irrigated several times.  The right ureteral stent was grasped and removed through the urethral meatus and a sensor wire was advanced.  I then used the access sheath to place a second wire which was a guidewire.  The access sheath would not advance through the distal ureter and therefore I swapped the guidewire out with a Glidewire.  I then went over the sensor adjacent to the guidewire and the access sheath now passed easier but still a lot of resistance up over the iliacs and this is where it stopped.  I then passed the dual-lumen ureteroscope, digital, but it would not go through the narrow area in the proximal ureter.  I swapped out to the single-channel digital ureteroscope and this passed into the collecting system where the large stone was noted.  A 200 m laser fiber was advanced and the stone fragmented very well at 0.3 and 50.  It was dusted and when he got down to about a third of its size I was able to flick it into  the upper pole like I had done a couple of the other larger fragments as it fragmented.  In the upper pole it was finished off and then I used a 0 tip basket to meticulously clear the upper middle and lower calyces which was mostly dusted.  There was a narrow infundibulum to a posterior upper pole calyx where the other stone was noted on the CT and this one was about 5 mm.  It was fragmented and the largest fragment removed.  Again I went through the calyces with a 0 tip basket grasping and looking for larger pieces and could find none.  The collecting system was inspected a final time there were no other stones noted on fluoroscopy.  The renal pelvis and proximal ureter was normal and the narrow area had dilated nicely with mucosal damage.  The ureter was inspected on the way out and again noted to be dilated along the mid to distal ureter from the sheath but no mucosal down there were no stone fragments or injury in the ureter and the wire was backloaded on the cystoscope and a 6x26 cm stent was advanced.  The wire was removed with a good coil seen in the kidney and a good coil in the bladder.  So that the patient would not have to continue CIC nor come back for cystoscopy in the office I placed a 16 French Foley catheter left to gravity drainage and taped of the stent string to the Foley.  I will have them remove that in 6 days.  The Foley was left  to gravity drainage and he was awakened taken to recovery room in stable condition.  Complications: None  Blood loss: Minimal  Specimen: Large stone fragments office lab  Drains: 6 x 26 cm right ureteral stent tethered to 16 [place] Foley  Disposition: Patient stable to PACU

## 2017-07-01 NOTE — Anesthesia Procedure Notes (Signed)
Procedure Name: LMA Insertion Performed by: Euel Castile J, CRNA Pre-anesthesia Checklist: Patient identified, Emergency Drugs available, Suction available, Patient being monitored and Timeout performed Patient Re-evaluated:Patient Re-evaluated prior to induction Oxygen Delivery Method: Circle system utilized Preoxygenation: Pre-oxygenation with 100% oxygen Induction Type: IV induction Ventilation: Mask ventilation without difficulty LMA: LMA inserted LMA Size: 4.0 Number of attempts: 1 Placement Confirmation: positive ETCO2,  CO2 detector and breath sounds checked- equal and bilateral Tube secured with: Tape Dental Injury: Teeth and Oropharynx as per pre-operative assessment        

## 2017-07-01 NOTE — Discharge Instructions (Signed)
Indwelling Urinary Catheter and Ureteral Stent Care, Adult  REMOVAL OF THE FOLEY/STENT: remove the foley and stent as instructed on Monday morning, July 07, 2017.   Take good care of your catheter to keep it working and to prevent problems. How to wear your catheter Attach your catheter to your leg with tape (adhesive tape) or a leg strap. Make sure it is not too tight. If you use tape, remove any bits of tape that are already on the catheter. How to wear a drainage bag You should have:  A large overnight bag.  A small leg bag.  Overnight Bag You may wear the overnight bag at any time. Always keep the bag below the level of your bladder but off the floor. When you sleep, put a clean plastic bag in a wastebasket. Then hang the bag inside the wastebasket. Leg Bag Never wear the leg bag at night. Always wear the leg bag below your knee. Keep the leg bag secure with a leg strap or tape. How to care for your skin  Clean the skin around the catheter at least once every day.  Shower every day. Do not take baths.  Put creams, lotions, or ointments on your genital area only as told by your doctor.  Do not use powders, sprays, or lotions on your genital area. How to clean your catheter and your skin 1. Wash your hands with soap and water. 2. Wet a washcloth in warm water and gentle (mild) soap. 3. Use the washcloth to clean the skin where the catheter enters your body. Clean downward and wipe away from the catheter in small circles. Do not wipe toward the catheter. 4. Pat the area dry with a clean towel. Make sure to clean off all soap. How to care for your drainage bags Empty your drainage bag when it is ?- full or at least 2-3 times a day. Replace your drainage bag once a month or sooner if it starts to smell bad or look dirty. Do not clean your drainage bag unless told by your doctor. Emptying a drainage bag  Supplies Needed  Rubbing alcohol.  Gauze pad or cotton ball.  Tape or  a leg strap.  Steps 1. Wash your hands with soap and water. 2. Separate (detach) the bag from your leg. 3. Hold the bag over the toilet or a clean container. Keep the bag below your hips and bladder. This stops pee (urine) from going back into the tube. 4. Open the pour spout at the bottom of the bag. 5. Empty the pee into the toilet or container. Do not let the pour spout touch any surface. 6. Put rubbing alcohol on a gauze pad or cotton ball. 7. Use the gauze pad or cotton ball to clean the pour spout. 8. Close the pour spout. 9. Attach the bag to your leg with tape or a leg strap. 10. Wash your hands.  Changing a drainage bag Supplies Needed  Alcohol wipes.  A clean drainage bag.  Adhesive tape or a leg strap.  Steps 1. Wash your hands with soap and water. 2. Separate the dirty bag from your leg. 3. Pinch the rubber catheter with your fingers so that pee does not spill out. 4. Separate the catheter tube from the drainage tube where these tubes connect (at the connection valve). Do not let the tubes touch any surface. 5. Clean the end of the catheter tube with an alcohol wipe. Use a different alcohol wipe to clean the end  of the drainage tube. 6. Connect the catheter tube to the drainage tube of the clean bag. 7. Attach the new bag to the leg with adhesive tape or a leg strap. 8. Wash your hands.  How to prevent infection and other problems  Never pull on your catheter or try to remove it. Pulling can damage tissue in your body.  Always wash your hands before and after touching your catheter.  If a leg strap gets wet, replace it with a dry one.  Drink enough fluids to keep your pee clear or pale yellow, or as told by your doctor.  Do not let the drainage bag or tubing touch the floor.  Wear cotton underwear.  If you are male, wipe from front to back after you poop (have a bowel movement).  Check on the catheter often to make sure it works and the tubing is not  twisted. Get help if:  Your pee is cloudy.  Your pee smells unusually bad.  Your pee is not draining into the bag.  Your tube gets clogged.  Your catheter starts to leak.  Your bladder feels full. Get help right away if:  You have redness, swelling, or pain where the catheter enters your body.  You have fluid, pus, or a bad smell coming from the area where the catheter enters your body.  The area where the catheter enters your body feels warm.  You have a fever.  You have pain in your: ? Stomach (abdomen). ? Legs. ? Lower back. ? Bladder.  You see blood fill the catheter.  Your pee is pink or red.  You feel sick to your stomach (nauseous).  You throw up (vomit).  You have chills.  Your catheter gets pulled out.

## 2017-07-01 NOTE — Anesthesia Preprocedure Evaluation (Signed)
Anesthesia Evaluation  Patient identified by MRN, date of birth, ID band Patient awake    Reviewed: Allergy & Precautions, H&P , NPO status , Patient's Chart, lab work & pertinent test results  Airway Mallampati: II   Neck ROM: full    Dental   Pulmonary asthma ,    breath sounds clear to auscultation       Cardiovascular negative cardio ROS   Rhythm:regular Rate:Normal     Neuro/Psych C4 quadraplegia    GI/Hepatic   Endo/Other    Renal/GU    Neurogenic bladder    Musculoskeletal   Abdominal   Peds  Hematology   Anesthesia Other Findings   Reproductive/Obstetrics                             Anesthesia Physical Anesthesia Plan  ASA: III  Anesthesia Plan: General   Post-op Pain Management:    Induction: Intravenous  PONV Risk Score and Plan: 2 and Ondansetron and Treatment may vary due to age or medical condition  Airway Management Planned: LMA  Additional Equipment:   Intra-op Plan:   Post-operative Plan:   Informed Consent: I have reviewed the patients History and Physical, chart, labs and discussed the procedure including the risks, benefits and alternatives for the proposed anesthesia with the patient or authorized representative who has indicated his/her understanding and acceptance.     Plan Discussed with: CRNA, Anesthesiologist and Surgeon  Anesthesia Plan Comments:         Anesthesia Quick Evaluation

## 2017-07-01 NOTE — Transfer of Care (Signed)
Immediate Anesthesia Transfer of Care Note  Patient: Ryan AddisonLarry J Bachand  Procedure(s) Performed: CYSTOSCOPY/URETEROSCOPY/HOLMIUM LASER/STENT PLACEMENT (Right )  Patient Location: PACU  Anesthesia Type:General  Level of Consciousness: awake, alert  and oriented  Airway & Oxygen Therapy: Patient Spontanous Breathing and Patient connected to face mask oxygen  Post-op Assessment: Report given to RN and Post -op Vital signs reviewed and stable  Post vital signs: Reviewed and stable  Last Vitals:  Vitals:   07/01/17 0600  BP: (!) 122/93  Pulse: 69  Resp: 16  Temp: 36.7 C  SpO2: 96%    Last Pain:  Vitals:   07/01/17 0609  TempSrc:   PainSc: 0-No pain      Patients Stated Pain Goal: 4 (07/01/17 [id number])  Complications: No apparent anesthesia complications

## 2017-07-01 NOTE — Interval H&P Note (Signed)
History and Physical Interval Note:  07/01/2017 7:24 AM  Ryan Watkins  has presented today for surgery, with the diagnosis of RIGHT RENAL STONE  The various methods of treatment have been discussed with the patient and family. After consideration of risks, benefits and other options for treatment, the patient has consented to  Procedure(s): CYSTOSCOPY/URETEROSCOPY/HOLMIUM LASER/STENT PLACEMENT (Right) as a surgical intervention. He is s/p botox and right stent. He's had dysreflexic symptoms with the stent ("sweatiness"), but no fever. The patient's history has been reviewed, patient examined, no change in status, stable for surgery.  I have reviewed the patient's chart and labs.  Questions were answered to the patient's satisfaction.     Jerilee FieldMatthew Maxamus Colao

## 2018-03-24 ENCOUNTER — Other Ambulatory Visit: Payer: Self-pay | Admitting: Urology

## 2018-03-31 ENCOUNTER — Other Ambulatory Visit: Payer: Self-pay

## 2018-03-31 ENCOUNTER — Encounter (HOSPITAL_COMMUNITY): Payer: Self-pay | Admitting: *Deleted

## 2018-04-22 NOTE — H&P (Signed)
Office Visit Report     04/17/2018   --------------------------------------------------------------------------------   [name]  MRN: [id number]  PRIMARY CARE:  Delorse Lek (retired), MD  DOB: 11/06/1985, 32 year old Male  REFERRING:  Gwynneth Macleod, NP  SSN: [id number]  PROVIDER:  [name], M.D.    TREATING:  Anne Fu    LOCATION:  [facility] [phone]   --------------------------------------------------------------------------------   CC/HPI: 04/17/18: Here today for pre-operative appointment prior to undergoing cystoscopy and botox administration in the bladder. He has this done about once per year and usually does well in managing leaking between self-catheterizations. Pt has hx of neurogenic bladder and manages bladder emptying/output with CIC performed several times per day. He underwent URS for a right renal pelvis stone earlier this year. He reports no interval hematuria, infection treatment since last evaluation. No interval stone material passage. He has not had new pain or discomfort in the flanks or back. Denies recent fevers.     ALLERGIES: Morphine Derivatives    MEDICATIONS: Advil 100 mg tablet Oral  Breo Ellipta  Fluticasone Propionate 50 MCG/ACT Nasal Suspension Nasal  Miralax  Sildenafil Citrate 20 mg tablet 1 to 5 tablets prn for intimacy     GU PSH: Cysto Uretero Lithotripsy - 2015 Cystolithotomy - 2008 Cystoscopy Insert Stent, Right - 06/10/2017, 2015, 2015 Cystoscopy Ureteroscopy, Right - 06/10/2017, 2015 Cystourethroscopy, W/Injection For Chemodenervation Of Bladder - 06/10/2017, 2018, 2017 Ureteroscopic laser litho, Right - 07/01/2017 Urethrolysis - 2016, 2015, 2015, 2014, 2013, 2013      PSH Notes: Cystoscopy With Injection For Chemodenervation Of Bladder, Urologic Surgery, Urologic Surgery, Cystoscopy With Ureteroscopy With Lithotripsy, Cystoscopy With Insertion Of Ureteral Stent Right, Cystoscopy With Insertion Of  Ureteral Stent Right, Cystoscopy With Ureteroscopy Right, Urologic Surgery, Urologic Surgery, Urologic Surgery, Urologic Surgery, Wrist Surgery, Cosmetic Surgery, Surgical Flaps, Cervical Vertebral Fusion, Bladder Cystotomy With Direct Removal Of Calculus, Neck Surgery, Oral Surgery Tooth Extraction   NON-GU PSH: Dental Surgery Procedure - 2008 Neck Spine Fusion - 2008    GU PMH: 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, - 03/19/2017, - 2018, - 2017, Reflex neuropathic bladder, not elsewhere classified, - 2017 Renal calculus - 05/08/2017, - 2018 (Stable), - 2017, Nephrolithiasis, - 2015 Other ejaculatory dysfunction - 2017 Chronic cystitis (w/o hematuria), Chronic cystitis - 2017 Bladder, Neuromuscular dysfunction, Unspec, Neurogenic bladder - 2016 Male Infertility, Unspec, Male infertility - 2016 Urge incontinence, Urge incontinence of urine - 2015 Urinary incontinence, Unspec, Urinary incontinence - 2014 Urinary Tract Inf, Unspec site, Urinary tract infection - 2014, Pyuria, - 2014      PMH Notes:  2007-02-26 14:03:51 - Note: Hay Fever   NON-GU PMH: Bacteriuria, Bacteriuria, asymptomatic - 2016 Encounter for general adult medical examination without abnormal findings, Encounter for preventive health examination - 2016    FAMILY HISTORY: Brain Cancer - Grandfather Cervical Cancer - Mother Heart Disease - Father, Grandfather Lung Cancer - Mother   SOCIAL HISTORY: Marital Status: Single Preferred Language: English; Ethnicity: Not Hispanic Or Latino; Race: White Current Smoking Status: Patient has never smoked.  Does drink.  Patient's occupation is/was disabled.     Notes: Never A Smoker, Alcohol Use, Marital History - Single, Tobacco Use, Caffeine Use   REVIEW OF SYSTEMS:    GU Review Male:   Patient denies frequent urination, hard to postpone urination, burning/ pain with urination, get up at night to urinate, leakage of urine, stream starts and stops, trouble starting your stream, have  to strain to urinate ,  erection problems, and penile pain.  Gastrointestinal (Upper):   Patient denies nausea, vomiting, and indigestion/ heartburn.  Gastrointestinal (Lower):   Patient reports constipation. Patient denies diarrhea.  Constitutional:   Patient denies fever, night sweats, weight loss, and fatigue.  Skin:   Patient denies skin rash/ lesion and itching.  Eyes:   Patient denies blurred vision and double vision.  Ears/ Nose/ Throat:   Patient denies sore throat and sinus problems.  Hematologic/Lymphatic:   Patient denies swollen glands and easy bruising.  Cardiovascular:   Patient denies leg swelling and chest pains.  Respiratory:   Patient denies cough and shortness of breath.  Endocrine:   Patient denies excessive thirst.  Musculoskeletal:   Patient denies back pain and joint pain.  Neurological:   Patient denies headaches and dizziness.  Psychologic:   Patient denies depression and anxiety.   VITAL SIGNS:      04/17/2018 01:44 PM  Weight 160 lb / 72.57 kg  BP 93/70 mmHg  Pulse 79 /min  Temperature 98.0 F / 36.6 C   MULTI-SYSTEM PHYSICAL EXAMINATION:    Constitutional: Mild physical deformities. Well-nourished. Normally developed. Good grooming. He is wheelchair bound and does well controlling the motorized chair.  Neck: Neck symmetrical, not swollen. Normal tracheal position.  Respiratory: No labored breathing, no use of accessory muscles.   Cardiovascular: Normal temperature, normal extremity pulses, no swelling, no varicosities.  Skin: No paleness, no jaundice, no cyanosis. No lesion, no ulcer, no rash.  Neurologic / Psychiatric: Oriented to time, oriented to place, oriented to person. No depression, no anxiety, no agitation.  Gastrointestinal: No mass, no tenderness, no rigidity, non obese abdomen. No CVAT.     PAST DATA REVIEWED:  Source Of History:  Patient  Records Review:   Previous Hospital Records, Previous Patient Records  Urine Test Review:   Urinalysis   X-Ray Review: Renal Ultrasound: Reviewed Films. Discussed With Patient.     04/17/18  Urinalysis  Urine Appearance Clear   Urine Color Yellow   Urine Glucose Neg mg/dL  Urine Bilirubin Neg mg/dL  Urine Ketones Neg mg/dL  Urine Specific Gravity 1.015   Urine Blood Neg ery/uL  Urine pH 6.0   Urine Protein Neg mg/dL  Urine Urobilinogen 0.2 mg/dL  Urine Nitrites Neg   Urine Leukocyte Esterase Trace leu/uL  Urine WBC/hpf 6 - 10/hpf   Urine RBC/hpf 0 - 2/hpf   Urine Epithelial Cells 0 - 5/hpf   Urine Bacteria Rare (0-9/hpf)   Urine Mucous Not Present   Urine Yeast NS (Not Seen)   Urine Trichomonas Not Present   Urine Cystals NS (Not Seen)   Urine Casts NS (Not Seen)   Urine Sperm Not Present    PROCEDURES:         Renal Ultrasound - [id number]  Right Kidney: Length:10.9 cm Depth:4.3 cm Cortical Width: 0.62 cm Width:4.7 cm  Left Kidney: Length: 10.5 cm Depth: 5.3 cm Cortical Width: 0.85 cm Width:5.3 cm  Left Kidney/Ureter:  ? echogenic foci, largest= 0.45cm MP  Right Kidney/Ureter:  ?Multiple stones, largest= 0.59cm LP. ? Cystic area UP vs area of fullness= 0.53cm, Cortical thinning.  Bladder:  Not seen.               Urinalysis w/Scope Dipstick Dipstick Cont'd Micro  Color: Yellow Bilirubin: Neg mg/dL WBC/hpf: 6 - 60/AVW  Appearance: Clear Ketones: Neg mg/dL RBC/hpf: 0 - 2/hpf  Specific Gravity: 1.015 Blood: Neg ery/uL Bacteria: Rare (0-9/hpf)  pH: 6.0 Protein: Neg mg/dL Cystals: NS (Not  Seen)  Glucose: Neg mg/dL Urobilinogen: 0.2 mg/dL Casts: NS (Not Seen)    Nitrites: Neg Trichomonas: Not Present    Leukocyte Esterase: Trace leu/uL Mucous: Not Present      Epithelial Cells: 0 - 5/hpf      Yeast: NS (Not Seen)      Sperm: Not Present    ASSESSMENT:      ICD-10 Details  1 GU:   Renal calculus - N20.0   2   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  Nitrofurantoin 100 mg capsule 1 capsule by mouth twice a day for 5 days and then 1 capsule by mouth daily at bedtime  Start: 06/19/2017  Discontinue: 04/17/2018  - Reason: The medication cycle was completed.            Orders Labs Urine Culture  X-Rays: Renal Ultrasound          Schedule         Document Letter(s):  Created for Patient: Clinical Summary         Notes:   Multiple echogenic foci to suggest small stones vs vascular calcifications on u/s. No obvious obstructive signs on u/s today. A urine c/s will be sent. I answered all questions about upcoming procedure to the best of my ability with understanding expressed by the patient. He will proceed with planned cysto/botox administration on 12/20 with Dr Mena Goes.   CC Dr Mena Goes        Next Appointment:      Next Appointment: 04/24/2018 09:45 AM    Appointment Type: Surgery     Location: [facility] [phone]    Provider: [name], M.D.    Reason for Visit: WL/OP CYSTO WITH BOTOX      * Signed by Anne Fu on 04/17/18 at 2:00 PM (EST)*     The information contained in this medical record document is considered private and confidential patient information. This information can only be used for the medical diagnosis and/or medical services that are being provided by the patient's selected caregivers. This information can only be distributed outside of the patient's care if the patient agrees and signs waivers of authorization for this information to be sent to an outside source or route.  Addendum: I discussed with NP Hollice Espy and agree with his findings and plan.    Urine culture positive for: Enterococcus faecalis Ampicillin                                <2       S        S Ciprofloxacin                             >2       R        R Daptomycin                                 1       S        S Nitrofurantoin                            <  32                S Gent. Synergy                           >500       R        R Levofloxacin                               4       I        I Linezolid                                  2       S        S Penicillin                                 2       S        S Strep. Synergy                         <1000       S        S Tetracycline                              >8       R        R Vancomycin                                 4       S        S  I started pt on NF. Will need to add abx to cefazolin.

## 2018-04-24 ENCOUNTER — Ambulatory Visit (HOSPITAL_COMMUNITY)
Admission: RE | Admit: 2018-04-24 | Discharge: 2018-04-24 | Disposition: A | Discharge status: Home / Self Care | Payer: Medicare Other | Source: Ambulatory Visit | Attending: Urology | Admitting: Urology

## 2018-04-24 ENCOUNTER — Ambulatory Visit (HOSPITAL_COMMUNITY): Payer: Medicare Other | Admitting: Anesthesiology

## 2018-04-24 ENCOUNTER — Ambulatory Visit (HOSPITAL_COMMUNITY)
Admission: RE | Disposition: A | Discharge status: Home / Self Care | Payer: Self-pay | Source: Ambulatory Visit | Attending: Urology

## 2018-04-24 ENCOUNTER — Other Ambulatory Visit: Payer: Self-pay

## 2018-04-24 ENCOUNTER — Ambulatory Visit (HOSPITAL_COMMUNITY): Payer: Medicare Other

## 2018-04-24 ENCOUNTER — Encounter (HOSPITAL_COMMUNITY): Payer: Self-pay

## 2018-04-24 DIAGNOSIS — G8250 Quadriplegia, unspecified: Secondary | ICD-10-CM | POA: Insufficient documentation

## 2018-04-24 DIAGNOSIS — N200 Calculus of kidney: Secondary | ICD-10-CM

## 2018-04-24 DIAGNOSIS — Z791 Long term (current) use of non-steroidal anti-inflammatories (NSAID): Secondary | ICD-10-CM | POA: Diagnosis not present

## 2018-04-24 DIAGNOSIS — N319 Neuromuscular dysfunction of bladder, unspecified: Secondary | ICD-10-CM | POA: Insufficient documentation

## 2018-04-24 DIAGNOSIS — Z79899 Other long term (current) drug therapy: Secondary | ICD-10-CM | POA: Insufficient documentation

## 2018-04-24 HISTORY — PX: BOTOX INJECTION: SHX5754

## 2018-04-24 LAB — CBC
HCT: 40.7 % (ref 39.0–52.0)
Hemoglobin: 13.6 g/dL (ref 13.0–17.0)
MCH: 29.2 pg (ref 26.0–34.0)
MCHC: 33.4 g/dL (ref 30.0–36.0)
MCV: 87.5 fL (ref 80.0–100.0)
Platelets: 192 10*3/uL (ref 150–400)
RBC: 4.65 MIL/uL (ref 4.22–5.81)
RDW: 14.1 % (ref 11.5–15.5)
WBC: 7.7 10*3/uL (ref 4.0–10.5)
nRBC: 0 % (ref 0.0–0.2)

## 2018-04-24 LAB — BASIC METABOLIC PANEL
ANION GAP: 9 (ref 5–15)
BUN: 16 mg/dL (ref 6–20)
CHLORIDE: 106 mmol/L (ref 98–111)
CO2: 23 mmol/L (ref 22–32)
Calcium: 8.7 mg/dL — ABNORMAL LOW (ref 8.9–10.3)
Creatinine, Ser: 0.47 mg/dL — ABNORMAL LOW (ref 0.61–1.24)
GFR calc non Af Amer: >60 mL/min (ref >60)
Glucose, Bld: 106 mg/dL — ABNORMAL HIGH (ref 70–99)
POTASSIUM: 3.7 mmol/L (ref 3.5–5.1)
SODIUM: 138 mmol/L (ref 135–145)

## 2018-04-24 SURGERY — BOTOX INJECTION
Anesthesia: General

## 2018-04-24 MED ORDER — ACETAMINOPHEN 325 MG PO TABS: 325.0000 mg | ORAL_TABLET | ORAL | Status: DC | PRN

## 2018-04-24 MED ORDER — PROPOFOL 10 MG/ML IV BOLUS
INTRAVENOUS | Status: AC
  Filled 2018-04-24: qty 20

## 2018-04-24 MED ORDER — NITROFURANTOIN MONOHYD MACRO 100 MG PO CAPS: 100.0000 mg | ORAL_CAPSULE | Freq: Two times a day (BID) | ORAL | 0 refills | Status: AC

## 2018-04-24 MED ORDER — PROPOFOL 10 MG/ML IV BOLUS
INTRAVENOUS | Status: DC | PRN
  Administered 2018-04-24: 150 mg via INTRAVENOUS

## 2018-04-24 MED ORDER — FENTANYL CITRATE (PF) 250 MCG/5ML IJ SOLN
INTRAMUSCULAR | Status: DC | PRN
  Administered 2018-04-24 (×3): 50 ug via INTRAVENOUS

## 2018-04-24 MED ORDER — OXYCODONE HCL 5 MG PO TABS: 5.0000 mg | ORAL_TABLET | Freq: Once | ORAL | Status: DC | PRN

## 2018-04-24 MED ORDER — LIDOCAINE 2% (20 MG/ML) 5 ML SYRINGE
INTRAMUSCULAR | Status: DC | PRN
  Administered 2018-04-24: 80 mg via INTRAVENOUS

## 2018-04-24 MED ORDER — SODIUM CHLORIDE (PF) 0.9 % IJ SOLN
INTRAMUSCULAR | Status: AC
  Filled 2018-04-24: qty 10

## 2018-04-24 MED ORDER — SODIUM CHLORIDE 0.9 % IR SOLN
Status: DC | PRN
  Administered 2018-04-24: 3000 mL via INTRAVESICAL

## 2018-04-24 MED ORDER — ONABOTULINUMTOXINA 100 UNITS IJ SOLR
INTRAMUSCULAR | Status: DC | PRN
  Administered 2018-04-24: 100 [IU] via INTRAMUSCULAR

## 2018-04-24 MED ORDER — SODIUM CHLORIDE 0.9 % IV SOLN
INTRAVENOUS | Status: DC | PRN
  Administered 2018-04-24: 25 ug/min via INTRAVENOUS

## 2018-04-24 MED ORDER — ACETAMINOPHEN 160 MG/5ML PO SOLN: 325.0000 mg | ORAL | Status: DC | PRN

## 2018-04-24 MED ORDER — FENTANYL CITRATE (PF) 250 MCG/5ML IJ SOLN
INTRAMUSCULAR | Status: AC
  Filled 2018-04-24: qty 5

## 2018-04-24 MED ORDER — OXYCODONE HCL 5 MG/5ML PO SOLN: 5.0000 mg | Freq: Once | ORAL | Status: DC | PRN

## 2018-04-24 MED ORDER — ONABOTULINUMTOXINA 100 UNITS IJ SOLR
100.0000 [IU] | Freq: Once | INTRAMUSCULAR | Status: AC
  Administered 2018-04-24: 100 [IU] via INTRAMUSCULAR
  Filled 2018-04-24: qty 100

## 2018-04-24 MED ORDER — ONDANSETRON HCL 4 MG/2ML IJ SOLN
INTRAMUSCULAR | Status: DC | PRN
  Administered 2018-04-24: 4 mg via INTRAVENOUS

## 2018-04-24 MED ORDER — MIDAZOLAM HCL 2 MG/2ML IJ SOLN
INTRAMUSCULAR | Status: AC
  Filled 2018-04-24: qty 2

## 2018-04-24 MED ORDER — LACTATED RINGERS IV SOLN
INTRAVENOUS | Status: DC
  Administered 2018-04-24: 11:00:00 via INTRAVENOUS

## 2018-04-24 MED ORDER — SODIUM CHLORIDE 0.9 % IV SOLN
2.0000 g | Freq: Once | INTRAVENOUS | Status: AC
  Administered 2018-04-24: 2 g via INTRAVENOUS
  Filled 2018-04-24: qty 2

## 2018-04-24 MED ORDER — ONDANSETRON HCL 4 MG/2ML IJ SOLN: 4.0000 mg | Freq: Once | INTRAMUSCULAR | Status: DC | PRN

## 2018-04-24 MED ORDER — FENTANYL CITRATE (PF) 100 MCG/2ML IJ SOLN: 25.0000 ug | INTRAMUSCULAR | Status: DC | PRN

## 2018-04-24 MED ORDER — SODIUM CHLORIDE (PF) 0.9 % IJ SOLN
INTRAMUSCULAR | Status: AC
  Filled 2018-04-24: qty 20

## 2018-04-24 MED ORDER — KETOROLAC TROMETHAMINE 30 MG/ML IJ SOLN: 30.0000 mg | Freq: Once | INTRAMUSCULAR | Status: DC | PRN

## 2018-04-24 MED ORDER — MEPERIDINE HCL 50 MG/ML IJ SOLN: 6.2500 mg | INTRAMUSCULAR | Status: DC | PRN

## 2018-04-24 MED ORDER — CEFAZOLIN SODIUM-DEXTROSE 2-4 GM/100ML-% IV SOLN
2.0000 g | Freq: Once | INTRAVENOUS | Status: AC
  Administered 2018-04-24: 2 g via INTRAVENOUS
  Filled 2018-04-24: qty 100

## 2018-04-24 MED ORDER — MIDAZOLAM HCL 2 MG/2ML IJ SOLN
INTRAMUSCULAR | Status: DC | PRN
  Administered 2018-04-24 (×2): 1 mg via INTRAVENOUS

## 2018-04-24 MED ORDER — ONABOTULINUMTOXINA 100 UNITS IJ SOLR
100.0000 [IU] | Freq: Once | INTRAMUSCULAR | Status: DC
  Filled 2018-04-24: qty 100

## 2018-04-24 SURGICAL SUPPLY — 14 items
BAG URO CATCHER STRL LF (MISCELLANEOUS) ×3 IMPLANT
CLOTH BEACON ORANGE TIMEOUT ST (SAFETY) ×3 IMPLANT
COVER WAND RF STERILE (DRAPES) IMPLANT
GLOVE BIOGEL M STRL SZ7.5 (GLOVE) ×3 IMPLANT
GOWN STRL REUS W/TWL LRG LVL3 (GOWN DISPOSABLE) ×3 IMPLANT
GOWN STRL REUS W/TWL XL LVL3 (GOWN DISPOSABLE) ×3 IMPLANT
MANIFOLD NEPTUNE II (INSTRUMENTS) ×3 IMPLANT
NDL SAFETY ECLIPSE 18X1.5 (NEEDLE) IMPLANT
NEEDLE HYPO 18GX1.5 SHARP (NEEDLE)
PACK CYSTO (CUSTOM PROCEDURE TRAY) ×3 IMPLANT
SYR CONTROL 10ML LL (SYRINGE) IMPLANT
TUBING CONNECTING 10 (TUBING) IMPLANT
TUBING CONNECTING 10' (TUBING)
WATER STERILE IRR 3000ML UROMA (IV SOLUTION) ×3 IMPLANT

## 2018-04-24 NOTE — Anesthesia Postprocedure Evaluation (Signed)
Anesthesia Post Note  Patient: Ryan AddisonLarry J Watkins  Procedure(s) Performed: BOTOX INJECTION WITH CYSTOSCOPY (N/A )     Patient location during evaluation: PACU Anesthesia Type: General Level of consciousness: awake Pain management: pain level controlled Vital Signs Assessment: post-procedure vital signs reviewed and stable Respiratory status: spontaneous breathing Cardiovascular status: stable Postop Assessment: no apparent nausea or vomiting Anesthetic complications: no    Last Vitals:  Vitals:   04/24/18 1130 04/24/18 1145  BP: 112/87 117/90  Pulse: 83 67  Resp: 17 15  Temp:    SpO2: 98% 98%    Last Pain:  Vitals:   04/24/18 1145  TempSrc:   PainSc: 0-No pain   Pain Goal: Patients Stated Pain Goal: 4 (04/24/18 0849)               [name]

## 2018-04-24 NOTE — Interval H&P Note (Signed)
History and Physical Interval Note:  04/24/2018 10:02 AM  Ryan AddisonLarry J Boileau  has presented today for surgery, with the diagnosis of NEUROGENIC BLADDER  The various methods of treatment have been discussed with the patient and family. After consideration of risks, benefits and other options for treatment, the patient has consented to  Procedure(s): BOTOX INJECTION WITH CYSTOSCOPY (N/A) as a surgical intervention .  The patient's history has been reviewed, patient examined, no change in status, stable for surgery. He's taking nitrofurantoin. No fever. He has episodes of sweating and feeling flushed as well as incontinence in between catheterizations. I have reviewed the patient's chart and labs.  Questions were answered to the patient's satisfaction. I added ampicillin to abx.     Jerilee FieldMatthew Mikaelah Trostle

## 2018-04-24 NOTE — Anesthesia Procedure Notes (Signed)
Procedure Name: LMA Insertion Date/Time: 04/24/2018 10:43 AM Performed by: Minerva EndsMirarchi, Andersen Iorio M, CRNA Pre-anesthesia Checklist: Patient identified, Emergency Drugs available, Suction available and Patient being monitored Patient Re-evaluated:Patient Re-evaluated prior to induction Oxygen Delivery Method: Circle System Utilized Preoxygenation: Pre-oxygenation with 100% oxygen Induction Type: IV induction Ventilation: Mask ventilation without difficulty LMA: LMA inserted LMA Size: 4.0 Tube type: Oral Number of attempts: 1 Placement Confirmation: positive ETCO2 Tube secured with: Tape Dental Injury: Teeth and Oropharynx as per pre-operative assessment  Comments: Smooth IV induction Hatchette-- LMA insertion AM CRNA atraumatic-- teeth and mouth as preop-- bilat BS Hatchette

## 2018-04-24 NOTE — Discharge Instructions (Signed)
Botulinum Toxin Bladder Injection, Care After  Refer to this sheet in the next few weeks. These instructions provide you with information about caring for yourself after your procedure. Your health care provider may also give you more specific instructions. Your treatment has been planned according to current medical practices, but problems sometimes occur. Call your health care provider if you have any problems or questions after your procedure.  What can I expect after the procedure?  After the procedure, it is common to have:   Blood-tinged urine.   Burning or soreness when you pass urine.    Follow these instructions at home:     Do not drive for 24 hours if you received a sedative.   Take over-the-counter and prescription medicines only as told by your health care provider.   If you were prescribed an antibiotic medicine, take it as told by your health care provider. Do not stop taking the antibiotic even if you start to feel better.   Drink enough fluid to keep your urine clear or pale yellow.   Return to your normal activities as told by your health care provider. Ask your health care provider what activities are safe for you.   Keep all follow-up visits as told by your health care provider. This is important.  Contact a health care provider if:   You have a fever or chills.   You have blood-tinged urine for more than one day after your procedure.   You have worsening pain or burning when you pass urine.   You have pain or burning when passing urine for more than two days after your procedure.   You have trouble emptying your bladder.  Get help right away if:   You have bright red blood in your urine.   You are unable to pass urine.  This information is not intended to replace advice given to you by your health care provider. Make sure you discuss any questions you have with your health care provider.  Document Released: 01/11/2015 Document Revised: 11/10/2015 Document Reviewed:  10/19/2014  Elsevier Interactive Patient Education  2018 [employer]

## 2018-04-24 NOTE — Transfer of Care (Signed)
Immediate Anesthesia Transfer of Care Note  Patient: Ryan Watkins  Procedure(s) Performed: BOTOX INJECTION WITH CYSTOSCOPY (N/A )  Patient Location: PACU  Anesthesia Type:General  Level of Consciousness: awake, alert , oriented and patient cooperative  Airway & Oxygen Therapy: Patient Spontanous Breathing  Post-op Assessment: Report given to RN and Post -op Vital signs reviewed and stable  Post vital signs: Reviewed and stable  Last Vitals:  Vitals Value Taken Time  BP 123/87 04/24/2018 11:27 AM  Temp    Pulse 67 04/24/2018 11:28 AM  Resp 9 04/24/2018 11:28 AM  SpO2 99 % 04/24/2018 11:28 AM  Vitals shown include unvalidated device data.  Last Pain:  Vitals:   04/24/18 0849  TempSrc:   PainSc: 0-No pain      Patients Stated Pain Goal: 4 (04/24/18 0849)  Complications: No apparent anesthesia complications

## 2018-04-24 NOTE — Anesthesia Preprocedure Evaluation (Addendum)
Anesthesia Evaluation  Patient identified by MRN, date of birth, ID band Patient awake    Reviewed: Allergy & Precautions, H&P , NPO status , Patient's Chart, lab work & pertinent test results  Airway Mallampati: I   Neck ROM: full    Dental no notable dental hx. (+) Teeth Intact   Pulmonary asthma ,    Pulmonary exam normal breath sounds clear to auscultation       Cardiovascular negative cardio ROS Normal cardiovascular exam Rhythm:regular Rate:Normal     Neuro/Psych C4 quadraplegia negative neurological ROS  negative psych ROS   GI/Hepatic negative GI ROS,   Endo/Other  negative endocrine ROS  Renal/GU negative Renal ROS Bladder dysfunction  Neurogenic bladder    Musculoskeletal negative musculoskeletal ROS (+)   Abdominal Normal abdominal exam  (+)   Peds  Hematology negative hematology ROS (+)   Anesthesia Other Findings   Reproductive/Obstetrics                             Anesthesia Physical  Anesthesia Plan  ASA: III  Anesthesia Plan: General   Post-op Pain Management:    Induction: Intravenous  PONV Risk Score and Plan: 2 and Ondansetron and Treatment may vary due to age or medical condition  Airway Management Planned: LMA  Additional Equipment:   Intra-op Plan:   Post-operative Plan:   Informed Consent: I have reviewed the patients History and Physical, chart, labs and discussed the procedure including the risks, benefits and alternatives for the proposed anesthesia with the patient or authorized representative who has indicated his/her understanding and acceptance.     Plan Discussed with: CRNA  Anesthesia Plan Comments:        Anesthesia Quick Evaluation                                  Anesthesia Evaluation  Patient identified by MRN, date of birth, ID band Patient awake    Reviewed: Allergy & Precautions, NPO status , Patient's Chart, lab  work & pertinent test results  Airway Mallampati: II  TM Distance: >3 FB Neck ROM: Full    Dental  (+) Teeth Intact, Dental Advisory Given   Pulmonary asthma ,    Pulmonary exam normal breath sounds clear to auscultation       Cardiovascular Exercise Tolerance: Good negative cardio ROS Normal cardiovascular exam Rhythm:Regular Rate:Normal     Neuro/Psych C4 Quadriplegia negative psych ROS   GI/Hepatic negative GI ROS, Neg liver ROS,   Endo/Other  negative endocrine ROS  Renal/GU negative Renal ROS   Neurogenic bladder    Musculoskeletal negative musculoskeletal ROS (+)   Abdominal   Peds  Hematology negative hematology ROS (+)   Anesthesia Other Findings Day of surgery medications reviewed with the patient.  Reproductive/Obstetrics                             Anesthesia Physical Anesthesia Plan  ASA: III  Anesthesia Plan: General   Post-op Pain Management:    Induction: Intravenous  PONV Risk Score and Plan: 3 and Midazolam, Dexamethasone and Ondansetron  Airway Management Planned: LMA  Additional Equipment:   Intra-op Plan:   Post-operative Plan: Extubation in OR  Informed Consent: I have reviewed the patients History and Physical, chart, labs and discussed the procedure including the risks, benefits and alternatives for  the proposed anesthesia with the patient or authorized representative who has indicated his/her understanding and acceptance.   Dental advisory given  Plan Discussed with: CRNA  Anesthesia Plan Comments: (Risks/benefits of general anesthesia discussed with patient including risk of damage to teeth, lips, gum, and tongue, nausea/vomiting, allergic reactions to medications, and the possibility of heart attack, stroke and death.  All patient questions answered.  Patient wishes to proceed.)        Anesthesia Quick Evaluation                                   Anesthesia Evaluation   Patient identified by MRN, date of birth, ID band Patient awake    Reviewed: Allergy & Precautions, NPO status , Patient's Chart, lab work & pertinent test results  Airway Mallampati: II  TM Distance: >3 FB Neck ROM: Full    Dental  (+) Teeth Intact, Dental Advisory Given   Pulmonary asthma ,    Pulmonary exam normal breath sounds clear to auscultation       Cardiovascular Exercise Tolerance: Good negative cardio ROS Normal cardiovascular exam Rhythm:Regular Rate:Normal     Neuro/Psych C4 Quadriplegia negative psych ROS   GI/Hepatic negative GI ROS, Neg liver ROS,   Endo/Other  negative endocrine ROS  Renal/GU negative Renal ROS   Neurogenic bladder    Musculoskeletal negative musculoskeletal ROS (+)   Abdominal   Peds  Hematology negative hematology ROS (+)   Anesthesia Other Findings Day of surgery medications reviewed with the patient.  Reproductive/Obstetrics                             Anesthesia Physical Anesthesia Plan  ASA: III  Anesthesia Plan: General   Post-op Pain Management:    Induction: Intravenous  PONV Risk Score and Plan: 3 and Midazolam, Dexamethasone and Ondansetron  Airway Management Planned: LMA  Additional Equipment:   Intra-op Plan:   Post-operative Plan: Extubation in OR  Informed Consent: I have reviewed the patients History and Physical, chart, labs and discussed the procedure including the risks, benefits and alternatives for the proposed anesthesia with the patient or authorized representative who has indicated his/her understanding and acceptance.   Dental advisory given  Plan Discussed with: CRNA  Anesthesia Plan Comments: (Risks/benefits of general anesthesia discussed with patient including risk of damage to teeth, lips, gum, and tongue, nausea/vomiting, allergic reactions to medications, and the possibility of heart attack, stroke and death.  All patient questions  answered.  Patient wishes to proceed.)        Anesthesia Quick Evaluation                                   Anesthesia Evaluation  Patient identified by MRN, date of birth, ID band Patient awake    Reviewed: Allergy & Precautions, H&P , NPO status , Patient's Chart, lab work & pertinent test results  Airway Mallampati: II   Neck ROM: full    Dental   Pulmonary asthma ,    breath sounds clear to auscultation       Cardiovascular negative cardio ROS   Rhythm:regular Rate:Normal     Neuro/Psych C4 quadraplegia    GI/Hepatic   Endo/Other    Renal/GU    Neurogenic bladder    Musculoskeletal   Abdominal  Peds  Hematology   Anesthesia Other Findings   Reproductive/Obstetrics                             Anesthesia Physical Anesthesia Plan  ASA: III  Anesthesia Plan: General   Post-op Pain Management:    Induction: Intravenous  PONV Risk Score and Plan: 2 and Ondansetron and Treatment may vary due to age or medical condition  Airway Management Planned: LMA  Additional Equipment:   Intra-op Plan:   Post-operative Plan:   Informed Consent: I have reviewed the patients History and Physical, chart, labs and discussed the procedure including the risks, benefits and alternatives for the proposed anesthesia with the patient or authorized representative who has indicated his/her understanding and acceptance.     Plan Discussed with: CRNA, Anesthesiologist and Surgeon  Anesthesia Plan Comments:         Anesthesia Quick Evaluation

## 2018-04-24 NOTE — Op Note (Signed)
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  He has developed more incontinence and autonomic dysreflexia type symptoms in between catheterization and was brought today for repeat Botox procedure.  Findings: On cystoscopy the urethra was tight as usual, prostate not obstructing, bladder moderately trabeculated but normal mucosa.  Stone debris within the bladder.  Clear reflux bilaterally.  Description of procedure: After consent was obtained patient brought to the operating room.  After adequate anesthesia he was placed in lithotomy position and prepped and draped in the usual sterile fashion.  A timeout was performed to confirm the patient and procedure.  The cystoscope was passed per urethra and the bladder inspected.  Using the injection needle 200 units of Botox was injected in the typical grid fashion 1 cm above the trigone at 20 injection sites (10 units/mL).  Bleeding was minimal.  Hemostasis excellent with the bladder empty.  The scope was withdrawn and he was awakened and taken to the cover room in stable condition.  Complications: None  Blood loss: Minimal  Specimens: None  Drains: None  Disposition: Patient stable to PACU

## 2018-04-25 ENCOUNTER — Encounter (HOSPITAL_COMMUNITY): Payer: Self-pay | Admitting: Urology

## 2018-12-22 ENCOUNTER — Other Ambulatory Visit: Payer: Self-pay | Admitting: Urology

## 2019-01-15 NOTE — Patient Instructions (Addendum)
DUE TO COVID-19 ONLY ONE VISITOR IS ALLOWED TO COME WITH YOU AND STAY IN THE WAITING ROOM ONLY DURING PRE OP AND PROCEDURE DAY OF SURGERY. THE 1 VISITOR MAY VISIT WITH YOU AFTER SURGERY IN YOUR PRIVATE ROOM DURING VISITING HOURS ONLY!  YOU NEED TO HAVE A COVID 19 TEST ON 01-26-19 @ 11:00 AM, THIS TEST MUST BE DONE BEFORE SURGERY, COME  [address], [place]  , [id number].  ([place]) ONCE YOUR COVID TEST IS COMPLETED, PLEASE BEGIN THE QUARANTINE INSTRUCTIONS AS OUTLINED IN YOUR HANDOUT.                [facility]LE DURR  01/15/2019   Your procedure is scheduled on: 01-29-19   Report to [facility] Main  Entrance              Report to short stay  at  0530 AM     Call this number if you have problems the morning of surgery [phone]    Remember: Do not eat food or drink liquids :After Midnight.     Take these medicines the morning of surgery with A SIP OF WATER: None   BRUSH YOUR TEETH MORNING OF SURGERY AND RINSE YOUR MOUTH OUT, NO CHEWING GUM CANDY OR MINTS.                               You may not have any metal on your body including hair pins and              piercings     Do not wear jewelry, lotions, powders or perfumes, deodorant              Men may shave face and neck.   Do not bring valuables to the hospital. [place].  Contacts, dentures or bridgework may not be worn into surgery.       Patients discharged the day of surgery will not be allowed to drive home. IF YOU ARE HAVING SURGERY AND GOING HOME THE SAME DAY, YOU MUST HAVE AN ADULT TO DRIVE YOU HOME AND BE WITH YOU FOR 24 HOURS. YOU MAY GO HOME BY TAXI OR UBER OR ORTHERWISE, BUT AN ADULT MUST ACCOMPANY YOU HOME AND STAY WITH YOU FOR 24 HOURS.    Name and phone number of your driver: Deonte Otting [phone]  Special Instructions: N/A              Please read over the following fact sheets you were  given: _____________________________________________________________________             [facility] - Preparing for Surgery Before surgery, you can play an important role.  Because skin is not sterile, your skin needs to be as free of germs as possible.  You can reduce the number of germs on your skin by washing with CHG (chlorahexidine gluconate) soap before surgery.  CHG is an antiseptic cleaner which kills germs and bonds with the skin to continue killing germs even after washing. Please DO NOT use if you have an allergy to CHG or antibacterial soaps.  If your skin becomes reddened/irritated stop using the CHG and inform your nurse when you arrive at Short Stay. Do not shave (including legs and underarms) for at least 48 hours prior to the first CHG shower.  You  may shave your face/neck. Please follow these instructions carefully:  1.  Shower with CHG Soap the night before surgery and the  morning of Surgery.  2.  If you choose to wash your hair, wash your hair first as usual with your  normal  shampoo.  3.  After you shampoo, rinse your hair and body thoroughly to remove the  shampoo.                           4.  Use CHG as you would any other liquid soap.  You can apply chg directly  to the skin and wash                       Gently with a scrungie or clean washcloth.  5.  Apply the CHG Soap to your body ONLY FROM THE NECK DOWN.   Do not use on face/ open                           Wound or open sores. Avoid contact with eyes, ears mouth and genitals (private parts).                       Wash face,  Genitals (private parts) with your normal soap.             6.  Wash thoroughly, paying special attention to the area where your surgery  will be performed.  7.  Thoroughly rinse your body with warm water from the neck down.  8.  DO NOT shower/wash with your normal soap after using and rinsing off  the CHG Soap.                9.  Pat yourself dry with a clean towel.            10.  Wear  clean pajamas.            11.  Place clean sheets on your bed the night of your first shower and do not  sleep with pets. Day of Surgery : Do not apply any lotions/deodorants the morning of surgery.  Please wear clean clothes to the hospital/surgery center.  FAILURE TO FOLLOW THESE INSTRUCTIONS MAY RESULT IN THE CANCELLATION OF YOUR SURGERY PATIENT SIGNATURE_________________________________  NURSE SIGNATURE__________________________________  ________________________________________________________________________

## 2019-01-21 ENCOUNTER — Encounter (HOSPITAL_COMMUNITY): Payer: Self-pay

## 2019-01-21 ENCOUNTER — Encounter (HOSPITAL_COMMUNITY)
Admission: RE | Admit: 2019-01-21 | Discharge: 2019-01-21 | Disposition: A | Discharge status: Home / Self Care | Payer: Medicare Other | Source: Ambulatory Visit | Attending: Urology | Admitting: Urology

## 2019-01-21 ENCOUNTER — Ambulatory Visit (HOSPITAL_COMMUNITY)
Admission: RE | Admit: 2019-01-21 | Discharge: 2019-01-21 | Disposition: A | Discharge status: Home / Self Care | Payer: Medicare Other | Source: Ambulatory Visit | Attending: Urology | Admitting: Urology

## 2019-01-21 ENCOUNTER — Other Ambulatory Visit: Payer: Self-pay

## 2019-01-21 DIAGNOSIS — N200 Calculus of kidney: Secondary | ICD-10-CM

## 2019-01-21 LAB — BASIC METABOLIC PANEL
Anion gap: 6 (ref 5–15)
BUN: 14 mg/dL (ref 6–20)
CO2: 25 mmol/L (ref 22–32)
Calcium: 9 mg/dL (ref 8.9–10.3)
Chloride: 108 mmol/L (ref 98–111)
Creatinine, Ser: 0.43 mg/dL — ABNORMAL LOW (ref 0.61–1.24)
GFR calc Af Amer: >60 mL/min (ref >60)
GFR calc non Af Amer: >60 mL/min (ref >60)
Glucose, Bld: 95 mg/dL (ref 70–99)
Potassium: 4.1 mmol/L (ref 3.5–5.1)
Sodium: 139 mmol/L (ref 135–145)

## 2019-01-21 LAB — CBC
HCT: 41.3 % (ref 39.0–52.0)
Hemoglobin: 13.6 g/dL (ref 13.0–17.0)
MCH: 28.9 pg (ref 26.0–34.0)
MCHC: 32.9 g/dL (ref 30.0–36.0)
MCV: 87.9 fL (ref 80.0–100.0)
Platelets: 222 10*3/uL (ref 150–400)
RBC: 4.7 MIL/uL (ref 4.22–5.81)
RDW: 14.5 % (ref 11.5–15.5)
WBC: 12.2 10*3/uL — ABNORMAL HIGH (ref 4.0–10.5)
nRBC: 0 % (ref 0.0–0.2)

## 2019-01-26 ENCOUNTER — Other Ambulatory Visit (HOSPITAL_COMMUNITY)
Admission: RE | Admit: 2019-01-26 | Discharge: 2019-01-26 | Disposition: A | Discharge status: Home / Self Care | Payer: Medicare Other | Source: Ambulatory Visit | Attending: Urology | Admitting: Urology

## 2019-01-26 DIAGNOSIS — Z20828 Contact with and (suspected) exposure to other viral communicable diseases: Secondary | ICD-10-CM | POA: Diagnosis not present

## 2019-01-26 DIAGNOSIS — Z01812 Encounter for preprocedural laboratory examination: Secondary | ICD-10-CM | POA: Insufficient documentation

## 2019-01-27 LAB — NOVEL CORONAVIRUS, NAA (HOSP ORDER, SEND-OUT TO REF LAB; TAT 18-24 HRS): SARS-CoV-2, NAA: NOT DETECTED

## 2019-01-28 NOTE — Anesthesia Preprocedure Evaluation (Addendum)
Anesthesia Evaluation  Patient identified by MRN, date of birth, ID band Patient awake    Reviewed: Allergy & Precautions, NPO status , Patient's Chart, lab work & pertinent test results  History of Anesthesia Complications Negative for: history of anesthetic complications  Airway Mallampati: II  TM Distance: >3 FB Neck ROM: Full    Dental   Pulmonary asthma ,    Pulmonary exam normal        Cardiovascular negative cardio ROS Normal cardiovascular exam     Neuro/Psych  Neuromuscular disease (C4 quadriplegia) negative psych ROS   GI/Hepatic negative GI ROS, Neg liver ROS,   Endo/Other  negative endocrine ROS  Renal/GU negative Renal ROS Bladder dysfunction (neurogenic bladder)      Musculoskeletal negative musculoskeletal ROS (+)   Abdominal   Peds  Hematology negative hematology ROS (+)   Anesthesia Other Findings   Reproductive/Obstetrics                             Anesthesia Physical  Anesthesia Plan  ASA: III  Anesthesia Plan: General   Post-op Pain Management:    Induction: Intravenous  PONV Risk Score and Plan: 3 and Ondansetron, Dexamethasone, Treatment may vary due to age or medical condition and Midazolam  Airway Management Planned: LMA  Additional Equipment: None  Intra-op Plan:   Post-operative Plan: Extubation in OR  Informed Consent: I have reviewed the patients History and Physical, chart, labs and discussed the procedure including the risks, benefits and alternatives for the proposed anesthesia with the patient or authorized representative who has indicated his/her understanding and acceptance.       Plan Discussed with:   Anesthesia Plan Comments:         Anesthesia Quick Evaluation  

## 2019-01-29 ENCOUNTER — Ambulatory Visit (HOSPITAL_COMMUNITY): Payer: Medicare Other

## 2019-01-29 ENCOUNTER — Other Ambulatory Visit: Payer: Self-pay

## 2019-01-29 ENCOUNTER — Ambulatory Visit (HOSPITAL_COMMUNITY)
Admission: RE | Admit: 2019-01-29 | Discharge: 2019-01-29 | Disposition: A | Discharge status: Home / Self Care | Payer: Medicare Other | Attending: Urology | Admitting: Urology

## 2019-01-29 ENCOUNTER — Encounter (HOSPITAL_COMMUNITY)
Admission: RE | Disposition: A | Discharge status: Home / Self Care | Payer: Self-pay | Source: Home / Self Care | Attending: Urology

## 2019-01-29 ENCOUNTER — Ambulatory Visit (HOSPITAL_COMMUNITY): Payer: Medicare Other | Admitting: Physician Assistant

## 2019-01-29 ENCOUNTER — Encounter (HOSPITAL_COMMUNITY): Payer: Self-pay | Admitting: *Deleted

## 2019-01-29 DIAGNOSIS — Z87440 Personal history of urinary (tract) infections: Secondary | ICD-10-CM | POA: Insufficient documentation

## 2019-01-29 DIAGNOSIS — S14104S Unspecified injury at C4 level of cervical spinal cord, sequela: Secondary | ICD-10-CM | POA: Diagnosis not present

## 2019-01-29 DIAGNOSIS — J45909 Unspecified asthma, uncomplicated: Secondary | ICD-10-CM | POA: Insufficient documentation

## 2019-01-29 DIAGNOSIS — Z885 Allergy status to narcotic agent status: Secondary | ICD-10-CM | POA: Insufficient documentation

## 2019-01-29 DIAGNOSIS — Z791 Long term (current) use of non-steroidal anti-inflammatories (NSAID): Secondary | ICD-10-CM | POA: Diagnosis not present

## 2019-01-29 DIAGNOSIS — G8250 Quadriplegia, unspecified: Secondary | ICD-10-CM | POA: Insufficient documentation

## 2019-01-29 DIAGNOSIS — G904 Autonomic dysreflexia: Secondary | ICD-10-CM | POA: Insufficient documentation

## 2019-01-29 DIAGNOSIS — Z79899 Other long term (current) drug therapy: Secondary | ICD-10-CM | POA: Diagnosis not present

## 2019-01-29 DIAGNOSIS — Z87442 Personal history of urinary calculi: Secondary | ICD-10-CM | POA: Insufficient documentation

## 2019-01-29 DIAGNOSIS — X58XXXS Exposure to other specified factors, sequela: Secondary | ICD-10-CM | POA: Diagnosis not present

## 2019-01-29 DIAGNOSIS — N318 Other neuromuscular dysfunction of bladder: Secondary | ICD-10-CM | POA: Insufficient documentation

## 2019-01-29 DIAGNOSIS — N200 Calculus of kidney: Secondary | ICD-10-CM | POA: Insufficient documentation

## 2019-01-29 HISTORY — PX: BOTOX INJECTION: SHX5754

## 2019-01-29 HISTORY — PX: URETEROSCOPY WITH HOLMIUM LASER LITHOTRIPSY: SHX6645

## 2019-01-29 SURGERY — BOTOX INJECTION
Anesthesia: General | Laterality: Right

## 2019-01-29 MED ORDER — SODIUM CHLORIDE 0.9 % IR SOLN
Status: DC | PRN
  Administered 2019-01-29: 3000 mL via INTRAVESICAL

## 2019-01-29 MED ORDER — ONDANSETRON HCL 4 MG/2ML IJ SOLN
INTRAMUSCULAR | Status: AC
  Filled 2019-01-29: qty 2

## 2019-01-29 MED ORDER — OXYCODONE HCL 5 MG/5ML PO SOLN: 5.0000 mg | Freq: Once | ORAL | Status: DC | PRN

## 2019-01-29 MED ORDER — PHENYLEPHRINE 40 MCG/ML (10ML) SYRINGE FOR IV PUSH (FOR BLOOD PRESSURE SUPPORT)
PREFILLED_SYRINGE | INTRAVENOUS | Status: DC | PRN
  Administered 2019-01-29: 80 ug via INTRAVENOUS
  Administered 2019-01-29: 40 ug via INTRAVENOUS
  Administered 2019-01-29: 80 ug via INTRAVENOUS
  Administered 2019-01-29: 120 ug via INTRAVENOUS
  Administered 2019-01-29: 80 ug via INTRAVENOUS

## 2019-01-29 MED ORDER — OXYCODONE HCL 5 MG PO TABS: 5.0000 mg | ORAL_TABLET | Freq: Once | ORAL | Status: DC | PRN

## 2019-01-29 MED ORDER — LIDOCAINE 2% (20 MG/ML) 5 ML SYRINGE
INTRAMUSCULAR | Status: DC | PRN
  Administered 2019-01-29: 100 mg via INTRAVENOUS

## 2019-01-29 MED ORDER — SODIUM CHLORIDE (PF) 0.9 % IJ SOLN
INTRAMUSCULAR | Status: AC
  Filled 2019-01-29: qty 10

## 2019-01-29 MED ORDER — LACTATED RINGERS IV SOLN
INTRAVENOUS | Status: DC
  Administered 2019-01-29: 06:00:00 via INTRAVENOUS

## 2019-01-29 MED ORDER — GENTAMICIN SULFATE 40 MG/ML IJ SOLN
360.0000 mg | Freq: Once | INTRAVENOUS | Status: AC
  Administered 2019-01-29: 363.2 mg via INTRAVENOUS
  Filled 2019-01-29: qty 9

## 2019-01-29 MED ORDER — FENTANYL CITRATE (PF) 100 MCG/2ML IJ SOLN: 25.0000 ug | INTRAMUSCULAR | Status: DC | PRN

## 2019-01-29 MED ORDER — FENTANYL CITRATE (PF) 100 MCG/2ML IJ SOLN
INTRAMUSCULAR | Status: AC
  Filled 2019-01-29: qty 2

## 2019-01-29 MED ORDER — PROPOFOL 10 MG/ML IV BOLUS
INTRAVENOUS | Status: DC | PRN
  Administered 2019-01-29: 200 mg via INTRAVENOUS

## 2019-01-29 MED ORDER — LIDOCAINE 2% (20 MG/ML) 5 ML SYRINGE
INTRAMUSCULAR | Status: AC
  Filled 2019-01-29: qty 5

## 2019-01-29 MED ORDER — ONDANSETRON HCL 4 MG/2ML IJ SOLN: 4.0000 mg | Freq: Once | INTRAMUSCULAR | Status: DC | PRN

## 2019-01-29 MED ORDER — SODIUM CHLORIDE (PF) 0.9 % IJ SOLN
INTRAMUSCULAR | Status: DC | PRN
  Administered 2019-01-29: 10 mL via INTRAVENOUS

## 2019-01-29 MED ORDER — PROPOFOL 10 MG/ML IV BOLUS
INTRAVENOUS | Status: AC
  Filled 2019-01-29: qty 20

## 2019-01-29 MED ORDER — PHENYLEPHRINE 40 MCG/ML (10ML) SYRINGE FOR IV PUSH (FOR BLOOD PRESSURE SUPPORT)
PREFILLED_SYRINGE | INTRAVENOUS | Status: AC
  Filled 2019-01-29: qty 10

## 2019-01-29 MED ORDER — LIDOCAINE HCL URETHRAL/MUCOSAL 2 % EX GEL
CUTANEOUS | Status: AC
  Filled 2019-01-29: qty 5

## 2019-01-29 MED ORDER — NITROFURANTOIN MONOHYD MACRO 100 MG PO CAPS: 100.0000 mg | ORAL_CAPSULE | Freq: Every day | ORAL | 0 refills | Status: DC

## 2019-01-29 MED ORDER — BELLADONNA ALKALOIDS-OPIUM 16.2-30 MG RE SUPP
RECTAL | Status: AC
  Filled 2019-01-29: qty 1

## 2019-01-29 MED ORDER — MIDAZOLAM HCL 5 MG/5ML IJ SOLN
INTRAMUSCULAR | Status: DC | PRN
  Administered 2019-01-29: 2 mg via INTRAVENOUS

## 2019-01-29 MED ORDER — IOHEXOL 300 MG/ML  SOLN
INTRAMUSCULAR | Status: DC | PRN
  Administered 2019-01-29: 08:00:00 50 mL

## 2019-01-29 MED ORDER — FENTANYL CITRATE (PF) 100 MCG/2ML IJ SOLN
INTRAMUSCULAR | Status: DC | PRN
  Administered 2019-01-29 (×2): 50 ug via INTRAVENOUS

## 2019-01-29 MED ORDER — ONABOTULINUMTOXINA 100 UNITS IJ SOLR
100.0000 [IU] | Freq: Once | INTRAMUSCULAR | Status: DC
  Filled 2019-01-29: qty 100

## 2019-01-29 MED ORDER — VANCOMYCIN HCL IN DEXTROSE 1-5 GM/200ML-% IV SOLN
1000.0000 mg | Freq: Once | INTRAVENOUS | Status: AC
  Administered 2019-01-29: 06:00:00 1000 mg via INTRAVENOUS

## 2019-01-29 MED ORDER — VANCOMYCIN HCL IN DEXTROSE 1-5 GM/200ML-% IV SOLN
INTRAVENOUS | Status: AC
  Administered 2019-01-29: 1000 mg via INTRAVENOUS
  Filled 2019-01-29: qty 200

## 2019-01-29 MED ORDER — ONDANSETRON HCL 4 MG/2ML IJ SOLN
INTRAMUSCULAR | Status: DC | PRN
  Administered 2019-01-29: 4 mg via INTRAVENOUS

## 2019-01-29 MED ORDER — MIDAZOLAM HCL 2 MG/2ML IJ SOLN
INTRAMUSCULAR | Status: AC
  Filled 2019-01-29: qty 2

## 2019-01-29 SURGICAL SUPPLY — 25 items
BAG URO CATCHER STRL LF (MISCELLANEOUS) ×4 IMPLANT
BASKET ZERO TIP NITINOL 2.4FR (BASKET) ×4 IMPLANT
CATH INTERMIT  6FR 70CM (CATHETERS) ×8 IMPLANT
CATH URET 5FR 28IN CONE TIP (BALLOONS) ×2
CATH URET 5FR 70CM CONE TIP (BALLOONS) ×2 IMPLANT
CLOTH BEACON ORANGE TIMEOUT ST (SAFETY) ×4 IMPLANT
COVER WAND RF STERILE (DRAPES) IMPLANT
GLOVE BIO SURGEON STRL SZ7.5 (GLOVE) ×4 IMPLANT
GLOVE BIOGEL M STRL SZ7.5 (GLOVE) ×4 IMPLANT
GOWN STRL REUS W/TWL LRG LVL3 (GOWN DISPOSABLE) ×4 IMPLANT
GOWN STRL REUS W/TWL XL LVL3 (GOWN DISPOSABLE) ×4 IMPLANT
GUIDEWIRE STR DUAL SENSOR (WIRE) ×4 IMPLANT
KIT TURNOVER KIT A (KITS) ×2 IMPLANT
MANIFOLD NEPTUNE II (INSTRUMENTS) ×4 IMPLANT
NDL SAFETY ECLIPSE 18X1.5 (NEEDLE) IMPLANT
NEEDLE HYPO 18GX1.5 SHARP (NEEDLE) ×2
PACK CYSTO (CUSTOM PROCEDURE TRAY) ×4 IMPLANT
SHEATH URETERAL 12FRX28CM (UROLOGICAL SUPPLIES) ×4 IMPLANT
SHEATH URETERAL 12FRX35CM (MISCELLANEOUS) ×4 IMPLANT
STENT CONTOUR 6FRX26X.038 (STENTS) ×4 IMPLANT
SYR CONTROL 10ML LL (SYRINGE) ×2 IMPLANT
TUBING CONNECTING 10 (TUBING) ×3 IMPLANT
TUBING CONNECTING 10' (TUBING) ×1
WATER STERILE IRR 3000ML UROMA (IV SOLUTION) ×4 IMPLANT
WIRE COONS/BENSON .038X145CM (WIRE) ×4 IMPLANT

## 2019-01-29 NOTE — Transfer of Care (Addendum)
Immediate Anesthesia Transfer of Care Note  Patient: Ryan Watkins  Procedure(s) Performed: BOTOX INJECTION WITH CYSTOSCOPY (N/A ) CYSTOSCOPY, URETEROSCOPY, BILATERAL RETR0GRADE PYLEOGRAM, AND BILATERAL STENT PLACEMENT (Right )  Patient Location: PACU   Anesthesia Type:General  Level of Consciousness: awake, alert  and oriented  Airway & Oxygen Therapy: Patient Spontanous Breathing and Patient connected to face mask oxygen  Post-op Assessment: Report given to RN and Post -op Vital signs reviewed and stable  Post vital signs: Reviewed and stable  Last Vitals:  Vitals Value Taken Time  BP 150/104 01/29/19 0852  Temp 36.8 C 01/29/19 0852  Pulse 71 01/29/19 0854  Resp 12 01/29/19 0854  SpO2 100 % 01/29/19 0854  Vitals shown include unvalidated device data.  Last Pain:  Vitals:   01/29/19 0601  TempSrc: Oral         Complications: No apparent anesthesia complications

## 2019-01-29 NOTE — Anesthesia Procedure Notes (Signed)
Procedure Name: LMA Insertion Date/Time: 01/29/2019 7:38 AM Performed by: [place] Malaisha Silliman C, CRNA Pre-anesthesia Checklist: Patient identified, Emergency Drugs available, Suction available and Patient being monitored Patient Re-evaluated:Patient Re-evaluated prior to induction Oxygen Delivery Method: Circle system utilized Preoxygenation: Pre-oxygenation with 100% oxygen Induction Type: IV induction Ventilation: Mask ventilation without difficulty LMA: LMA inserted LMA Size: 4.0 Number of attempts: 1 Airway Equipment and Method: Bite block Placement Confirmation: positive ETCO2 Tube secured with: Tape Dental Injury: Teeth and Oropharynx as per pre-operative assessment

## 2019-01-29 NOTE — Op Note (Signed)
Preoperative diagnosis: Bilateral renal stones, neurogenic bladder Postoperative diagnosis: Same  Procedure: Cystoscopy with bilateral retrograde pyelogram, bilateral ureteral stent placement, right ureteroscopy, Botox 200 unit injection  Surgeon: Junious Silk  Anesthesia: General  Indication for procedure: Ryan Watkins is a 33 year old quadriplegic who had new stone seen on recent KUB in the right and the left kidney but we thought may have been in the bowel.  He was brought today for retrograde and ureteroscopy on the right with potential on the left if stone was confirmed.  We also plan on continued surveillance if the stones were stable and not causing any obstruction.  He does CIC and began having increased autonomic dysreflexia symptoms as well.  Findings: On cystoscopy the urethra a bit snug in the fossa and has some squamous metaplasia at the membranous urethra but otherwise unremarkable.  Prostate nonobstructing.  Bladder with mild trabeculation and no foreign body.  There was some mild stone debris.  No urothelial lesions.  Right retrograde pyelogram-on scout imaging there were 3 opacities in the region of the right kidney.  On retrograde pyelogram a single ureter single collecting system unit was identified without filling defect, stricture or dilation of the ureter.  There were 3 filling defects confirmed in the kidney and the upper pole a stone in the midpole dropped out into the renal pelvis and a lower pole stone.  Left retrograde pyelogram-on scout imaging there was a stone in the left midpole.  On retrograde injection a single ureter single collecting system unit was visualized without stricture dilation with a filling defect in the midpole consistent with the stone.  Description of procedure: After consent was obtained patient brought to the operating room.  After adequate anesthesia was placed in lithotomy position and prepped and draped in the usual sterile fashion.  A timeout was performed  to confirm the patient and procedure.  Cystoscope was passed per urethra and the bladder inspected.  Scout imaging was obtained.  The right ureteral orifice was cannulated with a 6 [place] open-ended catheter and retrograde injection of contrast was performed.  Similarly the left ureteral orifice was cannulated with a 6 [place] open-ended catheter and left retrograde injection of contrast was performed.  Given that the right stone and dropped into the renal pelvis and was in a position to obstruct the kidney I decided to proceed with ureteroscopy and laser lithotripsy if possible.  A sensor wire was advanced up the right ureter and then I used an access sheath to place a Glidewire and then went adjacent to the Glidewire and left it as the safety wire.  The access sheath only went to the iliacs.  I then passed the single channel digital ureteroscope and it was a bit snug getting over the iliacs but with slow steady light pressure it went over the iliacs and into the proximal ureter but as has been encountered in the past the proximal ureter was tight and began to bunched up around the single channel digital scope.  Therefore I thought it best to leave a stent.  The access sheath was backed out on the ureteroscope and the ureter inspected on the way down and noted to be normal without injury.  The wire was backloaded on the cystoscope and a 626 cm stent advanced.  A good coil was seen in the upper pole calyx and a good coil in the bladder.  Given the fact that he would need to come back for right ureteroscopy I plan to stent the left and the left  Glidewire was advanced up and coiled in the lower pole and then a 626 stent advanced.  The wire was removed with the coil seen in the lower pole and a good coil in the bladder.  The stent fairly quickly passed out of the left ureter and went across the trigone to the right side of the bladder.  On another fluoroscopy image the proximal coil had come out from the lower pole and  was incompletely formed in the renal pelvis therefore I repositioned it.  The stent on the left was grasped with the cystoscope and pulled out through the urethral meatus and the Glidewire readvanced.  This time I steered the Glidewire up into the upper pole calyx and then passed the stent again.  The wire was removed with a good coil seen in the upper pole calyx and a good coil in the bladder.    With the stents in good position, I switched out to the Botox injection scope with the needle and injected 200 units of Botox diluted with 20 cc for 10 cc/mL.  A total of 20 injections were made along the posterior bladder wall above the trigone in the typical grid fashion.  Hemostasis was excellent.  The bladder was drained and the scope removed.  He was awakened and taken to the recovery room in stable condition.  Complications: None  Blood loss: Minimal  Specimens: None  Drains: 6 x 26 cm bilateral ureteral stents  Disposition: Patient stable to PACU-plan to bring him back in 2 to 3 weeks for bilateral ureteroscopy.  I drew a picture of the findings and went over those with the patient's mother.

## 2019-01-29 NOTE — Discharge Instructions (Signed)
Ureteral Stent Implantation, Care After This sheet gives you information about how to care for yourself after your procedure. Your health care provider may also give you more specific instructions. If you have problems or questions, contact your health care provider.  Dr. Estil DaftEskridge's office will call with your next procedure to get the stents and the stones removed  What can I expect after the procedure? After the procedure, it is common to have:  Nausea.  Mild pain when you urinate. You may feel this pain in your lower back or lower abdomen. The pain should stop within a few minutes after you urinate. This may last for up to 1 week.  A small amount of blood in your urine for several days. Follow these instructions at home: Medicines  Take over-the-counter and prescription medicines only as told by your health care provider.  If you were prescribed an antibiotic medicine, take it as told by your health care provider. Do not stop taking the antibiotic even if you start to feel better.  Do not drive for 24 hours if you were given a sedative during your procedure.  Ask your health care provider if the medicine prescribed to you requires you to avoid driving or using heavy machinery. Activity  Rest as told by your health care provider.  Avoid sitting for a long time without moving. Get up to take short walks every 1-2 hours. This is important to improve blood flow and breathing. Ask for help if you feel weak or unsteady.  Return to your normal activities as told by your health care provider. Ask your health care provider what activities are safe for you. General instructions   Watch for any blood in your urine. Call your health care provider if the amount of blood in your urine increases.  If you have a catheter: ? Follow instructions from your health care provider about taking care of your catheter and collection bag. ? Do not take baths, swim, or use a hot tub until your health care  provider approves. Ask your health care provider if you may take showers. You may only be allowed to take sponge baths.  Drink enough fluid to keep your urine pale yellow.  Do not use any products that contain nicotine or tobacco, such as cigarettes, e-cigarettes, and chewing tobacco. These can delay healing after surgery. If you need help quitting, ask your health care provider.  Keep all follow-up visits as told by your health care provider. This is important. Contact a health care provider if:  You have pain that gets worse or does not get better with medicine, especially pain when you urinate.  You have difficulty urinating.  You feel nauseous or you vomit repeatedly during a period of more than 2 days after the procedure. Get help right away if:  Your urine is dark red or has blood clots in it.  You are leaking urine (have incontinence).  The end of the stent comes out of your urethra.  You cannot urinate.  You have sudden, sharp, or severe pain in your abdomen or lower back.  You have a fever.  You have swelling or pain in your legs.  You have difficulty breathing. Summary  After the procedure, it is common to have mild pain when you urinate that goes away within a few minutes after you urinate. This may last for up to 1 week.  Watch for any blood in your urine. Call your health care provider if the amount of blood in  your urine increases.  Take over-the-counter and prescription medicines only as told by your health care provider.  Drink enough fluid to keep your urine pale yellow. This information is not intended to replace advice given to you by your health care provider. Make sure you discuss any questions you have with your health care provider. Document Released: 12/23/2012 Document Revised: 01/27/2018 Document Reviewed: 01/28/2018 Elsevier Patient Education  2020 Elsevier Inc.  OnabotulinumtoxinA injection (Medical Use) What is this  medicine? ONABOTULINUMTOXINA (o na BOTT you lye num tox in eh) is a neuro-muscular blocker. This medicine is used to treat crossed eyes, eyelid spasms, severe neck muscle spasms, ankle and toe muscle spasms, and elbow, wrist, and finger muscle spasms. It is also used to treat excessive underarm sweating, to prevent chronic migraine headaches, and to treat loss of bladder control due to neurologic conditions such as multiple sclerosis or spinal cord injury. This medicine may be used for other purposes; ask your health care provider or pharmacist if you have questions. COMMON BRAND NAME(S): Botox What should I tell my health care provider before I take this medicine? They need to know if you have any of these conditions:  breathing problems  cerebral palsy spasms  difficulty urinating  heart problems  history of surgery where this medicine is going to be used  infection at the site where this medicine is going to be used  myasthenia gravis or other neurologic disease  nerve or muscle disease  surgery plans  take medicines that treat or prevent blood clots  thyroid problems  an unusual or allergic reaction to botulinum toxin, albumin, other medicines, foods, dyes, or preservatives  pregnant or trying to get pregnant  breast-feeding How should I use this medicine? This medicine is for injection into a muscle. It is given by a health care professional in a hospital or clinic setting. Talk to your pediatrician regarding the use of this medicine in children. While this drug may be prescribed for children as young as 13 years old for selected conditions, precautions do apply. Overdosage: If you think you have taken too much of this medicine contact a poison control center or emergency room at once. NOTE: This medicine is only for you. Do not share this medicine with others. What if I miss a dose? This does not apply. What may interact with this medicine?  aminoglycoside antibiotics  like gentamicin, neomycin, tobramycin  muscle relaxants  other botulinum toxin injections This list may not describe all possible interactions. Give your health care provider a list of all the medicines, herbs, non-prescription drugs, or dietary supplements you use. Also tell them if you smoke, drink alcohol, or use illegal drugs. Some items may interact with your medicine. What should I watch for while using this medicine? Visit your doctor for regular check ups. This medicine will cause weakness in the muscle where it is injected. Tell your doctor if you feel unusually weak in other muscles. Get medical help right away if you have problems with breathing, swallowing, or talking. This medicine might make your eyelids droop or make you see blurry or double. If you have weak muscles or trouble seeing do not drive a car, use machinery, or do other dangerous activities. This medicine contains albumin from human blood. It may be possible to pass an infection in this medicine, but no cases have been reported. Talk to your doctor about the risks and benefits of this medicine. If your activities have been limited by your condition, go back  to your regular routine slowly after treatment with this medicine. What side effects may I notice from receiving this medicine? Side effects that you should report to your doctor or health care professional as soon as possible:  allergic reactions like skin rash, itching or hives, swelling of the face, lips, or tongue  breathing problems  changes in vision  chest pain or tightness  eye irritation, pain  fast, irregular heartbeat  infection  numbness  speech problems  swallowing problems  unusual weakness Side effects that usually do not require medical attention (report to your doctor or health care professional if they continue or are bothersome):  bruising or pain at site where injected  drooping eyelid  dry eyes or mouth  headache  muscles  aches, pains  sensitivity to light  tearing This list may not describe all possible side effects. Call your doctor for medical advice about side effects. You may report side effects to FDA at 1-800-FDA-1088. Where should I keep my medicine? This drug is given in a hospital or clinic and will not be stored at home. NOTE: This sheet is a summary. It may not cover all possible information. If you have questions about this medicine, talk to your doctor, pharmacist, or health care provider.  2020 Elsevier/Gold Standard (2017-10-27 14:21:42)   General Anesthesia, Adult, Care After This sheet gives you information about how to care for yourself after your procedure. Your health care provider may also give you more specific instructions. If you have problems or questions, contact your health care provider. What can I expect after the procedure? After the procedure, the following side effects are common:  Pain or discomfort at the IV site.  Nausea.  Vomiting.  Sore throat.  Trouble concentrating.  Feeling cold or chills.  Weak or tired.  Sleepiness and fatigue.  Soreness and body aches. These side effects can affect parts of the body that were not involved in surgery. Follow these instructions at home:  For at least 24 hours after the procedure:  Have a responsible adult stay with you. It is important to have someone help care for you until you are awake and alert.  Rest as needed.  Do not: ? Participate in activities in which you could fall or become injured. ? Drive. ? Use heavy machinery. ? Drink alcohol. ? Take sleeping pills or medicines that cause drowsiness. ? Make important decisions or sign legal documents. ? Take care of children on your own. Eating and drinking  Follow any instructions from your health care provider about eating or drinking restrictions.  When you feel hungry, start by eating small amounts of foods that are soft and easy to digest (bland),  such as toast. Gradually return to your regular diet.  Drink enough fluid to keep your urine pale yellow.  If you vomit, rehydrate by drinking water, juice, or clear broth. General instructions  If you have sleep apnea, surgery and certain medicines can increase your risk for breathing problems. Follow instructions from your health care provider about wearing your sleep device: ? Anytime you are sleeping, including during daytime naps. ? While taking prescription pain medicines, sleeping medicines, or medicines that make you drowsy.  Return to your normal activities as told by your health care provider. Ask your health care provider what activities are safe for you.  Take over-the-counter and prescription medicines only as told by your health care provider.  If you smoke, do not smoke without supervision.  Keep all follow-up visits as told  by your health care provider. This is important. Contact a health care provider if:  You have nausea or vomiting that does not get better with medicine.  You cannot eat or drink without vomiting.  You have pain that does not get better with medicine.  You are unable to pass urine.  You develop a skin rash.  You have a fever.  You have redness around your IV site that gets worse. Get help right away if:  You have difficulty breathing.  You have chest pain.  You have blood in your urine or stool, or you vomit blood. Summary  After the procedure, it is common to have a sore throat or nausea. It is also common to feel tired.  Have a responsible adult stay with you for the first 24 hours after general anesthesia. It is important to have someone help care for you until you are awake and alert.  When you feel hungry, start by eating small amounts of foods that are soft and easy to digest (bland), such as toast. Gradually return to your regular diet.  Drink enough fluid to keep your urine pale yellow.  Return to your normal activities as  told by your health care provider. Ask your health care provider what activities are safe for you. This information is not intended to replace advice given to you by your health care provider. Make sure you discuss any questions you have with your health care provider. Document Released: 07/29/2000 Document Revised: 04/25/2017 Document Reviewed: 12/06/2016 Elsevier Patient Education  2020 [employer].

## 2019-01-29 NOTE — Anesthesia Postprocedure Evaluation (Signed)
Anesthesia Post Note  Patient: DANNIS DEROCHE  Procedure(s) Performed: BOTOX INJECTION WITH CYSTOSCOPY (N/A ) CYSTOSCOPY, URETEROSCOPY, BILATERAL RETR0GRADE PYLEOGRAM, AND BILATERAL STENT PLACEMENT (Right )     Patient location during evaluation: PACU Anesthesia Type: General Level of consciousness: awake and alert Pain management: pain level controlled Vital Signs Assessment: post-procedure vital signs reviewed and stable Respiratory status: spontaneous breathing, nonlabored ventilation and respiratory function stable Cardiovascular status: blood pressure returned to baseline and stable Postop Assessment: no apparent nausea or vomiting Anesthetic complications: no    Last Vitals:  Vitals:   01/29/19 0915 01/29/19 0930  BP: 123/86   Pulse: 65 60  Resp: 12 13  Temp:  36.5 C  SpO2: 98% 98%    Last Pain:  Vitals:   01/29/19 0930  TempSrc:   PainSc: 0-No pain                 Lidia Collum

## 2019-01-29 NOTE — H&P (Signed)
H&P  Chief Complaint: Neurogenic bladder, kidney stones  History of Present Illness: Ryan Watkins is a 33 year old male with a history of C4 injury with resulting quadriplegia.  His family members do CIC.  He has had increased autonomic dysreflexia symptoms and presents for Botox.  He has been taking Augmentin the last few days.  Last urine culture was positive for enterococcus.  He has had no fevers.  He also has a history of kidney stones and ureteroscopy in 2015 and 2019.  KUB looks like a stable right lower pole stone.  There is some opacities on the last KUB in this KUB which may be in the bowel as they are moving around.  I went over again with him the nature risk and benefits of ureteroscopy and stenting and he wants to hold off on that with another follow-up in 6 months which is reasonable.  I reviewed the KUBs with Ryan Watkins and his mom this morning from December 2019 and the current KUB.  We can do a retrograde and fluoroscopy and if the lower pole stone is stable give it another 6 months.  He has a tight proximal ureter.  Past Medical History:  Diagnosis Date  . Asthma   . Neurogenic bladder    caths 4 x day  . Pneumonia 2009  . Quadriplegia (HCC) 08-2003   C 4  . Sinus infection few weeks ago, off all antibiotics   lungs clear no cough  . UTI (urinary tract infection)    completed Cipro 07/18/11   Past Surgical History:  Procedure Laterality Date  . bladder surgery to remove kidney stones  2006  . BOTOX INJECTION N/A 06/29/2013   Procedure: BOTOX INJECTION;  Surgeon: [name], MD;  Location: [facility];  Service: Urology;  Laterality: N/A;  . BOTOX INJECTION N/A 09/10/2016   Procedure: BOTOX INJECTION;  Surgeon: [name], MD;  Location: [facility];  Service: Urology;  Laterality: N/A;  . BOTOX INJECTION N/A 04/24/2018   Procedure: BOTOX INJECTION WITH CYSTOSCOPY;  Surgeon: [name], MD;  Location: [facility];  Service: Urology;  Laterality: N/A;  . cervical spinal  surgery C 4, C5 and C6  08-2003  . CYSTOSCOPY  02/18/2012   Procedure: CYSTOSCOPY;  Surgeon: [name], MD;  Location: [facility];  Service: Urology;  Laterality: N/A;  . CYSTOSCOPY N/A 08/07/2012   Procedure: CYSTOSCOPY WITH BOTOX INJECTION ;  Surgeon: [name], MD;  Location: [facility];  Service: Urology;  Laterality: N/A;  . CYSTOSCOPY N/A 09/10/2016   Procedure: CYSTOSCOPY WITH BOTOX INJECTION;  Surgeon: [name], MD;  Location: [facility];  Service: Urology;  Laterality: N/A;  . CYSTOSCOPY WITH INJECTION  07/23/2011   Procedure: CYSTOSCOPY WITH INJECTION;  Surgeon: [name], MD;  Location: [facility];  Service: Urology;  Laterality: N/A;  . CYSTOSCOPY WITH INJECTION N/A 03/29/2014   Procedure: CYSTOSCOPY WITH BOTOX INJECTION;  Surgeon: [name], MD;  Location: [facility];  Service: Urology;  Laterality: N/A;  . CYSTOSCOPY WITH INJECTION N/A 12/13/2014   Procedure: CYSTOSCOPY WITH BOTOX;  Surgeon: [name], MD;  Location: [facility];  Service: Urology;  Laterality: N/A;  . CYSTOSCOPY WITH INJECTION N/A 06/16/2015   Procedure: CYSTOSCOPY WITH BOTOX INJECTION;  Surgeon: [name], MD;  Location: [facility];  Service: Urology;  Laterality: N/A;  . CYSTOSCOPY WITH INJECTION N/A 06/10/2017   Procedure: CYSTOSCOPY WITH INJECTION/ BOTOX;  Surgeon: [name], MD;  Location: [facility];  Service: Urology;  Laterality: N/A;  .  CYSTOSCOPY WITH RETROGRADE PYELOGRAM, URETEROSCOPY AND STENT PLACEMENT Right 07/13/2013   Procedure: CYSTOSCOPY RIGHT URETEROSCOPY/LASER LITHROSCOPY [name]/STONE BASKET EXTRACTION STENT PLACEMENT;  Surgeon: Antony HasteMatthew Ramsey Jericho Alcorn, MD;  Location: [facility];  Service: Urology;  Laterality: Right;  . CYSTOSCOPY WITH URETEROSCOPY AND STENT PLACEMENT Right 06/29/2013   Procedure: CYSTOSCOPY WITH RETROGRADE, URETEROSCOPY AND STENT PLACEMENT ON RIGHT;  Surgeon: Antony HasteMatthew Ramsey Braysen Cloward, MD;  Location: [facility];  Service: Urology;  Laterality: Right;  .  CYSTOSCOPY/URETEROSCOPY/HOLMIUM LASER/STENT PLACEMENT Right 06/10/2017   Procedure: CYSTOSCOPY/RETROGRADE/URETEROSCOPY/STENT PLACEMENT;  Surgeon: Jerilee FieldEskridge, Kasim Mccorkle, MD;  Location: [facility];  Service: Urology;  Laterality: Right;  . CYSTOSCOPY/URETEROSCOPY/HOLMIUM LASER/STENT PLACEMENT Right 07/01/2017   Procedure: CYSTOSCOPY/URETEROSCOPY/HOLMIUM LASER/STENT PLACEMENT;  Surgeon: Jerilee FieldEskridge, Jacqui Headen, MD;  Location: [facility];  Service: Urology;  Laterality: Right;  . duodenal surgery  feb 2005  . HOLMIUM LASER APPLICATION Right 07/13/2013   Procedure: RIGHT LASER LITHOTRIPSY;  Surgeon: Antony HasteMatthew Ramsey Shaaron Golliday, MD;  Location: [facility];  Service: Urology;  Laterality: Right;  . sacral skin flap surgery  june 2005   on sacrum  . 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 vena cana filter placement  08-2003   right groin  . WISDOM TOOTH EXTRACTION  2009    Home Medications:  Medications Prior to Admission  Medication Sig Dispense Refill Last Dose  . fluticasone (FLONASE) 50 MCG/ACT nasal spray Place 1 spray into both nostrils daily.    Past Week at Unknown time  . ibuprofen (ADVIL,MOTRIN) 200 MG tablet Take 400 mg by mouth every 8 (eight) hours as needed for fever, headache or moderate pain.    Past Week at Unknown time  . nitrofurantoin, macrocrystal-monohydrate, (MACROBID) 100 MG capsule Take 100 mg by mouth 2 (two) times daily.   01/28/2019 at Unknown time  . polyethylene glycol (MIRALAX / GLYCOLAX) packet Take 17 g by mouth daily as needed for mild constipation.   01/28/2019 at Unknown time  . sildenafil (REVATIO) 20 MG tablet Take 20-100 mg by mouth daily as needed for erectile dysfunction.   Past Week at Unknown time   Allergies:  Allergies  Allergen Reactions  . Morphine And Related Other (See Comments)    hallucinations    History reviewed. No pertinent family history. Social History:  reports that he has never smoked. He has never used smokeless tobacco. He reports that  he does not drink alcohol or use drugs.  ROS: A complete review of systems was performed.  All systems are negative except for pertinent findings as noted. Review of Systems  All other systems reviewed and are negative.    Physical Exam:  Vital signs in last 24 hours: Temp:  [98.2 F (36.8 C)] 98.2 F (36.8 C) (09/25 0601) Pulse Rate:  [75] 75 (09/25 0601) Resp:  [18] 18 (09/25 0601) BP: (113)/(76) 113/76 (09/25 0601) SpO2:  [97 %] 97 % (09/25 0601) Weight:  [72.6 kg] 72.6 kg (09/25 0600) General:  Alert and oriented, No acute distress HEENT: Normocephalic, atraumatic Cardiovascular: Regular rate and rhythm Lungs: Regular rate and effort Abdomen: Soft, nontender, nondistended, no abdominal masses Back: No CVA tenderness Extremities: No edema Neurologic: Grossly intact  Laboratory Data:  No results found for this or any previous visit (from the past 24 hour(s)). Recent Results (from the past 240 hour(s))  Novel Coronavirus, NAA (Hosp order, Send-out to Ref Lab; TAT 18-24 hrs     Status: None   Collection Time: 01/26/19 10:54 AM   Specimen: Nasopharyngeal  Swab; Respiratory  Result Value Ref Range Status   SARS-CoV-2, NAA NOT DETECTED NOT DETECTED Final    Comment: (NOTE) This nucleic acid amplification test was developed and its performance characteristics determined by [employer]. Nucleic acid amplification tests include PCR and TMA. This test has not been FDA cleared or approved. This test has been authorized by FDA under an Emergency Use Authorization (EUA). This test is only authorized for the duration of time the declaration that circumstances exist justifying the authorization of the emergency use of in vitro diagnostic tests for detection of SARS-CoV-2 virus and/or diagnosis of COVID-19 infection under section 564(b)(1) of the Act, 21 U.S.C. 672CNO-7(S) (1), unless the authorization is terminated or revoked sooner. When diagnostic testing is negative,  the possibility of a false negative result should be considered in the context of a patient's recent exposures and the presence of clinical signs and symptoms consistent with COVID-19. An individual without symptoms of COVID- 19 and who is not shedding SARS-CoV-2 vi rus would expect to have a negative (not detected) result in this assay. Performed At: [facility] [address] [place], [place] [id number] Rush Farmer MD UT:[id number]    [place]  Final    Comment: Performed at [facility], [place] [address] [place], [place] [address]   Creatinine: No results for input(s): CREATININE in the last 168 hours.  Impression/Assessment:  Neurogenic bladder, kidney stones-  Plan:  I discussed with the patient and his mom the nature, potential benefits, risks and alternatives to cystoscopy, botox, right RGP, including side effects of the proposed treatment, the likelihood of the patient achieving the goals of the procedure, and any potential problems that might occur during the procedure or recuperation. Again if kidney stone relatively stable we will give it another 6 months with a f/u KUB in 6 months when we will need to plan the next botox anyway. He likes this plan.  All questions answered. Patient elects to proceed.    [name] 01/29/2019, 7:28 AM

## 2019-01-30 ENCOUNTER — Encounter (HOSPITAL_COMMUNITY): Payer: Self-pay | Admitting: Urology

## 2019-02-08 ENCOUNTER — Other Ambulatory Visit: Payer: Self-pay | Admitting: Urology

## 2019-02-18 NOTE — Progress Notes (Signed)
PCP - Kip Corrington,  Cardiologist -   Chest x-ray -  EKG -  Stress Test -  ECHO -  Cardiac Cath -   Sleep Study -  CPAP -   Fasting Blood Sugar -  Checks Blood Sugar _____ times a day  Blood Thinner Instructions: Aspirin Instructions: Last Dose:  Anesthesia review:   Patient denies shortness of breath, fever, cough and chest pain at PAT appointment   Patient verbalized understanding of instructions that were given to them at the PAT appointment. Patient was also instructed that they will need to review over the PAT instructions again at home before surgery.

## 2019-02-18 NOTE — Patient Instructions (Addendum)
DUE TO COVID-19 ONLY ONE VISITOR IS ALLOWED TO COME WITH YOU AND STAY IN THE WAITING ROOM ONLY DURING PRE OP AND PROCEDURE DAY OF SURGERY. THE 1 VISITOR MAY VISIT WITH YOU AFTER SURGERY IN YOUR PRIVATE ROOM DURING VISITING HOURS ONLY!  YOU NEED TO HAVE A COVID 19 TEST ON_______ @_______ , THIS TEST MUST BE DONE BEFORE SURGERY, COME  [place], [place] , [address].  ([place]) ONCE YOUR COVID TEST IS COMPLETED, PLEASE BEGIN THE QUARANTINE INSTRUCTIONS AS OUTLINED IN YOUR HANDOUT.                Ryan Watkins  02/18/2019   Your procedure is scheduled on: 03-02-19    Report to [facility] Main  Entrance    Report to Short at 5:30  AM     Call this number if you have problems the morning of surgery [phone]    Remember: Do not eat food or drink liquids :After Midnight.      Take these medicines the morning of surgery with A SIP OF WATER: None   BRUSH YOUR TEETH MORNING OF SURGERY AND RINSE YOUR MOUTH OUT, NO CHEWING GUM CANDY OR MINTS.                                 You may not have any metal on your body including hair pins and              piercings     Do not wear jewelry, make-up, lotions, powders or perfumes, deodorant              Men may shave face and neck.   Do not bring valuables to the hospital. [place].  Contacts, dentures or bridgework may not be worn into surgery.    Patients discharged the day of surgery will not be allowed to drive home. IF YOU ARE HAVING SURGERY AND GOING HOME THE SAME DAY, YOU MUST HAVE AN ADULT TO DRIVE YOU HOME AND BE WITH YOU FOR 24 HOURS. YOU MAY GO HOME BY TAXI OR UBER OR ORTHERWISE, BUT AN ADULT MUST ACCOMPANY YOU HOME AND STAY WITH YOU FOR 24 HOURS.  Name and phone number of your driver: Haddon Fyfe [phone]  Special Instructions: N/A              Please read over the following fact sheets you were  given: _____________________________________________________________________             [facility] - Preparing for Surgery Before surgery, you can play an important role.  Because skin is not sterile, your skin needs to be as free of germs as possible.  You can reduce the number of germs on your skin by washing with CHG (chlorahexidine gluconate) soap before surgery.  CHG is an antiseptic cleaner which kills germs and bonds with the skin to continue killing germs even after washing. Please DO NOT use if you have an allergy to CHG or antibacterial soaps.  If your skin becomes reddened/irritated stop using the CHG and inform your nurse when you arrive at Short Stay. Do not shave (including legs and underarms) for at least 48 hours prior to the first CHG shower.  You may shave your face/neck. Please follow these instructions carefully:  1.  Shower with CHG  Soap the night before surgery and the  morning of Surgery.  2.  If you choose to wash your hair, wash your hair first as usual with your  normal  shampoo.  3.  After you shampoo, rinse your hair and body thoroughly to remove the  shampoo.                           4.  Use CHG as you would any other liquid soap.  You can apply chg directly  to the skin and wash                       Gently with a scrungie or clean washcloth.  5.  Apply the CHG Soap to your body ONLY FROM THE NECK DOWN.   Do not use on face/ open                           Wound or open sores. Avoid contact with eyes, ears mouth and genitals (private parts).                       Wash face,  Genitals (private parts) with your normal soap.             6.  Wash thoroughly, paying special attention to the area where your surgery  will be performed.  7.  Thoroughly rinse your body with warm water from the neck down.  8.  DO NOT shower/wash with your normal soap after using and rinsing off  the CHG Soap.                9.  Pat yourself dry with a clean towel.            10.  Wear  clean pajamas.            11.  Place clean sheets on your bed the night of your first shower and do not  sleep with pets. Day of Surgery : Do not apply any lotions/deodorants the morning of surgery.  Please wear clean clothes to the hospital/surgery center.  FAILURE TO FOLLOW THESE INSTRUCTIONS MAY RESULT IN THE CANCELLATION OF YOUR SURGERY PATIENT SIGNATURE_________________________________  NURSE SIGNATURE__________________________________  ________________________________________________________________________

## 2019-02-19 ENCOUNTER — Other Ambulatory Visit: Payer: Self-pay

## 2019-02-19 ENCOUNTER — Encounter (HOSPITAL_COMMUNITY): Payer: Self-pay

## 2019-02-19 ENCOUNTER — Encounter (HOSPITAL_COMMUNITY)
Admission: RE | Admit: 2019-02-19 | Discharge: 2019-02-19 | Disposition: A | Discharge status: Home / Self Care | Payer: Medicare Other | Source: Ambulatory Visit | Attending: Urology | Admitting: Urology

## 2019-02-19 DIAGNOSIS — Z01812 Encounter for preprocedural laboratory examination: Secondary | ICD-10-CM | POA: Diagnosis not present

## 2019-02-19 DIAGNOSIS — N200 Calculus of kidney: Secondary | ICD-10-CM | POA: Diagnosis not present

## 2019-02-19 LAB — CBC
HCT: 38.2 % — ABNORMAL LOW (ref 39.0–52.0)
Hemoglobin: 12.9 g/dL — ABNORMAL LOW (ref 13.0–17.0)
MCH: 28.9 pg (ref 26.0–34.0)
MCHC: 33.8 g/dL (ref 30.0–36.0)
MCV: 85.7 fL (ref 80.0–100.0)
Platelets: 255 10*3/uL (ref 150–400)
RBC: 4.46 MIL/uL (ref 4.22–5.81)
RDW: 13.3 % (ref 11.5–15.5)
WBC: 8.1 10*3/uL (ref 4.0–10.5)
nRBC: 0 % (ref 0.0–0.2)

## 2019-02-19 LAB — BASIC METABOLIC PANEL
Anion gap: 11 (ref 5–15)
BUN: 16 mg/dL (ref 6–20)
CO2: 22 mmol/L (ref 22–32)
Calcium: 8.9 mg/dL (ref 8.9–10.3)
Chloride: 106 mmol/L (ref 98–111)
Creatinine, Ser: 0.55 mg/dL — ABNORMAL LOW (ref 0.61–1.24)
GFR calc Af Amer: >60 mL/min (ref >60)
GFR calc non Af Amer: >60 mL/min (ref >60)
Glucose, Bld: 142 mg/dL — ABNORMAL HIGH (ref 70–99)
Potassium: 3.7 mmol/L (ref 3.5–5.1)
Sodium: 139 mmol/L (ref 135–145)

## 2019-02-26 ENCOUNTER — Other Ambulatory Visit (HOSPITAL_COMMUNITY)
Admission: RE | Admit: 2019-02-26 | Discharge: 2019-02-26 | Disposition: A | Discharge status: Home / Self Care | Payer: Medicare Other | Source: Ambulatory Visit | Attending: Urology | Admitting: Urology

## 2019-02-26 DIAGNOSIS — Z20828 Contact with and (suspected) exposure to other viral communicable diseases: Secondary | ICD-10-CM | POA: Insufficient documentation

## 2019-02-26 DIAGNOSIS — Z01812 Encounter for preprocedural laboratory examination: Secondary | ICD-10-CM | POA: Insufficient documentation

## 2019-02-27 LAB — NOVEL CORONAVIRUS, NAA (HOSP ORDER, SEND-OUT TO REF LAB; TAT 18-24 HRS): SARS-CoV-2, NAA: NOT DETECTED

## 2019-03-01 MED ORDER — GENTAMICIN SULFATE 40 MG/ML IJ SOLN
5.0000 mg/kg | Freq: Once | INTRAVENOUS | Status: AC
  Administered 2019-03-02: 08:00:00 363.2 mg via INTRAVENOUS
  Filled 2019-03-01: qty 9

## 2019-03-02 ENCOUNTER — Ambulatory Visit (HOSPITAL_COMMUNITY): Payer: Medicare Other

## 2019-03-02 ENCOUNTER — Ambulatory Visit (HOSPITAL_COMMUNITY): Payer: Medicare Other | Admitting: Physician Assistant

## 2019-03-02 ENCOUNTER — Encounter (HOSPITAL_COMMUNITY)
Admission: RE | Disposition: A | Discharge status: Home / Self Care | Payer: Self-pay | Source: Other Acute Inpatient Hospital | Attending: Urology

## 2019-03-02 ENCOUNTER — Encounter (HOSPITAL_COMMUNITY): Payer: Self-pay

## 2019-03-02 ENCOUNTER — Ambulatory Visit (HOSPITAL_COMMUNITY)
Admission: RE | Admit: 2019-03-02 | Discharge: 2019-03-02 | Disposition: A | Discharge status: Home / Self Care | Payer: Medicare Other | Source: Other Acute Inpatient Hospital | Attending: Urology | Admitting: Urology

## 2019-03-02 ENCOUNTER — Ambulatory Visit (HOSPITAL_COMMUNITY): Payer: Medicare Other | Admitting: Certified Registered"

## 2019-03-02 DIAGNOSIS — N319 Neuromuscular dysfunction of bladder, unspecified: Secondary | ICD-10-CM | POA: Insufficient documentation

## 2019-03-02 DIAGNOSIS — Z20828 Contact with and (suspected) exposure to other viral communicable diseases: Secondary | ICD-10-CM | POA: Diagnosis not present

## 2019-03-02 DIAGNOSIS — J45909 Unspecified asthma, uncomplicated: Secondary | ICD-10-CM | POA: Diagnosis not present

## 2019-03-02 DIAGNOSIS — G709 Myoneural disorder, unspecified: Secondary | ICD-10-CM | POA: Diagnosis not present

## 2019-03-02 DIAGNOSIS — N200 Calculus of kidney: Secondary | ICD-10-CM | POA: Diagnosis present

## 2019-03-02 DIAGNOSIS — G8250 Quadriplegia, unspecified: Secondary | ICD-10-CM | POA: Insufficient documentation

## 2019-03-02 DIAGNOSIS — Z79899 Other long term (current) drug therapy: Secondary | ICD-10-CM | POA: Diagnosis not present

## 2019-03-02 DIAGNOSIS — Z7951 Long term (current) use of inhaled steroids: Secondary | ICD-10-CM | POA: Insufficient documentation

## 2019-03-02 HISTORY — PX: URETEROSCOPY WITH HOLMIUM LASER LITHOTRIPSY: SHX6645

## 2019-03-02 SURGERY — URETEROSCOPY, WITH LITHOTRIPSY USING HOLMIUM LASER
Anesthesia: General | Laterality: Bilateral

## 2019-03-02 MED ORDER — PROMETHAZINE HCL 25 MG/ML IJ SOLN: 6.2500 mg | INTRAMUSCULAR | Status: DC | PRN

## 2019-03-02 MED ORDER — PHENYLEPHRINE HCL (PRESSORS) 10 MG/ML IV SOLN
INTRAVENOUS | Status: AC
  Filled 2019-03-02: qty 1

## 2019-03-02 MED ORDER — GLYCOPYRROLATE PF 0.2 MG/ML IJ SOSY
PREFILLED_SYRINGE | INTRAMUSCULAR | Status: AC
  Filled 2019-03-02: qty 1

## 2019-03-02 MED ORDER — VANCOMYCIN HCL IN DEXTROSE 1-5 GM/200ML-% IV SOLN
1000.0000 mg | Freq: Once | INTRAVENOUS | Status: AC
  Administered 2019-03-02 (×2): 1000 mg via INTRAVENOUS
  Filled 2019-03-02: qty 200

## 2019-03-02 MED ORDER — NITROFURANTOIN MONOHYD MACRO 100 MG PO CAPS: 100.0000 mg | ORAL_CAPSULE | Freq: Every day | ORAL | 0 refills | Status: DC

## 2019-03-02 MED ORDER — LACTATED RINGERS IV SOLN
INTRAVENOUS | Status: DC
  Administered 2019-03-02: 06:00:00 via INTRAVENOUS

## 2019-03-02 MED ORDER — EPHEDRINE 5 MG/ML INJ
INTRAVENOUS | Status: AC
  Filled 2019-03-02: qty 10

## 2019-03-02 MED ORDER — ONDANSETRON HCL 4 MG/2ML IJ SOLN
INTRAMUSCULAR | Status: DC | PRN
  Administered 2019-03-02: 4 mg via INTRAVENOUS

## 2019-03-02 MED ORDER — MIDAZOLAM HCL 5 MG/5ML IJ SOLN
INTRAMUSCULAR | Status: DC | PRN
  Administered 2019-03-02: 2 mg via INTRAVENOUS

## 2019-03-02 MED ORDER — PHENYLEPHRINE 40 MCG/ML (10ML) SYRINGE FOR IV PUSH (FOR BLOOD PRESSURE SUPPORT)
PREFILLED_SYRINGE | INTRAVENOUS | Status: AC
  Filled 2019-03-02: qty 10

## 2019-03-02 MED ORDER — FENTANYL CITRATE (PF) 100 MCG/2ML IJ SOLN
INTRAMUSCULAR | Status: AC
  Filled 2019-03-02: qty 2

## 2019-03-02 MED ORDER — DEXAMETHASONE SODIUM PHOSPHATE 10 MG/ML IJ SOLN
INTRAMUSCULAR | Status: DC | PRN
  Administered 2019-03-02: 10 mg via INTRAVENOUS

## 2019-03-02 MED ORDER — FENTANYL CITRATE (PF) 100 MCG/2ML IJ SOLN: 25.0000 ug | INTRAMUSCULAR | Status: DC | PRN

## 2019-03-02 MED ORDER — LIDOCAINE 2% (20 MG/ML) 5 ML SYRINGE
INTRAMUSCULAR | Status: DC | PRN
  Administered 2019-03-02: 60 mg via INTRAVENOUS

## 2019-03-02 MED ORDER — PHENYLEPHRINE 40 MCG/ML (10ML) SYRINGE FOR IV PUSH (FOR BLOOD PRESSURE SUPPORT): PREFILLED_SYRINGE | INTRAVENOUS | Status: DC | PRN

## 2019-03-02 MED ORDER — PROPOFOL 10 MG/ML IV BOLUS
INTRAVENOUS | Status: DC | PRN
  Administered 2019-03-02: 150 mg via INTRAVENOUS

## 2019-03-02 MED ORDER — GLYCOPYRROLATE PF 0.2 MG/ML IJ SOSY
PREFILLED_SYRINGE | INTRAMUSCULAR | Status: DC | PRN
  Administered 2019-03-02: .2 mg via INTRAVENOUS

## 2019-03-02 MED ORDER — IOHEXOL 300 MG/ML  SOLN
INTRAMUSCULAR | Status: DC | PRN
  Administered 2019-03-02: 10:00:00 3 mL via URETHRAL

## 2019-03-02 MED ORDER — FENTANYL CITRATE (PF) 100 MCG/2ML IJ SOLN
INTRAMUSCULAR | Status: DC | PRN
  Administered 2019-03-02 (×2): 25 ug via INTRAVENOUS
  Administered 2019-03-02: 50 ug via INTRAVENOUS

## 2019-03-02 MED ORDER — ONDANSETRON HCL 4 MG/2ML IJ SOLN
INTRAMUSCULAR | Status: AC
  Filled 2019-03-02: qty 4

## 2019-03-02 MED ORDER — PROPOFOL 10 MG/ML IV BOLUS
INTRAVENOUS | Status: AC
  Filled 2019-03-02: qty 40

## 2019-03-02 MED ORDER — MIDAZOLAM HCL 2 MG/2ML IJ SOLN
INTRAMUSCULAR | Status: AC
  Filled 2019-03-02: qty 2

## 2019-03-02 MED ORDER — SODIUM CHLORIDE 0.9 % IR SOLN
Status: DC | PRN
  Administered 2019-03-02: 3000 mL via INTRAVESICAL

## 2019-03-02 MED ORDER — LIDOCAINE 2% (20 MG/ML) 5 ML SYRINGE
INTRAMUSCULAR | Status: AC
  Filled 2019-03-02: qty 5

## 2019-03-02 MED ORDER — DEXAMETHASONE SODIUM PHOSPHATE 10 MG/ML IJ SOLN
INTRAMUSCULAR | Status: AC
  Filled 2019-03-02: qty 2

## 2019-03-02 MED ORDER — 0.9 % SODIUM CHLORIDE (POUR BTL) OPTIME
TOPICAL | Status: DC | PRN
  Administered 2019-03-02: 1000 mL

## 2019-03-02 MED ORDER — SUCCINYLCHOLINE CHLORIDE 200 MG/10ML IV SOSY
PREFILLED_SYRINGE | INTRAVENOUS | Status: AC
  Filled 2019-03-02: qty 10

## 2019-03-02 MED ORDER — PROPOFOL 10 MG/ML IV BOLUS
INTRAVENOUS | Status: AC
  Filled 2019-03-02: qty 20

## 2019-03-02 MED ORDER — PHENYLEPHRINE 40 MCG/ML (10ML) SYRINGE FOR IV PUSH (FOR BLOOD PRESSURE SUPPORT)
PREFILLED_SYRINGE | INTRAVENOUS | Status: DC | PRN
  Administered 2019-03-02: 80 ug via INTRAVENOUS
  Administered 2019-03-02: 100 ug via INTRAVENOUS
  Administered 2019-03-02 (×2): 80 ug via INTRAVENOUS
  Administered 2019-03-02: 160 ug via INTRAVENOUS
  Administered 2019-03-02 (×2): 80 ug via INTRAVENOUS
  Administered 2019-03-02: 60 ug via INTRAVENOUS
  Administered 2019-03-02 (×2): 120 ug via INTRAVENOUS

## 2019-03-02 MED ORDER — ESMOLOL HCL 100 MG/10ML IV SOLN
INTRAVENOUS | Status: AC
  Filled 2019-03-02: qty 10

## 2019-03-02 SURGICAL SUPPLY — 28 items
BAG URINE DRAINAGE (UROLOGICAL SUPPLIES) ×2 IMPLANT
BAG URO CATCHER STRL LF (MISCELLANEOUS) ×3 IMPLANT
BASKET ZERO TIP NITINOL 2.4FR (BASKET) ×3 IMPLANT
CATH FOLEY 2WAY SLVR  5CC 16FR (CATHETERS) ×2
CATH FOLEY 2WAY SLVR 5CC 16FR (CATHETERS) IMPLANT
CATH INTERMIT  6FR 70CM (CATHETERS) ×4 IMPLANT
CATH URET 5FR 28IN CONE TIP (BALLOONS)
CATH URET 5FR 70CM CONE TIP (BALLOONS) ×1 IMPLANT
CLOTH BEACON ORANGE TIMEOUT ST (SAFETY) ×1 IMPLANT
FIBER LASER TRAC TIP (UROLOGICAL SUPPLIES) ×2 IMPLANT
GLOVE BIO SURGEON STRL SZ7.5 (GLOVE) ×3 IMPLANT
GOWN STRL REUS W/TWL XL LVL3 (GOWN DISPOSABLE) ×3 IMPLANT
GUIDEWIRE ANG ZIPWIRE 038X150 (WIRE) ×4 IMPLANT
GUIDEWIRE STR DUAL SENSOR (WIRE) ×3 IMPLANT
HOLDER FOLEY CATH W/STRAP (MISCELLANEOUS) ×2 IMPLANT
KIT TURNOVER KIT A (KITS) ×2 IMPLANT
MANIFOLD NEPTUNE II (INSTRUMENTS) ×3 IMPLANT
PACK CYSTO (CUSTOM PROCEDURE TRAY) ×3 IMPLANT
SHEATH URETERAL 12FRX28CM (UROLOGICAL SUPPLIES) ×1 IMPLANT
SHEATH URETERAL 12FRX35CM (MISCELLANEOUS) ×3 IMPLANT
SHEATH URETERAL 12FRX55CM (UROLOGICAL SUPPLIES) ×2 IMPLANT
STENT CONTOUR 6FRX26X.038 (STENTS) ×4 IMPLANT
SYR 10ML LL (SYRINGE) ×2 IMPLANT
TUBING CONNECTING 10 (TUBING) ×2 IMPLANT
TUBING CONNECTING 10' (TUBING) ×1
TUBING UROLOGY SET (TUBING) ×3 IMPLANT
WATER STERILE IRR 500ML POUR (IV SOLUTION) ×2 IMPLANT
WIRE COONS/BENSON .038X145CM (WIRE) ×1 IMPLANT

## 2019-03-02 NOTE — Transfer of Care (Signed)
Immediate Anesthesia Transfer of Care Note  Patient: Ryan Watkins  Procedure(s) Performed: CYSTOSCOPY/URETEROSCOPY WITH HOLMIUM LASER LITHOTRIPSY/ RETROGRADE PYELOGRAM/ STENT EXCHANGE (Bilateral )  Patient Location: PACU  Anesthesia Type:General  Level of Consciousness: drowsy, patient cooperative and responds to stimulation  Airway & Oxygen Therapy: Patient Spontanous Breathing and Patient connected to face mask oxygen  Post-op Assessment: Report given to RN and Post -op Vital signs reviewed and stable  Post vital signs: Reviewed and stable  Last Vitals:  Vitals Value Taken Time  BP 134/99 03/02/19 1005  Temp 37.1 C 03/02/19 1005  Pulse 92 03/02/19 1006  Resp 11 03/02/19 1006  SpO2 100 % 03/02/19 1006  Vitals shown include unvalidated device data.  Last Pain:  Vitals:   03/02/19 0600  TempSrc: Oral         Complications: No apparent anesthesia complications

## 2019-03-02 NOTE — Op Note (Addendum)
Preoperative diagnosis: Bilateral renal stones, quadriplegia Postoperative diagnosis: Same  Procedure: Cystoscopy, bilateral ureteroscopy, holmium laser lithotripsy, bilateral ureteral stent exchange, left retrograde pyelogram-22 modifier  Surgeon: Junious Silk  Anesthesia: General  Indication for procedure: Ryan Watkins is a 33 year old quadriplegic male with stones noted on KUB increasing in size.  He is known to have tight upper ureters and underwent prestenting bilaterally about a month ago.  Preoperatively he was treated with a round of Augmentin and then remained on nightly nitrofurantoin.  Findings: On exam under anesthesia the penis was circumcised without mass or lesion.  The testicles were descended bilaterally and palpably normal without mass.  No palpable inguinal hernia.  Scrotum appeared normal.    On cystoscopy the urethra is tight but no stricture, the bladder was unremarkable without stone or tumor.  Bilateral stents in place.  Right ureteroscopy revealed a very tight proximal ureter despite stenting.  The access sheath would only go about two thirds of the way up.  The stones were dusted and dusted well.  The ureter was too tight to go in and out removing fragments.  There were 3 stones in the right lower pole and one stone in the right upper pole.  No stones remained on fluoroscopy after dusting.  This also made visualization difficult and despite pressure irrigation.  Left ureteroscopy revealed a tight mid and proximal ureter despite stenting.  The access sheath went up about halfway but not quite over the iliacs.  Left retrograde pyelogram revealed a long upper pole infundibulum and short infundibulum to the mid and lower pole.  The midpole calyx was compounded with a infundibulum dropping off that to the lower pole where the stone was located.  The stone was dropped in the midpole calyx and dusted.  Fragmentation was excellent.  This also made visualization difficult despite pressure  irrigation.  I had to manually irrigate the midpole calyx of some debris and clot with a syringe to visualize the stone.  Multiple stones, bilateral, tight ureters despite stenting, decreased visualization all increase the time and effort substantially greater than typically required which required increased intensity, time, technical difficulty of procedure, severity of patient's condition, physical and mental effort required to complete this case - 22 modifier.  Description of procedure: After consent was obtained patient brought to the operating room.  After adequate anesthesia he was placed lithotomy position and prepped and draped in usual sterile fashion.  A timeout was performed to confirm the patient and procedure.  I did an exam under anesthesia.  The cystoscope was passed per urethra the right ureteral stent grasped and removed through the urethral meatus but a wire would not go through the stent.  I went back into the bladder and passed a sensor wire adjacent to the stent and then came back out and remove the stent.  I then passed the inner cannula of the access sheath which went into the proximal ureter and then the medium access sheath together.  I then passed the dual channel digital scope but the proximal ureter was tight and began to bunched up around the ureteroscope even despite stenting.  Therefore I passed a Glidewire under direct vision and backed out.  I then took a long access sheath and passed the inner dilator into the renal pelvis to dilate the proximal ureter.  I then passed the long access sheath together into the proximal ureter and it did not quite get past the tight proximal ureter but I was able to get the ureteroscope into the  kidney.  There was a stone in the upper pole which was dusted mainly at a setting of 0.2-0.3 and anywhere from 20-40 on the right.  Because the long access sheath did not make it all the way up toward the kidney too much of it was hanging out of the meatus  and interfering with scope manipulation.  Therefore I passed the sensor wire back under direct vision and backed out the long access sheath and scope and then passed a medium access sheath without difficulty.  The ureteroscope was passed back up into the kidney.  Fragmentation of the upper pole stone was excellent.  I then went to the lower pole with 3 stones were noted.  A 0 tip basket was used to grab the stones and drop them in the middle pole calyx for ease of fragmentation.  All 3 of the stones were dusted and again fragmentation was excellent.  A new with a tight ureter going in and out taking fragments out would not be feasible.  On fluoroscopy no other visible stones were noted as well as no significant fragments noted on ureteroscopy.  The access sheath was backed out on the ureteroscope and the ureter inspected on the way out noted to be normal without injury.  The ureter looked like it had slightly dilated in the proximal ureter and in the mid ureter over the iliacs from the scope and access sheath passage.  I left a Glidewire in place on this right side.  Attention was then turned to the left side.  The cystoscope was passed back per urethra and a Glidewire advanced adjacent to the stent and coiled in the upper pole collecting system.  The scope was backed out and then placed again the left ureteral stent grasped and removed without difficulty.  I then passed the access sheath but it would not get up over the iliacs and I wanted to make sure the ureter was clear.  I passed a semirigid scope up over the iliacs and it was simply tight.  Under direct vision a sensor wire was advanced.  The scope was backed out and the medium access sheath was passed about halfway up and that is as far as it would go.  I then passed the dual channel digital ureteroscope and with slow steady pressure finally got it up into the kidney.  I then irrigated the kidney.  I was in the upper pole and could not find the lower pole  although I could see the stone on fluoroscopy.  Retrograde injection of contrast through the scope outlined along upper pole infundibulum and I came back down to the renal pelvis and found the midpole infundibulum and then the infundibulum coming off the mid pole infundibulum going to the lower pole.  The stone was visualized here and I was able to grasp it with the 0 tip basket and dropped it in the midpole calyx.  The laser was passed back up, 200 m, and the stone dusted as well.  Fortunately, fragmentation here was excellent as well.  There were no stone fragments to remove.  No other stones noted on fluoroscopy.  The access sheath was backed out on the ureteroscope and the ureter inspected on the way out noted to be without injury and without stone fragment similar to the right side.   With bilateral Glidewire remaining the left Glidewire was backloaded on the cystoscope and a 626 cm stent advanced.  The wire was removed with a good coil seen in  the left upper pole calyx and a good coil in the bladder.  The bladder was drained the scope broken and that string left on the left stent.  I then went adjacent to the string back loading the right Glidewire and placed a right ureteral stent.  The wire was removed again with a good coil seen in the collecting system and a good coil in the bladder.  The bladder was drained and the scope removed leaving both strings in place.  I then placed a 16 French Foley catheter and we taped in the ureteral stent strings to the catheter.  Final fluoroscopic image noted the stents to be in good position both in the collecting systems and in the bladder with the Foley catheter.  The patient was then awakened and taken to recovery room in stable condition.  Complications: None  Blood loss: Minimal  Specimens: None  Drains: 16 French Foley catheter with bilateral 6 x 26 cm ureteral stents with strings taped to the Foley catheter -patient will remove the Foley and the stents  next Monday.  We will plan ultrasound of kidneys in 5 to 6 weeks.  Continue nightly nitrofurantoin.

## 2019-03-02 NOTE — H&P (Signed)
H&P  Chief Complaint: bilateral renal stones   History of Present Illness: 33 yo Ryan [name] with bilateral stones s/p ureteral stents 01/29/2019 (and botox). He kept a foley for a few days. He took a round of augmentin and now on NF. No fever. Minimal incontinence and hyperreflexic symptoms typical of stents.    Past Medical History:  Diagnosis Date  . Asthma   . Neurogenic bladder    caths 4 x day  . Pneumonia 2009  . Quadriplegia (HCC) 08-2003   C 4  . Sinus infection few weeks ago, off all antibiotics   lungs clear no cough  . UTI (urinary tract infection)    completed Cipro 07/18/11   Past Surgical History:  Procedure Laterality Date  . bladder surgery to remove kidney stones  2006  . BOTOX INJECTION N/A 06/29/2013   Procedure: BOTOX INJECTION;  Surgeon: [name], MD;  Location: [facility];  Service: Urology;  Laterality: N/A;  . BOTOX INJECTION N/A 09/10/2016   Procedure: BOTOX INJECTION;  Surgeon: [name], MD;  Location: [facility];  Service: Urology;  Laterality: N/A;  . BOTOX INJECTION N/A 04/24/2018   Procedure: BOTOX INJECTION WITH CYSTOSCOPY;  Surgeon: [name], MD;  Location: [facility];  Service: Urology;  Laterality: N/A;  . BOTOX INJECTION N/A 01/29/2019   Procedure: BOTOX INJECTION WITH CYSTOSCOPY;  Surgeon: [name], MD;  Location: [facility];  Service: Urology;  Laterality: N/A;  ONLY NEEDS 90 MIN FOR ALL PROCEDURES  . cervical spinal surgery C 4, C5 and C6  08-2003  . CYSTOSCOPY  02/18/2012   Procedure: CYSTOSCOPY;  Surgeon: [name], MD;  Location: [facility];  Service: Urology;  Laterality: N/A;  . CYSTOSCOPY N/A 08/07/2012   Procedure: CYSTOSCOPY WITH BOTOX INJECTION ;  Surgeon: [name], MD;  Location: [facility];  Service: Urology;  Laterality: N/A;  . CYSTOSCOPY N/A 09/10/2016   Procedure: CYSTOSCOPY WITH BOTOX INJECTION;  Surgeon: [name], MD;  Location: [facility];  Service: Urology;  Laterality: N/A;  .  CYSTOSCOPY WITH INJECTION  07/23/2011   Procedure: CYSTOSCOPY WITH INJECTION;  Surgeon: [name], MD;  Location: [facility];  Service: Urology;  Laterality: N/A;  . CYSTOSCOPY WITH INJECTION N/A 03/29/2014   Procedure: CYSTOSCOPY WITH BOTOX INJECTION;  Surgeon: [name], MD;  Location: [facility];  Service: Urology;  Laterality: N/A;  . CYSTOSCOPY WITH INJECTION N/A 12/13/2014   Procedure: CYSTOSCOPY WITH BOTOX;  Surgeon: [name], MD;  Location: [facility];  Service: Urology;  Laterality: N/A;  . CYSTOSCOPY WITH INJECTION N/A 06/16/2015   Procedure: CYSTOSCOPY WITH BOTOX INJECTION;  Surgeon: [name], MD;  Location: [facility];  Service: Urology;  Laterality: N/A;  . CYSTOSCOPY WITH INJECTION N/A 06/10/2017   Procedure: CYSTOSCOPY WITH INJECTION/ BOTOX;  Surgeon: [name], MD;  Location: [facility];  Service: Urology;  Laterality: N/A;  . CYSTOSCOPY WITH RETROGRADE PYELOGRAM, URETEROSCOPY AND STENT PLACEMENT Right 07/13/2013   Procedure: CYSTOSCOPY RIGHT URETEROSCOPY/LASER LITHROSCOPY [name] BASKET EXTRACTION STENT PLACEMENT;  Surgeon: [name], MD;  Location: [facility];  Service: Urology;  Laterality: Right;  . CYSTOSCOPY WITH URETEROSCOPY AND STENT PLACEMENT Right 06/29/2013   Procedure: CYSTOSCOPY WITH RETROGRADE, URETEROSCOPY AND STENT PLACEMENT ON RIGHT;  Surgeon: [name], MD;  Location: [facility];  Service: Urology;  Laterality: Right;  . CYSTOSCOPY/URETEROSCOPY/HOLMIUM LASER/STENT PLACEMENT Right 06/10/2017   Procedure: CYSTOSCOPY/RETROGRADE/URETEROSCOPY/STENT PLACEMENT;  Surgeon: [name], MD;  Location: [facility];  Service: Urology;  Laterality: Right;  .  CYSTOSCOPY/URETEROSCOPY/HOLMIUM LASER/STENT PLACEMENT Right 07/01/2017   Procedure: CYSTOSCOPY/URETEROSCOPY/HOLMIUM LASER/STENT PLACEMENT;  Surgeon: [name], MD;  Location: [facility];  Service: Urology;  Laterality: Right;  . duodenal surgery  feb 2005  . HOLMIUM LASER APPLICATION  Right 2/84/1324   Procedure: RIGHT LASER LITHOTRIPSY;  Surgeon: [name], MD;  Location: [facility];  Service: Urology;  Laterality: Right;  . sacral skin flap surgery  june 2005   on sacrum  . 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 URETEROSCOPY WITH HOLMIUM LASER LITHOTRIPSY Right 01/29/2019   Procedure: CYSTOSCOPY, URETEROSCOPY, BILATERAL RETR0GRADE PYLEOGRAM, AND BILATERAL STENT PLACEMENT;  Surgeon: [name], MD;  Location: [facility];  Service: Urology;  Laterality: Right;  . vena cana filter placement  08-2003   right groin  . WISDOM TOOTH EXTRACTION  2009    Home Medications:  Medications Prior to Admission  Medication Sig Dispense Refill Last Dose  . fluticasone (FLONASE) 50 MCG/ACT nasal spray Place 1 spray into both nostrils daily.    03/01/2019 at Unknown time  . ibuprofen (ADVIL,MOTRIN) 200 MG tablet Take 400 mg by mouth every 8 (eight) hours as needed for headache or moderate pain.    Past Week at Unknown time  . nitrofurantoin, macrocrystal-monohydrate, (MACROBID) 100 MG capsule Take 1 capsule (100 mg total) by mouth at bedtime. 45 capsule 0 03/01/2019 at Unknown time  . polyethylene glycol (MIRALAX / GLYCOLAX) packet Take 17 g by mouth daily as needed for mild constipation.   Past Month at Unknown time   Allergies:  Allergies  Allergen Reactions  . Morphine And Related Other (See Comments)    hallucinations    History reviewed. No pertinent family history. Social History:  reports that he has never smoked. He has never used smokeless tobacco. He reports that he does not drink alcohol or use drugs.  ROS: A complete review of systems was performed.  All systems are negative except for pertinent findings as noted. Review of Systems  All other systems reviewed and are negative.    Physical Exam:  Vital signs in last 24 hours: Temp:  [98.4 F (36.9 C)] 98.4 F (36.9 C) (10/27 0600) Pulse Rate:  [84] 84 (10/27  0600) Resp:  [18] 18 (10/27 0600) BP: (142)/(104) 142/104 (10/27 0600) SpO2:  [98 %] 98 % (10/27 0600) General:  Alert and oriented, No acute distress HEENT: Normocephalic, atraumatic Neck: No JVD or lymphadenopathy Cardiovascular: Regular rate and rhythm Lungs: Regular rate and effort Abdomen: Soft, nontender, nondistended, no abdominal masses Back: No CVA tenderness Extremities: No edema Neurologic: Cranial nerves grossly intact, not moving extremities   Laboratory Data:  No results found for this or any previous visit (from the past 24 hour(s)). Recent Results (from the past 240 hour(s))  Novel Coronavirus, NAA (Hosp order, Send-out to Ref Lab; TAT 18-24 hrs     Status: None   Collection Time: 02/26/19  8:07 AM   Specimen: Nasopharyngeal Swab; Respiratory  Result Value Ref Range Status   SARS-CoV-2, NAA NOT DETECTED NOT DETECTED Final    Comment: (NOTE) This nucleic acid amplification test was developed and its performance characteristics determined by [employer]. Nucleic acid amplification tests include PCR and TMA. This test has not been FDA cleared or approved. This test has been authorized by FDA under an Emergency Use Authorization (EUA). This test is only authorized for the duration of time the declaration that circumstances exist justifying the authorization of the emergency use  of in vitro diagnostic tests for detection of SARS-CoV-2 virus and/or diagnosis of COVID-19 infection under section 564(b)(1) of the Act, 21 U.S.C. 161WRU-0(A360bbb-3(b) (1), unless the authorization is terminated or revoked sooner. When diagnostic testing is negative, the possibility of a false negative result should be considered in the context of a patient's recent exposures and the presence of clinical signs and symptoms consistent with COVID-19. An individual without symptoms of COVID- 19 and who is not shedding SARS-CoV-2 vi rus would expect to have a negative (not detected) result in  this assay. Performed At: [facility] [address] [place], [place] [id number] Jolene SchimkeNagendra Sanjai MD YN:[id number]:[phone]    Coronavirus Source NASOPHARYNGEAL  Final    Comment: Performed at [facility], [address] [place], [place] [address]   Creatinine: No results for input(s): CREATININE in the last 168 hours.  Impression/Assessment:  Bilateral renal stones  NG bladder  Ryan Watkins factor infertility   Plan:  I discussed with the patient the nature, potential benefits, risks and alternatives to cystoscopy, bilateral URS/HLL/stent exchange, including side effects of the proposed treatment, the likelihood of the patient achieving the goals of the procedure, and any potential problems that might occur during the procedure or recuperation. All questions answered. Patient elects to proceed.    Jerilee FieldMatthew Ariadne Rissmiller 03/02/2019, 7:18 AM

## 2019-03-02 NOTE — Anesthesia Preprocedure Evaluation (Signed)
Anesthesia Evaluation  Patient identified by MRN, date of birth, ID band Patient awake    Reviewed: Allergy & Precautions, NPO status , Patient's Chart, lab work & pertinent test results  History of Anesthesia Complications Negative for: history of anesthetic complications  Airway Mallampati: II  TM Distance: >3 FB Neck ROM: Full    Dental   Pulmonary asthma ,    Pulmonary exam normal        Cardiovascular negative cardio ROS Normal cardiovascular exam     Neuro/Psych  Neuromuscular disease (C4 quadriplegia) negative psych ROS   GI/Hepatic negative GI ROS, Neg liver ROS,   Endo/Other  negative endocrine ROS  Renal/GU negative Renal ROS Bladder dysfunction (neurogenic bladder)      Musculoskeletal negative musculoskeletal ROS (+)   Abdominal   Peds  Hematology negative hematology ROS (+)   Anesthesia Other Findings   Reproductive/Obstetrics                             Anesthesia Physical  Anesthesia Plan  ASA: III  Anesthesia Plan: General   Post-op Pain Management:    Induction: Intravenous  PONV Risk Score and Plan: 3 and Ondansetron, Dexamethasone, Treatment may vary due to age or medical condition and Midazolam  Airway Management Planned: LMA  Additional Equipment: None  Intra-op Plan:   Post-operative Plan: Extubation in OR  Informed Consent: I have reviewed the patients History and Physical, chart, labs and discussed the procedure including the risks, benefits and alternatives for the proposed anesthesia with the patient or authorized representative who has indicated his/her understanding and acceptance.       Plan Discussed with:   Anesthesia Plan Comments:         Anesthesia Quick Evaluation

## 2019-03-02 NOTE — Anesthesia Procedure Notes (Signed)
Procedure Name: LMA Insertion Date/Time: 03/02/2019 7:41 AM Performed by: Silas Sacramento, CRNA Pre-anesthesia Checklist: Patient identified, Emergency Drugs available, Suction available and Patient being monitored Patient Re-evaluated:Patient Re-evaluated prior to induction Oxygen Delivery Method: Circle system utilized Preoxygenation: Pre-oxygenation with 100% oxygen Induction Type: IV induction LMA: LMA inserted LMA Size: 4.0 Tube type: Oral Number of attempts: 1 Placement Confirmation: ETT inserted through vocal cords under direct vision,  positive ETCO2 and breath sounds checked- equal and bilateral Tube secured with: Tape Dental Injury: Teeth and Oropharynx as per pre-operative assessment

## 2019-03-02 NOTE — Discharge Instructions (Signed)
Indwelling Urinary Catheter Care, Adult An indwelling urinary catheter is a thin tube that is put into your bladder. The tube helps to drain pee (urine) out of your body. The tube goes in through your urethra. Your urethra is where pee comes out of your body. Your pee will come out through the catheter, then it will go into a bag (drainage bag). Take Ryan Watkins care of your catheter so it will work well.  Removal of the catheter (and ureteral stents): Remove the Foley catheter (and ureteral stents - the strings are taped to the Foley) on Monday morning March 08, 2019.  How to wear your catheter and bag Supplies needed  Sticky tape (adhesive tape) or a leg strap.  Alcohol wipe or soap and water (if you use tape).  A clean towel (if you use tape).  Large overnight bag.  Smaller bag (leg bag). Wearing your catheter Attach your catheter to your leg with tape or a leg strap.  Make sure the catheter is not pulled tight.  If a leg strap gets wet, take it off and put on a dry strap.  If you use tape to hold the bag on your leg: 1. Use an alcohol wipe or soap and water to wash your skin where the tape made it sticky before. 2. Use a clean towel to pat-dry that skin. 3. Use new tape to make the bag stay on your leg. Wearing your bags You should have been given a large overnight bag.  You may wear the overnight bag in the day or night.  Always have the overnight bag lower than your bladder.  Do not let the bag touch the floor.  Before you go to sleep, put a clean plastic bag in a wastebasket. Then hang the overnight bag inside the wastebasket. You should also have a smaller leg bag that fits under your clothes.  Always wear the leg bag below your knee.  Do not wear your leg bag at night. How to care for your skin and catheter Supplies needed  A clean washcloth.  Water and mild soap.  A clean towel. Caring for your skin and catheter      Clean the skin around your catheter  every day: ? Wash your hands with soap and water. ? Wet a clean washcloth in warm water and mild soap. ? Clean the skin around your urethra. ? If you are male: ? Gently spread the folds of skin around your vagina (labia). ? With the washcloth in your other hand, wipe the inner side of your labia on each side. Wipe from front to back. ? If you are male: ? Pull back any skin that covers the end of your penis (foreskin). ? With the washcloth in your other hand, wipe your penis in small circles. Start wiping at the tip of your penis, then move away from the catheter. ? Move the foreskin back in place, if needed. ? With your free hand, hold the catheter close to where it goes into your body. ? Keep holding the catheter during cleaning so it does not get pulled out. ? With the washcloth in your other hand, clean the catheter. ? Only wipe downward on the catheter. ? Do not wipe upward toward your body. Doing this may push germs into your urethra and cause infection. ? Use a clean towel to pat-dry the catheter and the skin around it. Make sure to wipe off all soap. ? Wash your hands with soap and water.  Shower every day. Do not take baths.  Do not use cream, ointment, or lotion on the area where the catheter goes into your body, unless your doctor tells you to.  Do not use powders, sprays, or lotions on your genital area.  Check your skin around the catheter every day for signs of infection. Check for: ? Redness, swelling, or pain. ? Fluid or blood. ? Warmth. ? Pus or a bad smell. How to empty the bag Supplies needed  Rubbing alcohol.  Gauze pad or cotton ball.  Tape or a leg strap. Emptying the bag Pour the pee out of your bag when it is ?- full, or at least 2-3 times a day. Do this for your overnight bag and your leg bag. 1. Wash your hands with soap and water. 2. Separate (detach) the bag from your leg. 3. Hold the bag over the toilet or a clean pail. Keep the bag lower than  your hips and bladder. This is so the pee (urine) does not go back into the tube. 4. Open the pour spout. It is at the bottom of the bag. 5. Empty the pee into the toilet or pail. Do not let the pour spout touch any surface. 6. Put rubbing alcohol on a gauze pad or cotton ball. 7. Use the gauze pad or cotton ball to clean the pour spout. 8. Close the pour spout. 9. Attach the bag to your leg with tape or a leg strap. 10. Wash your hands with soap and water. Follow instructions for cleaning the drainage bag:  From the product maker.  As told by your doctor. How to change the bag Supplies needed  Alcohol wipes.  A clean bag.  Tape or a leg strap. Changing the bag Replace your bag when it starts to leak, smell bad, or look dirty. 1. Wash your hands with soap and water. 2. Separate the dirty bag from your leg. 3. Pinch the catheter with your fingers so that pee does not spill out. 4. Separate the catheter tube from the bag tube where these tubes connect (at the connection valve). Do not let the tubes touch any surface. 5. Clean the end of the catheter tube with an alcohol wipe. Use a different alcohol wipe to clean the end of the bag tube. 6. Connect the catheter tube to the tube of the clean bag. 7. Attach the clean bag to your leg with tape or a leg strap. Do not make the bag tight on your leg. 8. Wash your hands with soap and water. General rules   Never pull on your catheter. Never try to take it out. Doing that can hurt you.  Always wash your hands before and after you touch your catheter or bag. Use a mild, fragrance-free soap. If you do not have soap and water, use hand sanitizer.  Always make sure there are no twists or bends (kinks) in the catheter tube.  Always make sure there are no leaks in the catheter or bag.  Drink enough fluid to keep your pee pale yellow.  Do not take baths, swim, or use a hot tub.  If you are male, wipe from front to back after you poop  (have a bowel movement). Contact a doctor if:  Your pee is cloudy.  Your pee smells worse than usual.  Your catheter gets clogged.  Your catheter leaks.  Your bladder feels full. Get help right away if:  You have redness, swelling, or pain where the catheter goes  into your body.  You have fluid, blood, pus, or a bad smell coming from the area where the catheter goes into your body.  Your skin feels warm where the catheter goes into your body.  You have a fever.  You have pain in your: ? Belly (abdomen). ? Legs. ? Lower back. ? Bladder.  You see blood in the catheter.  Your pee is pink or red.  You feel sick to your stomach (nauseous).  You throw up (vomit).  You have chills.  Your pee is not draining into the bag.  Your catheter gets pulled out. Summary  An indwelling urinary catheter is a thin tube that is placed into the bladder to help drain pee (urine) out of the body.  The catheter is placed into the part of the body that drains pee from the bladder (urethra).  Taking Ryan Watkins care of your catheter will keep it working properly and help prevent problems.  Always wash your hands before and after touching your catheter or bag.  Never pull on your catheter or try to take it out. This information is not intended to replace advice given to you by your health care provider. Make sure you discuss any questions you have with your health care provider. Document Released: 08/17/2012 Document Revised: 08/14/2018 Document Reviewed: 12/06/2016 Elsevier Patient Education  [employer].  Ureteral Stent Implantation, Care After This sheet gives you information about how to care for yourself after your procedure. Your health care provider may also give you more specific instructions. If you have problems or questions, contact your health care provider. What can I expect after the procedure? After the procedure, it is common to have:  Nausea.  Mild pain when you  urinate. You may feel this pain in your lower back or lower abdomen. The pain should stop within a few minutes after you urinate. This may last for up to 1 week.  A small amount of blood in your urine for several days. Follow these instructions at home: Medicines  Take over-the-counter and prescription medicines only as told by your health care provider.  If you were prescribed an antibiotic medicine, take it as told by your health care provider. Do not stop taking the antibiotic even if you start to feel better.  Do not drive for 24 hours if you were given a sedative during your procedure.  Ask your health care provider if the medicine prescribed to you requires you to avoid driving or using heavy machinery. Activity  Rest as told by your health care provider.  Avoid sitting for a long time without moving. Get up to take short walks every 1-2 hours. This is important to improve blood flow and breathing. Ask for help if you feel weak or unsteady.  Return to your normal activities as told by your health care provider. Ask your health care provider what activities are safe for you. General instructions   Watch for any blood in your urine. Call your health care provider if the amount of blood in your urine increases.  If you have a catheter: ? Follow instructions from your health care provider about taking care of your catheter and collection bag. ? Do not take baths, swim, or use a hot tub until your health care provider approves. Ask your health care provider if you may take showers. You may only be allowed to take sponge baths.  Drink enough fluid to keep your urine pale yellow.  Do not use any products that contain  nicotine or tobacco, such as cigarettes, e-cigarettes, and chewing tobacco. These can delay healing after surgery. If you need help quitting, ask your health care provider.  Keep all follow-up visits as told by your health care provider. This is important. Contact a  health care provider if:  You have pain that gets worse or does not get better with medicine, especially pain when you urinate.  You have difficulty urinating.  You feel nauseous or you vomit repeatedly during a period of more than 2 days after the procedure. Get help right away if:  Your urine is dark red or has blood clots in it.  You are leaking urine (have incontinence).  The end of the stent comes out of your urethra.  You cannot urinate.  You have sudden, sharp, or severe pain in your abdomen or lower back.  You have a fever.  You have swelling or pain in your legs.  You have difficulty breathing. Summary  After the procedure, it is common to have mild pain when you urinate that goes away within a few minutes after you urinate. This may last for up to 1 week.  Watch for any blood in your urine. Call your health care provider if the amount of blood in your urine increases.  Take over-the-counter and prescription medicines only as told by your health care provider.  Drink enough fluid to keep your urine pale yellow. This information is not intended to replace advice given to you by your health care provider. Make sure you discuss any questions you have with your health care provider. Document Released: 12/23/2012 Document Revised: 01/27/2018 Document Reviewed: 01/28/2018 Elsevier Patient Education  2020 Elsevier Inc.   General Anesthesia, Adult, Care After This sheet gives you information about how to care for yourself after your procedure. Your health care provider may also give you more specific instructions. If you have problems or questions, contact your health care provider. What can I expect after the procedure? After the procedure, the following side effects are common:  Pain or discomfort at the IV site.  Nausea.  Vomiting.  Sore throat.  Trouble concentrating.  Feeling cold or chills.  Weak or tired.  Sleepiness and fatigue.  Soreness and body  aches. These side effects can affect parts of the body that were not involved in surgery. Follow these instructions at home:  For at least 24 hours after the procedure:  Have a responsible adult stay with you. It is important to have someone help care for you until you are awake and alert.  Rest as needed.  Do not: ? Participate in activities in which you could fall or become injured. ? Drive. ? Use heavy machinery. ? Drink alcohol. ? Take sleeping pills or medicines that cause drowsiness. ? Make important decisions or sign legal documents. ? Take care of children on your own. Eating and drinking  Follow any instructions from your health care provider about eating or drinking restrictions.  When you feel hungry, start by eating small amounts of foods that are soft and easy to digest (bland), such as toast. Gradually return to your regular diet.  Drink enough fluid to keep your urine pale yellow.  If you vomit, rehydrate by drinking water, juice, or clear broth. General instructions  If you have sleep apnea, surgery and certain medicines can increase your risk for breathing problems. Follow instructions from your health care provider about wearing your sleep device: ? Anytime you are sleeping, including during daytime naps. ? While taking  prescription pain medicines, sleeping medicines, or medicines that make you drowsy.  Return to your normal activities as told by your health care provider. Ask your health care provider what activities are safe for you.  Take over-the-counter and prescription medicines only as told by your health care provider.  If you smoke, do not smoke without supervision.  Keep all follow-up visits as told by your health care provider. This is important. Contact a health care provider if:  You have nausea or vomiting that does not get better with medicine.  You cannot eat or drink without vomiting.  You have pain that does not get better with  medicine.  You are unable to pass urine.  You develop a skin rash.  You have a fever.  You have redness around your IV site that gets worse. Get help right away if:  You have difficulty breathing.  You have chest pain.  You have blood in your urine or stool, or you vomit blood. Summary  After the procedure, it is common to have a sore throat or nausea. It is also common to feel tired.  Have a responsible adult stay with you for the first 24 hours after general anesthesia. It is important to have someone help care for you until you are awake and alert.  When you feel hungry, start by eating small amounts of foods that are soft and easy to digest (bland), such as toast. Gradually return to your regular diet.  Drink enough fluid to keep your urine pale yellow.  Return to your normal activities as told by your health care provider. Ask your health care provider what activities are safe for you. This information is not intended to replace advice given to you by your health care provider. Make sure you discuss any questions you have with your health care provider. Document Released: 07/29/2000 Document Revised: 04/25/2017 Document Reviewed: 12/06/2016 Elsevier Patient Education  2020 [employer].

## 2019-03-02 NOTE — Anesthesia Postprocedure Evaluation (Signed)
Anesthesia Post Note  Patient: JONAVAN VANHORN  Procedure(s) Performed: CYSTOSCOPY/URETEROSCOPY WITH HOLMIUM LASER LITHOTRIPSY/ RETROGRADE PYELOGRAM/ STENT EXCHANGE (Bilateral )     Patient location during evaluation: PACU Anesthesia Type: General Level of consciousness: awake and alert Pain management: pain level controlled Vital Signs Assessment: post-procedure vital signs reviewed and stable Respiratory status: spontaneous breathing, nonlabored ventilation, respiratory function stable and patient connected to nasal cannula oxygen Cardiovascular status: blood pressure returned to baseline and stable Postop Assessment: no apparent nausea or vomiting Anesthetic complications: no    Last Vitals:  Vitals:   03/02/19 1030 03/02/19 1114  BP: (!) 131/99 (!) 141/101  Pulse: 80 100  Resp: 16 20  Temp: 36.8 C 37.6 C  SpO2: 96% 98%    Last Pain:  Vitals:   03/02/19 1114  TempSrc: Oral  PainSc: 0-No pain                 Pervis Hocking

## 2019-03-03 ENCOUNTER — Other Ambulatory Visit: Payer: Self-pay | Admitting: Urology

## 2019-03-03 ENCOUNTER — Encounter (HOSPITAL_COMMUNITY): Payer: Self-pay | Admitting: Urology

## 2019-03-03 DIAGNOSIS — N200 Calculus of kidney: Secondary | ICD-10-CM

## 2019-04-19 ENCOUNTER — Ambulatory Visit
Admission: RE | Admit: 2019-04-19 | Discharge: 2019-04-19 | Disposition: A | Discharge status: Home / Self Care | Payer: Medicare Other | Source: Ambulatory Visit | Attending: Urology | Admitting: Urology

## 2019-04-19 DIAGNOSIS — N200 Calculus of kidney: Secondary | ICD-10-CM

## 2019-07-07 IMAGING — XA DG C-ARM 61-120 MIN
2 series · 4 of 4 positions shown · non-contrast
Comparison: [DATE]

CLINICAL DATA: Right renal stone

EXAM:
DG C-ARM 61-120 MIN

[Series 1: uro standard · 3 of 3 slices shown (1 of 2)]
[im 1/3]
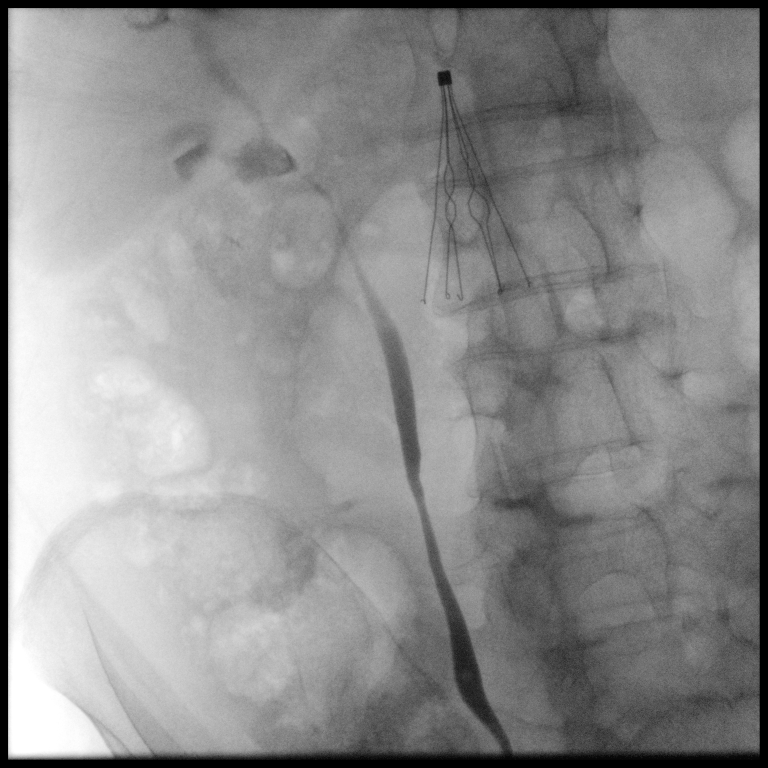
[im 2/3]
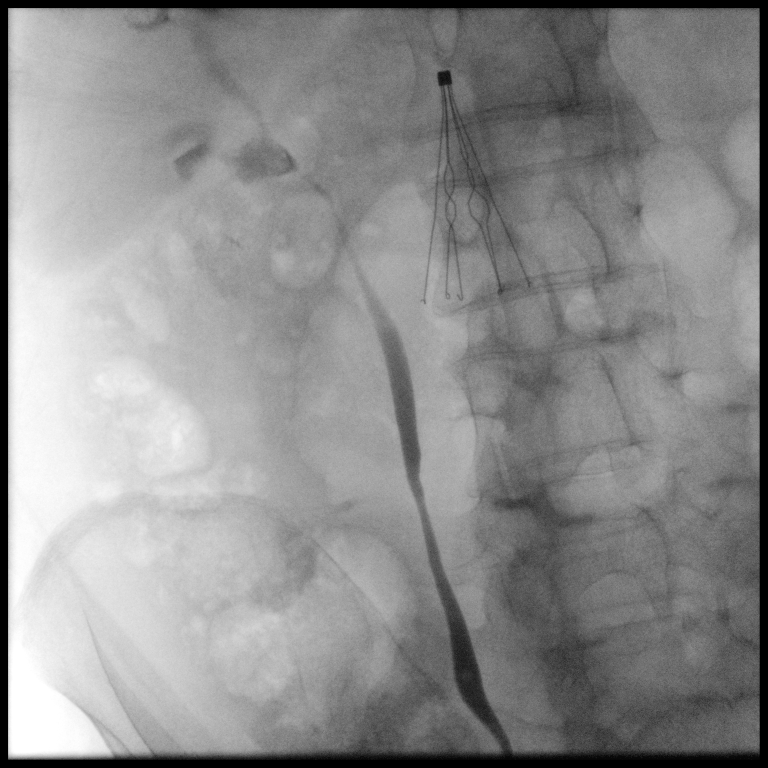
[im 3/3]
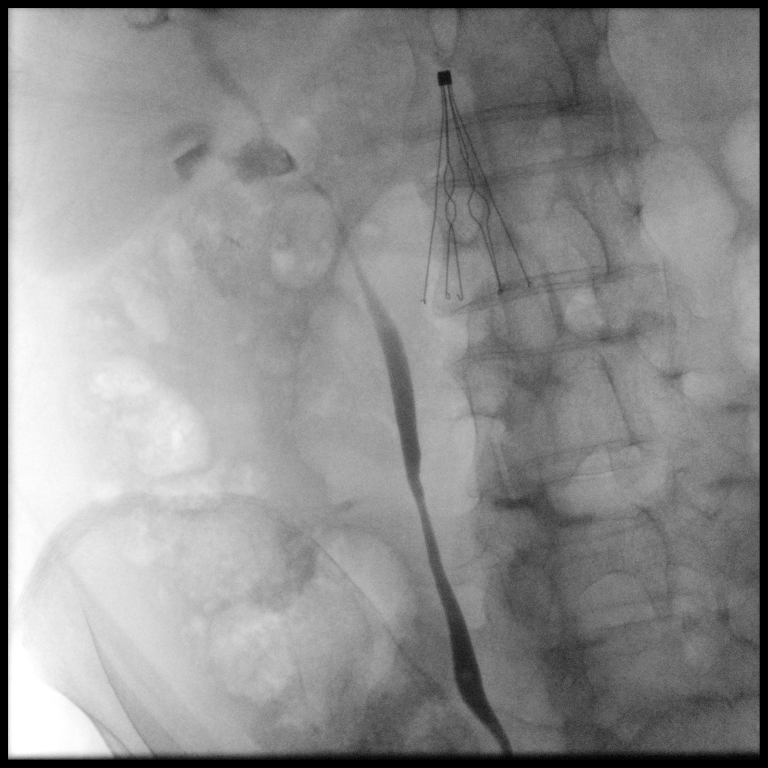

[Series 4: uro standard · 1 of 1 slices shown (2 of 2)]
[im 1/1]
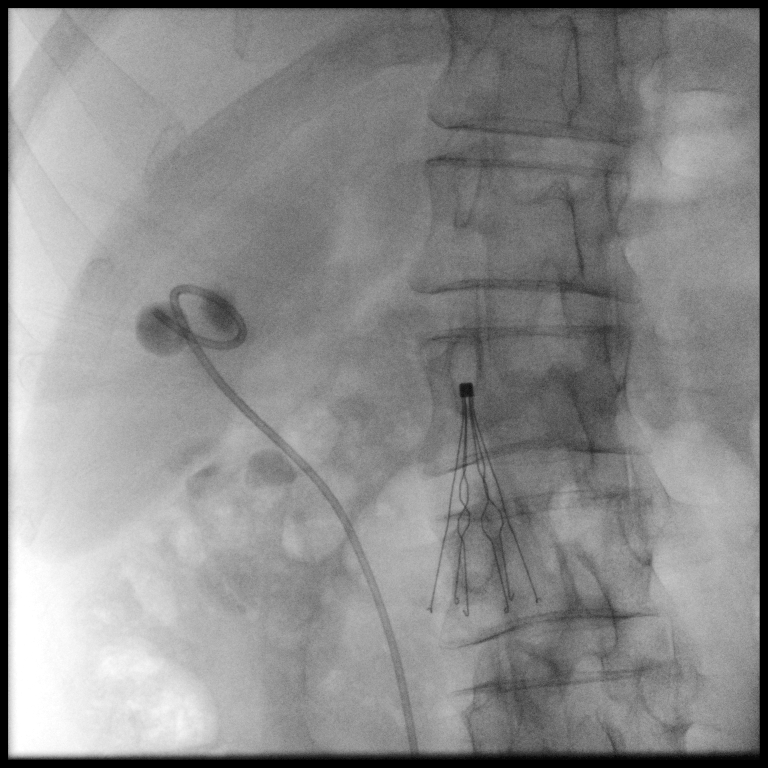

[4 of 4 positions shown; findings below may reference images not displayed]

FLUOROSCOPY TIME:  Fluoroscopy Time:  36 seconds

Radiation Exposure Index (if provided by the fluoroscopic device):
3.6 mGy

Number of Acquired Spot Images: 2
FINDINGS: Initial image demonstrates a right retrograde pyelogram with filling
defect within the right renal pelvis consistent with stone seen on
the prior exam. IVC filter is noted in place. Subsequently a
ureteral stent is noted in the upper pole caliceal system.
IMPRESSION: Right ureteral stent placement.

Right renal pelvic stone

## 2019-08-31 ENCOUNTER — Other Ambulatory Visit: Payer: Self-pay | Admitting: Urology

## 2019-09-14 ENCOUNTER — Encounter (HOSPITAL_COMMUNITY): Payer: Self-pay

## 2019-09-15 ENCOUNTER — Other Ambulatory Visit: Payer: Self-pay

## 2019-09-15 ENCOUNTER — Encounter (HOSPITAL_COMMUNITY): Payer: Self-pay

## 2019-09-15 ENCOUNTER — Encounter (HOSPITAL_COMMUNITY)
Admission: RE | Admit: 2019-09-15 | Discharge: 2019-09-15 | Disposition: A | Discharge status: Home / Self Care | Payer: Medicare Other | Source: Ambulatory Visit | Attending: Urology | Admitting: Urology

## 2019-09-15 HISTORY — DX: Personal history of urinary calculi: Z87.442

## 2019-09-15 HISTORY — DX: Thrombocytopenia, unspecified: D69.6

## 2019-09-15 HISTORY — DX: Anemia, unspecified: D64.9

## 2019-09-15 NOTE — Patient Instructions (Signed)
DUE TO COVID-19 ONLY ONE VISITOR ARE ALLOWED TO COME WITH YOU AND STAY IN THE WAITING ROOM ONLY DURING PRE OP AND PROCEDURE. THEN TWO VISITORS MAY VISIT WITH YOU IN YOUR PRIVATE ROOM DURING VISITING HOURS ONLY!!   COVID SWAB TESTING MUST BE COMPLETED ON:  Tuesday, Sep 21, 2019 9:15AM   [address], [place] -[facility] enter pre surgical testing line (Must self quarantine after testing. Follow instructions on handout.)             Your procedure is scheduled on: Friday, Sep 24, 2019   Report to [facility] Main  Entrance    Report to admitting at 9:45 AM   Call this number if you have problems the morning of surgery [phone]   Do not eat food or drink liquids :After Midnight.  Oral Hygiene is also important to reduce your risk of infection.                                    Remember - BRUSH YOUR TEETH THE MORNING OF SURGERY WITH YOUR REGULAR TOOTHPASTE   Do NOT smoke after Midnight   Take these medicines the morning of surgery with A SIP OF WATER: None                               You may not have any metal on your body including jewelry, and body piercings             Do not wear lotions, powders, perfumes/cologne, or deodorant                          Men may shave face and neck.   Do not bring valuables to the hospital. [place].   Contacts, dentures or bridgework may not be worn into surgery.    Patients discharged the day of surgery will not be allowed to drive home.   Special Instructions: Bring a copy of your healthcare power of attorney and living will documents         the day of surgery if you haven't scanned them in before.              Please read over the following fact sheets you were given: IF YOU HAVE QUESTIONS ABOUT YOUR PRE OP INSTRUCTIONS PLEASE CALL [phone]   [facility] - Preparing for Surgery Before surgery, you can play an important role.  Because skin is  not sterile, your skin needs to be as free of germs as possible.  You can reduce the number of germs on your skin by washing with CHG (chlorahexidine gluconate) soap before surgery.  CHG is an antiseptic cleaner which kills germs and bonds with the skin to continue killing germs even after washing. Please DO NOT use if you have an allergy to CHG or antibacterial soaps.  If your skin becomes reddened/irritated stop using the CHG and inform your nurse when you arrive at Short Stay. Do not shave (including legs and underarms) for at least 48 hours prior to the first CHG shower.  You may shave your face/neck.  Please follow these instructions carefully:  1.  Shower with CHG Soap the night before surgery  and the  morning of surgery.  2.  If you choose to wash your hair, wash your hair first as usual with your normal  shampoo.  3.  After you shampoo, rinse your hair and body thoroughly to remove the shampoo.                             4.  Use CHG as you would any other liquid soap.  You can apply chg directly to the skin and wash.  Gently with a scrungie or clean washcloth.  5.  Apply the CHG Soap to your body ONLY FROM THE NECK DOWN.   Do   not use on face/ open                           Wound or open sores. Avoid contact with eyes, ears mouth and   genitals (private parts).                       Wash face,  Genitals (private parts) with your normal soap.             6.  Wash thoroughly, paying special attention to the area where your    surgery  will be performed.  7.  Thoroughly rinse your body with warm water from the neck down.  8.  DO NOT shower/wash with your normal soap after using and rinsing off the CHG Soap.                9.  Pat yourself dry with a clean towel.            10.  Wear clean pajamas.            11.  Place clean sheets on your bed the night of your first shower and do not  sleep with pets. Day of Surgery : Do not apply any lotions/deodorants the morning of surgery.  Please wear  clean clothes to the hospital/surgery center.  FAILURE TO FOLLOW THESE INSTRUCTIONS MAY RESULT IN THE CANCELLATION OF YOUR SURGERY  PATIENT SIGNATURE_________________________________  NURSE SIGNATURE__________________________________  ________________________________________________________________________

## 2019-09-15 NOTE — Progress Notes (Signed)
PCP - Dr. Kirtland Bouchard [place] Cardiologist - N/A  Chest x-ray - N/A EKG - greater than 1 year Stress Test - N/A ECHO - greater than 2 years Cardiac Cath - N/A  Sleep Study - N/A CPAP - N/A  Fasting Blood Sugar - N/A Checks Blood Sugar __N/A___ times a day  Blood Thinner Instructions: N/A Aspirin Instructions: N/A Last Dose: N/A  Anesthesia review: N/A  Patient denies shortness of breath, fever, cough and chest pain at PAT appointment   Patient verbalized understanding of instructions that were given to them at the PAT appointment. Patient was also instructed that they will need to review over the PAT instructions again at home before surgery.

## 2019-09-16 ENCOUNTER — Encounter (HOSPITAL_COMMUNITY): Payer: Medicare Other

## 2019-09-21 ENCOUNTER — Other Ambulatory Visit (HOSPITAL_COMMUNITY)
Admission: RE | Admit: 2019-09-21 | Discharge: 2019-09-21 | Disposition: A | Discharge status: Home / Self Care | Payer: Medicare Other | Source: Ambulatory Visit | Attending: Urology | Admitting: Urology

## 2019-09-21 ENCOUNTER — Other Ambulatory Visit: Payer: Self-pay

## 2019-09-21 ENCOUNTER — Encounter (HOSPITAL_COMMUNITY)
Admission: RE | Admit: 2019-09-21 | Discharge: 2019-09-21 | Disposition: A | Discharge status: Home / Self Care | Payer: Medicare Other | Source: Ambulatory Visit | Attending: Urology | Admitting: Urology

## 2019-09-21 DIAGNOSIS — Z20822 Contact with and (suspected) exposure to covid-19: Secondary | ICD-10-CM | POA: Insufficient documentation

## 2019-09-21 DIAGNOSIS — Z01812 Encounter for preprocedural laboratory examination: Secondary | ICD-10-CM | POA: Insufficient documentation

## 2019-09-21 LAB — CBC
HCT: 42.9 % (ref 39.0–52.0)
Hemoglobin: 14.2 g/dL (ref 13.0–17.0)
MCH: 29 pg (ref 26.0–34.0)
MCHC: 33.1 g/dL (ref 30.0–36.0)
MCV: 87.6 fL (ref 80.0–100.0)
Platelets: 194 10*3/uL (ref 150–400)
RBC: 4.9 MIL/uL (ref 4.22–5.81)
RDW: 13.3 % (ref 11.5–15.5)
WBC: 7 10*3/uL (ref 4.0–10.5)
nRBC: 0 % (ref 0.0–0.2)

## 2019-09-21 LAB — SARS CORONAVIRUS 2 (TAT 6-24 HRS): SARS Coronavirus 2: NEGATIVE

## 2019-09-24 ENCOUNTER — Encounter (HOSPITAL_COMMUNITY)
Admission: RE | Disposition: A | Discharge status: Home / Self Care | Payer: Self-pay | Source: Home / Self Care | Attending: Urology

## 2019-09-24 ENCOUNTER — Ambulatory Visit (HOSPITAL_COMMUNITY)
Admission: RE | Admit: 2019-09-24 | Discharge: 2019-09-24 | Disposition: A | Discharge status: Home / Self Care | Payer: Medicare Other | Attending: Urology | Admitting: Urology

## 2019-09-24 ENCOUNTER — Ambulatory Visit (HOSPITAL_COMMUNITY): Payer: Medicare Other | Admitting: Certified Registered Nurse Anesthetist

## 2019-09-24 ENCOUNTER — Encounter (HOSPITAL_COMMUNITY): Payer: Self-pay | Admitting: Urology

## 2019-09-24 DIAGNOSIS — G8250 Quadriplegia, unspecified: Secondary | ICD-10-CM | POA: Insufficient documentation

## 2019-09-24 DIAGNOSIS — N319 Neuromuscular dysfunction of bladder, unspecified: Secondary | ICD-10-CM | POA: Diagnosis present

## 2019-09-24 DIAGNOSIS — N318 Other neuromuscular dysfunction of bladder: Secondary | ICD-10-CM

## 2019-09-24 HISTORY — PX: BOTOX INJECTION: SHX5754

## 2019-09-24 SURGERY — BOTOX INJECTION
Anesthesia: General

## 2019-09-24 MED ORDER — LIDOCAINE 2% (20 MG/ML) 5 ML SYRINGE
INTRAMUSCULAR | Status: DC | PRN
  Administered 2019-09-24: 30 mg via INTRAVENOUS

## 2019-09-24 MED ORDER — SODIUM CHLORIDE (PF) 0.9 % IJ SOLN
INTRAMUSCULAR | Status: DC | PRN
  Administered 2019-09-24: 20 mL

## 2019-09-24 MED ORDER — ONABOTULINUMTOXINA 100 UNITS IJ SOLR: 300.0000 [IU] | Freq: Once | INTRAMUSCULAR | Status: DC

## 2019-09-24 MED ORDER — ONABOTULINUMTOXINA 100 UNITS IJ SOLR
INTRAMUSCULAR | Status: DC | PRN
  Administered 2019-09-24: 100 [IU] via INTRAMUSCULAR

## 2019-09-24 MED ORDER — PHENYLEPHRINE HCL-NACL 10-0.9 MG/250ML-% IV SOLN
INTRAVENOUS | Status: DC | PRN
Start: 2019-09-24 — End: 2019-09-24
  Administered 2019-09-24: 20 ug/min via INTRAVENOUS

## 2019-09-24 MED ORDER — MIDAZOLAM HCL 2 MG/2ML IJ SOLN
INTRAMUSCULAR | Status: AC
  Filled 2019-09-24: qty 2

## 2019-09-24 MED ORDER — MIDAZOLAM HCL 5 MG/5ML IJ SOLN
INTRAMUSCULAR | Status: DC | PRN
  Administered 2019-09-24: 2 mg via INTRAVENOUS

## 2019-09-24 MED ORDER — PROPOFOL 10 MG/ML IV BOLUS
INTRAVENOUS | Status: AC
  Filled 2019-09-24: qty 20

## 2019-09-24 MED ORDER — DEXAMETHASONE SODIUM PHOSPHATE 10 MG/ML IJ SOLN
INTRAMUSCULAR | Status: AC
  Filled 2019-09-24: qty 1

## 2019-09-24 MED ORDER — ONDANSETRON HCL 4 MG/2ML IJ SOLN
INTRAMUSCULAR | Status: DC | PRN
  Administered 2019-09-24: 4 mg via INTRAVENOUS

## 2019-09-24 MED ORDER — PROPOFOL 10 MG/ML IV BOLUS
INTRAVENOUS | Status: DC | PRN
  Administered 2019-09-24: 120 mg via INTRAVENOUS

## 2019-09-24 MED ORDER — FENTANYL CITRATE (PF) 100 MCG/2ML IJ SOLN
INTRAMUSCULAR | Status: DC | PRN
  Administered 2019-09-24 (×2): 50 ug via INTRAVENOUS

## 2019-09-24 MED ORDER — LACTATED RINGERS IV SOLN: INTRAVENOUS | Status: DC

## 2019-09-24 MED ORDER — SODIUM CHLORIDE (PF) 0.9 % IJ SOLN
INTRAMUSCULAR | Status: AC
  Filled 2019-09-24: qty 50

## 2019-09-24 MED ORDER — ONABOTULINUMTOXINA 100 UNITS IJ SOLR
INTRAMUSCULAR | Status: AC
  Filled 2019-09-24: qty 200

## 2019-09-24 MED ORDER — DEXAMETHASONE SODIUM PHOSPHATE 10 MG/ML IJ SOLN
INTRAMUSCULAR | Status: DC | PRN
Start: 2019-09-24 — End: 2019-09-24
  Administered 2019-09-24: 10 mg via INTRAVENOUS

## 2019-09-24 MED ORDER — SODIUM CHLORIDE 0.9 % IV SOLN
3.0000 g | Freq: Once | INTRAVENOUS | Status: AC
  Administered 2019-09-24: 3 g via INTRAVENOUS
  Filled 2019-09-24: qty 3

## 2019-09-24 MED ORDER — LIDOCAINE 2% (20 MG/ML) 5 ML SYRINGE
INTRAMUSCULAR | Status: AC
  Filled 2019-09-24: qty 5

## 2019-09-24 MED ORDER — FENTANYL CITRATE (PF) 100 MCG/2ML IJ SOLN
INTRAMUSCULAR | Status: AC
  Filled 2019-09-24: qty 2

## 2019-09-24 MED ORDER — SODIUM CHLORIDE 0.9 % IR SOLN
Status: DC | PRN
  Administered 2019-09-24: 3000 mL via INTRAVESICAL

## 2019-09-24 MED ORDER — ONDANSETRON HCL 4 MG/2ML IJ SOLN
INTRAMUSCULAR | Status: AC
  Filled 2019-09-24: qty 2

## 2019-09-24 SURGICAL SUPPLY — 15 items
BAG URO CATCHER STRL LF (MISCELLANEOUS) ×3 IMPLANT
CLOTH BEACON ORANGE TIMEOUT ST (SAFETY) ×3 IMPLANT
GLOVE BIOGEL M STRL SZ7.5 (GLOVE) ×9 IMPLANT
GOWN STRL REUS W/TWL XL LVL3 (GOWN DISPOSABLE) ×6 IMPLANT
KIT TURNOVER KIT A (KITS) ×3 IMPLANT
MANIFOLD NEPTUNE II (INSTRUMENTS) ×3 IMPLANT
NDL SAFETY ECLIPSE 18X1.5 (NEEDLE) ×10 IMPLANT
NEEDLE ASPIRATION 22 (NEEDLE) ×3 IMPLANT
NEEDLE HYPO 18GX1.5 SHARP (NEEDLE) ×30
PENCIL SMOKE EVACUATOR (MISCELLANEOUS) IMPLANT
SYR CONTROL 10ML LL (SYRINGE) ×3 IMPLANT
TRAY CYSTO PACK (CUSTOM PROCEDURE TRAY) ×3 IMPLANT
TUBING CONNECTING 10 (TUBING) ×2 IMPLANT
TUBING CONNECTING 10' (TUBING) ×1
WATER STERILE IRR 3000ML UROMA (IV SOLUTION) ×3 IMPLANT

## 2019-09-24 NOTE — Op Note (Signed)
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  Findings: Cystourethroscopy unremarkable apart from the usual tightness of the membranous urethra and bladder neck.  Otherwise bladder unremarkable.  No stone or foreign body.  Mucosa appeared normal.  Description of procedure: After consent was obtained patient brought to the operating room.  After adequate anesthesia was placed lithotomy position prepped and draped in the usual sterile fashion.  Timeout was performed to confirm the patient and procedure.  Needle scope was passed per urethra into the bladder and bladder inspected.  22-gauge needle was then inserted and Botox was injected over 20 injections, 1 mL each, 10units/cc.  Oozing from some of the injection sites was observed and noted to stop bleeding on their own.  Adequate hemostasis was insured.  Bladder was emptied and the scope removed.  He was then awakened taken recovery room in stable condition.  Complications: None  Blood loss: Minimal  Specimens: None  Drains: None  Disposition: Patient stable to PACU

## 2019-09-24 NOTE — Discharge Instructions (Signed)
Botulinum Toxin Bladder Injection  A botulinum toxin bladder injection is a procedure to treat an overactive bladder. During the procedure, a drug called botulinum toxin is injected into the bladder through a long, thin needle. This drug relaxes the bladder muscles and reduces overactivity. You may need this procedure if your medicines are not working or you cannot take them. The procedure may be repeated as needed. The treatment usually lasts for 6 months. Your health care provider will monitor you to see how well you respond. Tell a health care provider about:  Any allergies you have.  All medicines you are taking, including vitamins, herbs, eye drops, creams, and over-the-counter medicines.  Any problems you or family members have had with anesthetic medicines.  Any blood disorders you have.  Any surgeries you have had.  Any medical conditions you have.  Any previous reactions to a botulinum toxin injection.  Any symptoms of urinary tract infection. These include chills, fever, a burning feeling when passing urine, and needing to pass urine often.  Whether you are pregnant or may be pregnant. What are the risks? Generally this is a safe procedure. However, problems may occur, including:  Not being able to pass urine. If this happens, you may need to have your bladder emptied with a thin tube inserted into your urethra (urinary catheter).  Bleeding.  Urinary tract infection.  Allergic reaction to the botulinum toxin.  Pain or burning when passing urine.  Damage to other structures or organs. What happens before the procedure? Staying hydrated Follow instructions from your health care provider about hydration, which may include:  Up to 2 hours before the procedure - you may continue to drink clear liquids, such as water, clear fruit juice, black coffee, and plain tea. Eating and drinking restrictions Follow instructions from your health care provider about eating and  drinking, which may include:  8 hours before the procedure - stop eating heavy meals or foods, such as meat, fried foods, or fatty foods.  6 hours before the procedure - stop eating light meals or foods, such as toast or cereal.  6 hours before the procedure - stop drinking milk or drinks that contain milk.  2 hours before the procedure - stop drinking clear liquids. Medicines Ask your health care provider about:  Changing or stopping your regular medicines. This is especially important if you are taking diabetes medicines or blood thinners.  Taking medicines such as aspirin and ibuprofen. These medicines can thin your blood. Do not take these medicines unless your health care provider tells you to take them.  Taking over-the-counter medicines, vitamins, herbs, and supplements. General instructions  Plan to have someone take you home from the hospital or clinic.  If you will be going home right after the procedure, plan to have someone with you for 24 hours.  Ask your health care provider what steps will be taken to help prevent infection. These may include: ? Removing hair at the procedure site. ? Washing skin with a germ-killing soap. ? Antibiotic medicine. What happens during the procedure?   You will be asked to empty your bladder.  An IV will be inserted into one of your veins.  You will be given one or more of the following: ? A medicine to help you relax (sedative). ? A medicine to numb the area (local anesthetic). ? A medicine to make you fall asleep (general anesthetic).  A long, thin scope called a cystoscope will be passed into your bladder through the part   of the body that carries urine from your bladder (urethra).  The cystoscope will be used to fill your bladder with water.  A long needle will be passed through the cystoscope and into the bladder.  The botulinum toxin will be injected into your bladder. It may be injected into multiple areas of your  bladder.  Your bladder will be emptied, and the cystoscope will be removed. The procedure may vary among health care providers and hospitals. What can I expect after procedure? After your procedure, it is common to have:  Blood-tinged urine.  Burning or soreness when you pass urine. Follow these instructions at home: Medicines  Take over-the-counter and prescription medicines only as told by your health care provider.  If you were prescribed an antibiotic medicine, take it as told by your health care provider. Do not stop taking the antibiotic even if you start to feel better. General instructions   Do not drive for 24 hours if you were given a sedative during your procedure.  Drink enough fluid to keep your urine pale yellow.  Return to your normal activities as told by your health care provider. Ask your health care provider what activities are safe for you.  Keep all follow-up visits as told by your health care provider. This is important. Contact a health care provider if you have:  A fever or chills.  Blood-tinged urine for more than one day after your procedure.  Worsening pain or burning when you pass urine.  Pain or burning when passing urine for more than two days after your procedure.  Trouble emptying your bladder. Get help right away if you:  Have bright red blood in your urine.  Are unable to pass urine. Summary  A botulinum toxin bladder injection is a procedure to treat an overactive bladder.  This is generally a very safe procedure. However, problems may occur, including not being able to pass urine, bleeding, infection, pain, and allergic reactions to medicines.  You will be told when to stop eating and drinking, and what medicines to change or stop. Follow instructions carefully.  After the procedure, it is common to have blood in urine and to have soreness or burning when passing urine.  Contact a health care provider if you have a fever, have  blood in urine for more than a few days, or have trouble passing urine. Get help right away if you have bright red blood in the urine, or if you are unable to pass urine. This information is not intended to replace advice given to you by your health care provider. Make sure you discuss any questions you have with your health care provider. Document Revised: 10/31/2017 Document Reviewed: 10/31/2017 Elsevier Patient Education  2020 [employer] Inc[employer]  

## 2019-09-24 NOTE — H&P (Signed)
H&P  Chief Complaint: Neurogenic bladder, incontinence  History of Present Illness: Edwin is a 34 year old white male with quadriplegia.  He has a neurogenic bladder managed with CIC.  He undergoes Botox injection.  He is having symptoms of hyperreflexia and incontinence in between catheterizations now.  We started him on oxybutynin which stopped the incontinence and increased cath volume but not the hyperreflexic symptoms (sweaty, blood pressure increase). He presents today for both Botox.  He started preop nitrofurantoin for E. coli.  We will give him Unasyn preop.  He has been well without fever or gross hematuria.  Past Medical History:  Diagnosis Date  . Anemia   . Asthma   . History of kidney stones   . Neurogenic bladder    caths 4 x day  . Pneumonia 2009  . Quadriplegia (HCC) 08-2003   C 4  . Sinus infection few weeks ago, off all antibiotics   lungs clear no cough  . Thrombocytopenia (HCC)   . UTI (urinary tract infection)    completed Cipro 07/18/11   Past Surgical History:  Procedure Laterality Date  . bladder surgery to remove kidney stones  2006  . BOTOX INJECTION N/A 06/29/2013   Procedure: BOTOX INJECTION;  Surgeon: [name], MD;  Location: [facility];  Service: Urology;  Laterality: N/A;  . BOTOX INJECTION N/A 09/10/2016   Procedure: BOTOX INJECTION;  Surgeon: [name], MD;  Location: [facility];  Service: Urology;  Laterality: N/A;  . BOTOX INJECTION N/A 04/24/2018   Procedure: BOTOX INJECTION WITH CYSTOSCOPY;  Surgeon: [name], MD;  [facility];  Service: Urology;  Laterality: N/A;  . BOTOX INJECTION N/A 01/29/2019   Procedure: BOTOX INJECTION WITH CYSTOSCOPY;  Surgeon: [name], MD;  Location[facility];  Service: Urology;  Laterality: N/A;  ONLY NEEDS 90 MIN FOR ALL PROCEDURES  . cervical spinal surgery C 4, C5 and C6  08-2003  . CYSTOSCOPY  02/18/2012   Procedure: CYSTOSCOPY;  Surgeon: [name], MD;  [facility];  Service: Urology;  Laterality: N/A;  . CYSTOSCOPY N/A 08/07/2012   Procedure: CYSTOSCOPY WITH BOTOX INJECTION ;  Surgeon: [name], MD;  [facility];  Service: Urology;  Laterality: N/A;  . CYSTOSCOPY N/A 09/10/2016   Procedure: CYSTOSCOPY WITH BOTOX INJECTION;  Surgeon: [name], MD;  [facility];  Service: Urology;  Laterality: N/A;  . CYSTOSCOPY WITH INJECTION  07/23/2011   Procedure: CYSTOSCOPY WITH INJECTION;  Surgeon: [name], MD;  [facility];  Service: Urology;  Laterality: N/A;  . CYSTOSCOPY WITH INJECTION N/A 03/29/2014   Procedure: CYSTOSCOPY WITH BOTOX INJECTION;  Surgeon: [name], MD;  [facility];  Service: Urology;  Laterality: N/A;  . CYSTOSCOPY WITH INJECTION N/A 12/13/2014   Procedure: CYSTOSCOPY WITH BOTOX;  Surgeon: [name], MD;  [facility];  Service: Urology;  Laterality: N/A;  . CYSTOSCOPY WITH INJECTION N/A 06/16/2015   Procedure: CYSTOSCOPY WITH BOTOX INJECTION;  Surgeon: [name], MD;  [facility];  Service: Urology;  Laterality: N/A;  . CYSTOSCOPY WITH INJECTION N/A 06/10/2017   Procedure: CYSTOSCOPY WITH INJECTION/ BOTOX;  Surgeon: [name], MD;  [facility];  Service: Urology;  Laterality: N/A;  . CYSTOSCOPY WITH RETROGRADE PYELOGRAM, URETEROSCOPY AND STENT PLACEMENT Right 07/13/2013   Procedure: CYSTOSCOPY RIGHT URETEROSCOPY/LASER LITHROSCOPY [name] BASKET EXTRACTION STENT PLACEMENT;  Surgeon: [name], MD;  [facility];  Service: Urology;  Laterality: Right;  . CYSTOSCOPY WITH URETEROSCOPY AND  STENT PLACEMENT Right 06/29/2013   Procedure: CYSTOSCOPY WITH RETROGRADE, URETEROSCOPY AND STENT PLACEMENT ON RIGHT;  Surgeon: [name], MD;  [facility];  Service: Urology;  Laterality: Right;  . CYSTOSCOPY/URETEROSCOPY/HOLMIUM LASER/STENT PLACEMENT Right 06/10/2017   Procedure: CYSTOSCOPY/RETROGRADE/URETEROSCOPY/STENT PLACEMENT;   Surgeon: [name], MD;  [facility];  Service: Urology;  Laterality: Right;  . CYSTOSCOPY/URETEROSCOPY/HOLMIUM LASER/STENT PLACEMENT Right 07/01/2017   Procedure: CYSTOSCOPY/URETEROSCOPY/HOLMIUM LASER/STENT PLACEMENT;  Surgeon: [name], MD;  [facility];  Service: Urology;  Laterality: Right;  . duodenal surgery  feb 2005  . HOLMIUM LASER APPLICATION Right 07/13/2013   Procedure: RIGHT LASER LITHOTRIPSY;  Surgeon: [name], MD;  [facility];  Service: Urology;  Laterality: Right;  . sacral skin flap surgery  june 2005   on sacrum  . 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 URETEROSCOPY WITH HOLMIUM LASER LITHOTRIPSY Right 01/29/2019   Procedure: CYSTOSCOPY, URETEROSCOPY, BILATERAL RETR0GRADE PYLEOGRAM, AND BILATERAL STENT PLACEMENT;  Surgeon: [name], MD;  [facility];  Service: Urology;  Laterality: Right;  . URETEROSCOPY WITH HOLMIUM LASER LITHOTRIPSY Bilateral 03/02/2019   Procedure: CYSTOSCOPY/URETEROSCOPY WITH HOLMIUM LASER LITHOTRIPSY/ RETROGRADE PYELOGRAM/ STENT EXCHANGE;  Surgeon: [name], MD;  [facility];  Service: Urology;  Laterality: Bilateral;  . vena cana filter placement  08-2003   right groin  . WISDOM TOOTH EXTRACTION  2009    Home Medications:  No medications prior to admission.   Allergies:  Allergies  Allergen Reactions  . Morphine And Related Other (See Comments)    hallucinations    No family history on file. Social History:  reports that he has never smoked. He has never used smokeless tobacco. He reports current alcohol use. He reports that he does not use drugs.  ROS: A complete review of systems was performed.  All systems are negative except for pertinent findings as noted. ROS   Physical Exam:  Vital signs in last 24 hours:   General:  Alert and oriented, No acute distress HEENT: Normocephalic, atraumatic Cardiovascular: Regular rate and  rhythm Lungs: Regular rate and effort Abdomen: Soft, nontender, nondistended, no abdominal masses Back: No CVA tenderness Extremities: No edema Neurologic: CN intact   Laboratory Data:  No results found for this or any previous visit (from the past 24 hour(s)). Recent Results (from the past 240 hour(s))  SARS CORONAVIRUS 2 (TAT 6-24 HRS) Nasopharyngeal Nasopharyngeal Swab     Status: None   Collection Time: 09/21/19  9:41 AM   Specimen: Nasopharyngeal Swab  Result Value Ref Range Status   SARS Coronavirus 2 NEGATIVE NEGATIVE Final    Comment: (NOTE) SARS-CoV-2 target nucleic acids are NOT DETECTED. The SARS-CoV-2 RNA is generally detectable in upper and lower respiratory specimens during the acute phase of infection. Negative results do not preclude SARS-CoV-2 infection, do not rule out co-infections with other pathogens, and should not be used as the sole basis for treatment or other patient management decisions. Negative results must be combined with clinical observations, patient history, and epidemiological information. The expected result is Negative. Fact Sheet for Patients: HairSlick.no Fact Sheet for Healthcare Providers: quierodirigir.com This test is not yet approved or cleared by the [place] FDA and  has been authorized for detection and/or diagnosis of SARS-CoV-2 by FDA under an Emergency Use Authorization (EUA). This EUA will remain  in effect (meaning this test can be used) for the duration of the COVID-19 declaration under Section 56 4(b)(1)  of the Act, 21 U.S.C. section 360bbb-3(b)(1), unless the authorization is terminated or revoked sooner. Performed at [facility], [place] [address], [place],  [address]    Creatinine: No results for input(s): CREATININE in the last 168 hours.  Impression/Assessment/plan:  I discussed with the patient the nature, potential benefits, risks and  alternatives to cystoscopy with Botox injection, including side effects of the proposed treatment, the likelihood of the patient achieving the goals of the procedure, and any potential problems that might occur during the procedure or recuperation. We also discussed the risk of proceeding with possible exposure to novel coronavirus and the real risk of delay, including the expectation that a delay of 6-8 weeks or more may be required to emerge from an environment in which COVID-19 is less prevalent. All questions answered. Patient elects to proceed.    [name] 09/24/2019, 8:47 AM

## 2019-09-24 NOTE — Anesthesia Procedure Notes (Signed)
Procedure Name: LMA Insertion Date/Time: 09/24/2019 12:03 PM Performed by: Epimenio Sarin, CRNA Pre-anesthesia Checklist: Patient identified, Emergency Drugs available, Suction available, Patient being monitored and Timeout performed Patient Re-evaluated:Patient Re-evaluated prior to induction Oxygen Delivery Method: Circle system utilized Preoxygenation: Pre-oxygenation with 100% oxygen Induction Type: IV induction Ventilation: Mask ventilation without difficulty LMA: LMA with gastric port inserted LMA Size: 4.0 Number of attempts: 1 Dental Injury: Teeth and Oropharynx as per pre-operative assessment

## 2019-09-24 NOTE — Transfer of Care (Signed)
Immediate Anesthesia Transfer of Care Note  Patient: Ryan Watkins  Procedure(s) Performed: BOTOX INJECTION INTO BLADDER WITH CYSTOSCOPY (N/A )  Patient Location: PACU  Anesthesia Type:General  Level of Consciousness: awake and patient cooperative  Airway & Oxygen Therapy: Patient Spontanous Breathing and Patient connected to face mask oxygen  Post-op Assessment: Report given to RN and Post -op Vital signs reviewed and stable  Post vital signs: Reviewed and stable  Last Vitals:  Vitals Value Taken Time  BP 120/93 09/24/19 1250  Temp    Pulse 69 09/24/19 1251  Resp 10 09/24/19 1251  SpO2 100 % 09/24/19 1251  Vitals shown include unvalidated device data.  Last Pain:  Vitals:   09/24/19 1013  TempSrc: Oral  PainSc: 0-No pain         Complications: No apparent anesthesia complications

## 2019-09-24 NOTE — Anesthesia Postprocedure Evaluation (Signed)
Anesthesia Post Note  Patient: Ryan Watkins  Procedure(s) Performed: BOTOX INJECTION INTO BLADDER WITH CYSTOSCOPY (N/A )     Patient location during evaluation: PACU Anesthesia Type: General Level of consciousness: awake and alert, oriented and patient cooperative Pain management: pain level controlled Vital Signs Assessment: post-procedure vital signs reviewed and stable Respiratory status: spontaneous breathing, nonlabored ventilation and respiratory function stable Cardiovascular status: blood pressure returned to baseline and stable Postop Assessment: no apparent nausea or vomiting Anesthetic complications: no    Last Vitals:  Vitals:   09/24/19 1300 09/24/19 1338  BP: (!) 119/92 108/84  Pulse: 68 63  Resp: 11 15  Temp:  36.6 C  SpO2: 100% 99%    Last Pain:  Vitals:   09/24/19 1300  TempSrc:   PainSc: 0-No pain                 JACKSON,E. CARSWELL

## 2019-09-24 NOTE — Anesthesia Preprocedure Evaluation (Addendum)
Anesthesia Evaluation  Patient identified by MRN, date of birth, ID band Patient awake    Reviewed: Allergy & Precautions, NPO status , Patient's Chart, lab work & pertinent test results  History of Anesthesia Complications Negative for: history of anesthetic complications  Airway Mallampati: I  TM Distance: >3 FB Neck ROM: Full    Dental  (+) Dental Advisory Given   Pulmonary  09/21/2019 SARS coronavirus neg   breath sounds clear to auscultation       Cardiovascular negative cardio ROS   Rhythm:Regular Rate:Normal     Neuro/Psych C4 quadriplegia   Neurogenic bladder    GI/Hepatic negative GI ROS, Neg liver ROS,   Endo/Other  negative endocrine ROS  Renal/GU negative Renal ROS     Musculoskeletal   Abdominal   Peds  Hematology negative hematology ROS (+)   Anesthesia Other Findings   Reproductive/Obstetrics                            Anesthesia Physical Anesthesia Plan  ASA: III  Anesthesia Plan: General   Post-op Pain Management:    Induction: Intravenous  PONV Risk Score and Plan: 2 and Treatment may vary due to age or medical condition  Airway Management Planned: LMA  Additional Equipment: None  Intra-op Plan:   Post-operative Plan:   Informed Consent: I have reviewed the patients History and Physical, chart, labs and discussed the procedure including the risks, benefits and alternatives for the proposed anesthesia with the patient or authorized representative who has indicated his/her understanding and acceptance.     Dental advisory given  Plan Discussed with: CRNA and Surgeon  Anesthesia Plan Comments:        Anesthesia Quick Evaluation

## 2019-09-27 ENCOUNTER — Encounter: Payer: Self-pay | Admitting: *Deleted

## 2019-12-31 ENCOUNTER — Other Ambulatory Visit: Payer: Self-pay | Admitting: Urology

## 2020-01-19 NOTE — Progress Notes (Signed)
DUE TO COVID-19 ONLY ONE VISITOR IS ALLOWED TO COME WITH YOU AND STAY IN THE WAITING ROOM ONLY DURING PRE OP AND PROCEDURE DAY OF SURGERY. THE 1 VISITOR  MAY VISIT WITH YOU AFTER SURGERY IN YOUR PRIVATE ROOM DURING VISITING HOURS ONLY!  YOU NEED TO HAVE A COVID 19 TEST ON__9/21/21 _____ @_______ , THIS TEST MUST BE DONE BEFORE SURGERY,  COVID TESTING SITE [address] [place] , IT IS ON THE RIGHT GOING OUT [address] APPROXIMATELY  2 MINUTES PAST [employer] ON THE RIGHT. ONCE YOUR COVID TEST IS COMPLETED,  PLEASE BEGIN THE QUARANTINE INSTRUCTIONS AS OUTLINED IN YOUR HANDOUT.                Ryan Watkins  01/19/2020   Your procedure is scheduled on: 01/28/20    Report to [facility] Main  Entrance   Report to admitting at       12 noon      Call this number if you have problems the morning of surgery [phone]    Remember: Do not eat food , candy gum or mints :After Midnight. You may have clear liquids from midnight until  1100am     CLEAR LIQUID DIET   Foods Allowed                                                                       Coffee and tea, regular and decaf                              Plain Jell-O any favor except red or purple                                            Fruit ices (not with fruit pulp)                                      Iced Popsicles                                     Carbonated beverages, regular and diet                                    Cranberry, grape and apple juices Sports drinks like Gatorade Lightly seasoned clear broth or consume(fat free) Sugar, honey syrup   _____________________________________________________________________    BRUSH YOUR TEETH MORNING OF SURGERY AND RINSE YOUR MOUTH OUT, NO CHEWING GUM CANDY OR MINTS.     Take these medicines the morning of surgery with A SIP OF WATER: none   DO NOT TAKE ANY DIABETIC MEDICATIONS DAY OF YOUR SURGERY                                You may not  have any metal on your body including hair pins and              piercings  Do not wear jewelry, make-up, lotions, powders or perfumes, deodorant             Do not wear nail polish on your fingernails.  Do not shave  48 hours prior to surgery.              Men may shave face and neck.   Do not bring valuables to the hospital. [place] IS NOT             RESPONSIBLE   FOR VALUABLES.  Contacts, dentures or bridgework may not be worn into surgery.  Leave suitcase in the car. After surgery it may be brought to your room.     Patients discharged the day of surgery will not be allowed to drive home. IF YOU ARE HAVING SURGERY AND GOING HOME THE SAME DAY, YOU MUST HAVE AN ADULT TO DRIVE YOU HOME AND BE WITH YOU FOR 24 HOURS. YOU MAY GO HOME BY TAXI OR UBER OR ORTHERWISE, BUT AN ADULT MUST ACCOMPANY YOU HOME AND STAY WITH YOU FOR 24 HOURS.  Name and phone number of your driver:  Special Instructions: N/A              Please read over the following fact sheets you were given: _____________________________________________________________________  [facility] - Preparing for Surgery Before surgery, you can play an important role.  Because skin is not sterile, your skin needs to be as free of germs as possible.  You can reduce the number of germs on your skin by washing with CHG (chlorahexidine gluconate) soap before surgery.  CHG is an antiseptic cleaner which kills germs and bonds with the skin to continue killing germs even after washing. Please DO NOT use if you have an allergy to CHG or antibacterial soaps.  If your skin becomes reddened/irritated stop using the CHG and inform your nurse when you arrive at Short Stay. Do not shave (including legs and underarms) for at least 48 hours prior to the first CHG shower.  You may shave your face/neck. Please follow these instructions carefully:  1.  Shower with CHG Soap the night before surgery and the  morning of Surgery.  2.  If you choose  to wash your hair, wash your hair first as usual with your  normal  shampoo.  3.  After you shampoo, rinse your hair and body thoroughly to remove the  shampoo.                           4.  Use CHG as you would any other liquid soap.  You can apply chg directly  to the skin and wash                       Gently with a scrungie or clean washcloth.  5.  Apply the CHG Soap to your body ONLY FROM THE NECK DOWN.   Do not use on face/ open                           Wound or open sores. Avoid contact with eyes, ears mouth and genitals (private parts).  Wash face,  Genitals (private parts) with your normal soap.             6.  Wash thoroughly, paying special attention to the area where your surgery  will be performed.  7.  Thoroughly rinse your body with warm water from the neck down.  8.  DO NOT shower/wash with your normal soap after using and rinsing off  the CHG Soap.                9.  Pat yourself dry with a clean towel.            10.  Wear clean pajamas.            11.  Place clean sheets on your bed the night of your first shower and do not  sleep with pets. Day of Surgery : Do not apply any lotions/deodorants the morning of surgery.  Please wear clean clothes to the hospital/surgery center.  FAILURE TO FOLLOW THESE INSTRUCTIONS MAY RESULT IN THE CANCELLATION OF YOUR SURGERY PATIENT SIGNATURE_________________________________  NURSE SIGNATURE__________________________________  ________________________________________________________________________

## 2020-01-20 ENCOUNTER — Other Ambulatory Visit (HOSPITAL_COMMUNITY): Payer: Self-pay | Admitting: Urology

## 2020-01-20 ENCOUNTER — Encounter (HOSPITAL_COMMUNITY)
Admission: RE | Admit: 2020-01-20 | Discharge: 2020-01-20 | Disposition: A | Discharge status: Home / Self Care | Payer: Medicare Other | Source: Ambulatory Visit | Attending: Urology | Admitting: Urology

## 2020-01-20 ENCOUNTER — Other Ambulatory Visit: Payer: Self-pay

## 2020-01-20 ENCOUNTER — Ambulatory Visit (HOSPITAL_COMMUNITY)
Admission: RE | Admit: 2020-01-20 | Discharge: 2020-01-20 | Disposition: A | Discharge status: Home / Self Care | Payer: Medicare Other | Source: Ambulatory Visit | Attending: Urology | Admitting: Urology

## 2020-01-20 DIAGNOSIS — N200 Calculus of kidney: Secondary | ICD-10-CM | POA: Insufficient documentation

## 2020-01-20 LAB — BASIC METABOLIC PANEL
Anion gap: 8 (ref 5–15)
BUN: 14 mg/dL (ref 6–20)
CO2: 25 mmol/L (ref 22–32)
Calcium: 8.7 mg/dL — ABNORMAL LOW (ref 8.9–10.3)
Chloride: 105 mmol/L (ref 98–111)
Creatinine, Ser: 0.54 mg/dL — ABNORMAL LOW (ref 0.61–1.24)
GFR calc Af Amer: >60 mL/min (ref >60)
GFR calc non Af Amer: >60 mL/min (ref >60)
Glucose, Bld: 133 mg/dL — ABNORMAL HIGH (ref 70–99)
Potassium: 4 mmol/L (ref 3.5–5.1)
Sodium: 138 mmol/L (ref 135–145)

## 2020-01-20 LAB — CBC
HCT: 41.3 % (ref 39.0–52.0)
Hemoglobin: 13.9 g/dL (ref 13.0–17.0)
MCH: 29 pg (ref 26.0–34.0)
MCHC: 33.7 g/dL (ref 30.0–36.0)
MCV: 86.2 fL (ref 80.0–100.0)
Platelets: 193 10*3/uL (ref 150–400)
RBC: 4.79 MIL/uL (ref 4.22–5.81)
RDW: 13.6 % (ref 11.5–15.5)
WBC: 5.5 10*3/uL (ref 4.0–10.5)
nRBC: 0 % (ref 0.0–0.2)

## 2020-01-20 NOTE — Progress Notes (Signed)
BMP done 01/20/20 faxed via epic to Dr Mena Goes.

## 2020-01-25 ENCOUNTER — Other Ambulatory Visit (HOSPITAL_COMMUNITY)
Admission: RE | Admit: 2020-01-25 | Discharge: 2020-01-25 | Disposition: A | Discharge status: Home / Self Care | Payer: Medicare Other | Source: Ambulatory Visit | Attending: Urology | Admitting: Urology

## 2020-01-25 DIAGNOSIS — Z01812 Encounter for preprocedural laboratory examination: Secondary | ICD-10-CM | POA: Diagnosis present

## 2020-01-25 DIAGNOSIS — Z20822 Contact with and (suspected) exposure to covid-19: Secondary | ICD-10-CM | POA: Insufficient documentation

## 2020-01-25 LAB — SARS CORONAVIRUS 2 (TAT 6-24 HRS): SARS Coronavirus 2: NEGATIVE

## 2020-01-27 MED ORDER — SODIUM CHLORIDE 0.9 % IV SOLN
2.0000 g | Freq: Once | INTRAVENOUS | Status: DC
  Filled 2020-01-27: qty 2
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
## 2020-01-28 ENCOUNTER — Encounter (HOSPITAL_COMMUNITY)
Admission: RE | Disposition: A | Discharge status: Home / Self Care | Payer: Self-pay | Source: Home / Self Care | Attending: Urology

## 2020-01-28 ENCOUNTER — Ambulatory Visit (HOSPITAL_COMMUNITY): Payer: Medicare Other | Admitting: Certified Registered"

## 2020-01-28 ENCOUNTER — Ambulatory Visit (HOSPITAL_COMMUNITY)
Admission: RE | Admit: 2020-01-28 | Discharge: 2020-01-28 | Disposition: A | Discharge status: Home / Self Care | Payer: Medicare Other | Attending: Urology | Admitting: Urology

## 2020-01-28 ENCOUNTER — Encounter (HOSPITAL_COMMUNITY): Payer: Self-pay | Admitting: Urology

## 2020-01-28 ENCOUNTER — Telehealth (HOSPITAL_COMMUNITY): Payer: Self-pay | Admitting: *Deleted

## 2020-01-28 DIAGNOSIS — N210 Calculus in bladder: Secondary | ICD-10-CM | POA: Insufficient documentation

## 2020-01-28 DIAGNOSIS — G8250 Quadriplegia, unspecified: Secondary | ICD-10-CM | POA: Insufficient documentation

## 2020-01-28 DIAGNOSIS — Z8049 Family history of malignant neoplasm of other genital organs: Secondary | ICD-10-CM | POA: Diagnosis not present

## 2020-01-28 DIAGNOSIS — Z801 Family history of malignant neoplasm of trachea, bronchus and lung: Secondary | ICD-10-CM | POA: Diagnosis not present

## 2020-01-28 DIAGNOSIS — Z8249 Family history of ischemic heart disease and other diseases of the circulatory system: Secondary | ICD-10-CM | POA: Insufficient documentation

## 2020-01-28 DIAGNOSIS — S14104S Unspecified injury at C4 level of cervical spinal cord, sequela: Secondary | ICD-10-CM | POA: Diagnosis not present

## 2020-01-28 DIAGNOSIS — Z79899 Other long term (current) drug therapy: Secondary | ICD-10-CM | POA: Insufficient documentation

## 2020-01-28 DIAGNOSIS — G904 Autonomic dysreflexia: Secondary | ICD-10-CM | POA: Insufficient documentation

## 2020-01-28 DIAGNOSIS — Z791 Long term (current) use of non-steroidal anti-inflammatories (NSAID): Secondary | ICD-10-CM | POA: Diagnosis not present

## 2020-01-28 DIAGNOSIS — N318 Other neuromuscular dysfunction of bladder: Secondary | ICD-10-CM | POA: Insufficient documentation

## 2020-01-28 DIAGNOSIS — Z885 Allergy status to narcotic agent status: Secondary | ICD-10-CM | POA: Insufficient documentation

## 2020-01-28 DIAGNOSIS — N319 Neuromuscular dysfunction of bladder, unspecified: Secondary | ICD-10-CM

## 2020-01-28 DIAGNOSIS — Z808 Family history of malignant neoplasm of other organs or systems: Secondary | ICD-10-CM | POA: Diagnosis not present

## 2020-01-28 HISTORY — PX: BOTOX INJECTION: SHX5754

## 2020-01-28 SURGERY — BOTOX INJECTION
Anesthesia: General

## 2020-01-28 MED ORDER — PROPOFOL 10 MG/ML IV BOLUS
INTRAVENOUS | Status: AC
  Filled 2020-01-28: qty 20

## 2020-01-28 MED ORDER — FENTANYL CITRATE (PF) 100 MCG/2ML IJ SOLN
INTRAMUSCULAR | Status: AC
  Filled 2020-01-28: qty 2

## 2020-01-28 MED ORDER — CHLORHEXIDINE GLUCONATE 0.12 % MT SOLN
15.0000 mL | Freq: Once | OROMUCOSAL | Status: AC
  Administered 2020-01-28: 15 mL via OROMUCOSAL

## 2020-01-28 MED ORDER — ORAL CARE MOUTH RINSE: 15.0000 mL | Freq: Once | OROMUCOSAL | Status: AC

## 2020-01-28 MED ORDER — ONABOTULINUMTOXINA 100 UNITS IJ SOLR
INTRAMUSCULAR | Status: DC | PRN
  Administered 2020-01-28 (×2): 100 [IU] via INTRAMUSCULAR

## 2020-01-28 MED ORDER — ONABOTULINUMTOXINA 100 UNITS IJ SOLR
INTRAMUSCULAR | Status: AC
  Filled 2020-01-28: qty 200

## 2020-01-28 MED ORDER — SODIUM CHLORIDE (PF) 0.9 % IJ SOLN
INTRAMUSCULAR | Status: AC
  Filled 2020-01-28: qty 20

## 2020-01-28 MED ORDER — FENTANYL CITRATE (PF) 100 MCG/2ML IJ SOLN: 25.0000 ug | INTRAMUSCULAR | Status: DC | PRN

## 2020-01-28 MED ORDER — SODIUM CHLORIDE 0.9 % IV SOLN
2.0000 g | Freq: Once | INTRAVENOUS | Status: AC
  Administered 2020-01-28: 2 g via INTRAVENOUS
  Filled 2020-01-28 (×2): qty 2

## 2020-01-28 MED ORDER — PROPOFOL 10 MG/ML IV BOLUS
INTRAVENOUS | Status: DC | PRN
  Administered 2020-01-28: 200 mg via INTRAVENOUS

## 2020-01-28 MED ORDER — STERILE WATER FOR IRRIGATION IR SOLN
Status: DC | PRN
  Administered 2020-01-28: 3000 mL

## 2020-01-28 MED ORDER — SODIUM CHLORIDE 0.9 % IV SOLN: 2.0000 g | Freq: Once | INTRAVENOUS | Status: DC

## 2020-01-28 MED ORDER — ONDANSETRON HCL 4 MG/2ML IJ SOLN
INTRAMUSCULAR | Status: DC | PRN
  Administered 2020-01-28: 4 mg via INTRAVENOUS

## 2020-01-28 MED ORDER — SODIUM CHLORIDE (PF) 0.9 % IJ SOLN
INTRAMUSCULAR | Status: AC
  Filled 2020-01-28: qty 10

## 2020-01-28 MED ORDER — NITROFURANTOIN MONOHYD MACRO 100 MG PO CAPS: 100.0000 mg | ORAL_CAPSULE | Freq: Every day | ORAL | 0 refills | Status: DC

## 2020-01-28 MED ORDER — MIDAZOLAM HCL 2 MG/2ML IJ SOLN
INTRAMUSCULAR | Status: AC
  Filled 2020-01-28: qty 2

## 2020-01-28 MED ORDER — LACTATED RINGERS IV SOLN: INTRAVENOUS | Status: DC

## 2020-01-28 MED ORDER — LIDOCAINE 2% (20 MG/ML) 5 ML SYRINGE
INTRAMUSCULAR | Status: AC
  Filled 2020-01-28: qty 5

## 2020-01-28 MED ORDER — ONDANSETRON HCL 4 MG/2ML IJ SOLN
INTRAMUSCULAR | Status: AC
  Filled 2020-01-28: qty 2

## 2020-01-28 MED ORDER — FENTANYL CITRATE (PF) 100 MCG/2ML IJ SOLN
INTRAMUSCULAR | Status: DC | PRN
Start: 2020-01-28 — End: 2020-01-28
  Administered 2020-01-28 (×2): 25 ug via INTRAVENOUS

## 2020-01-28 MED ORDER — DEXAMETHASONE SODIUM PHOSPHATE 10 MG/ML IJ SOLN
INTRAMUSCULAR | Status: AC
  Filled 2020-01-28: qty 1

## 2020-01-28 MED ORDER — MIDAZOLAM HCL 5 MG/5ML IJ SOLN
INTRAMUSCULAR | Status: DC | PRN
  Administered 2020-01-28: 2 mg via INTRAVENOUS

## 2020-01-28 MED ORDER — ONABOTULINUMTOXINA 100 UNITS IJ SOLR
INTRAMUSCULAR | Status: AC
  Filled 2020-01-28: qty 100

## 2020-01-28 MED ORDER — SODIUM CHLORIDE (PF) 0.9 % IJ SOLN
INTRAMUSCULAR | Status: DC | PRN
  Administered 2020-01-28 (×3): 10 mL

## 2020-01-28 MED ORDER — LIDOCAINE 2% (20 MG/ML) 5 ML SYRINGE
INTRAMUSCULAR | Status: DC | PRN
  Administered 2020-01-28: 60 mg via INTRAVENOUS

## 2020-01-28 MED ORDER — DEXAMETHASONE SODIUM PHOSPHATE 10 MG/ML IJ SOLN
INTRAMUSCULAR | Status: DC | PRN
  Administered 2020-01-28: 5 mg via INTRAVENOUS

## 2020-01-28 SURGICAL SUPPLY — 15 items
BAG URO CATCHER STRL LF (MISCELLANEOUS) ×3 IMPLANT
CLOTH BEACON ORANGE TIMEOUT ST (SAFETY) ×3 IMPLANT
GLOVE BIOGEL M STRL SZ7.5 (GLOVE) ×3 IMPLANT
GOWN STRL REUS W/TWL XL LVL3 (GOWN DISPOSABLE) ×3 IMPLANT
KIT TURNOVER KIT A (KITS) IMPLANT
MANIFOLD NEPTUNE II (INSTRUMENTS) ×3 IMPLANT
NDL SAFETY ECLIPSE 18X1.5 (NEEDLE) ×1 IMPLANT
NEEDLE ASPIRATION 22 (NEEDLE) ×3 IMPLANT
NEEDLE HYPO 18GX1.5 SHARP (NEEDLE) ×3
PACK CYSTO (CUSTOM PROCEDURE TRAY) ×3 IMPLANT
PENCIL SMOKE EVACUATOR (MISCELLANEOUS) IMPLANT
SYR CONTROL 10ML LL (SYRINGE) ×3 IMPLANT
TUBING CONNECTING 10 (TUBING) ×2 IMPLANT
TUBING CONNECTING 10' (TUBING) ×1
WATER STERILE IRR 3000ML UROMA (IV SOLUTION) ×3 IMPLANT

## 2020-01-28 NOTE — Anesthesia Postprocedure Evaluation (Signed)
Anesthesia Post Note  Patient: Ryan Watkins  Procedure(s) Performed: CYSTOSCOPY WITH BOTOX INJECTION 300 UNITS, CYSTOLYTHOPXY (N/A )     Patient location during evaluation: PACU Anesthesia Type: General Level of consciousness: awake and alert Pain management: pain level controlled Vital Signs Assessment: post-procedure vital signs reviewed and stable Respiratory status: spontaneous breathing, nonlabored ventilation, respiratory function stable and patient connected to nasal cannula oxygen Cardiovascular status: blood pressure returned to baseline and stable Postop Assessment: no apparent nausea or vomiting Anesthetic complications: no   No complications documented.  Last Vitals:  Vitals:   01/28/20 1345 01/28/20 1400  BP: (!) 130/100 124/87  Pulse: 65 81  Resp: 19 18  Temp: 36.5 C   SpO2: 100% 98%    Last Pain:  Vitals:   01/28/20 1138  TempSrc: Oral                 Amyra Vantuyl S

## 2020-01-28 NOTE — Transfer of Care (Signed)
Immediate Anesthesia Transfer of Care Note  Patient: Ryan Watkins  Procedure(s) Performed: CYSTOSCOPY WITH BOTOX INJECTION 300 UNITS, CYSTOLYTHOPXY (N/A )  Patient Location: PACU  Anesthesia Type:General  Level of Consciousness: awake, alert  and oriented  Airway & Oxygen Therapy: Patient Spontanous Breathing and Patient connected to face mask oxygen  Post-op Assessment: Report given to RN and Post -op Vital signs reviewed and stable  Post vital signs: Reviewed and stable  Last Vitals:  Vitals Value Taken Time  BP 139/96 01/28/20 1342  Temp    Pulse 74 01/28/20 1343  Resp 10 01/28/20 1343  SpO2 100 % 01/28/20 1343  Vitals shown include unvalidated device data.  Last Pain:  Vitals:   01/28/20 1138  TempSrc: Oral         Complications: No complications documented.

## 2020-01-28 NOTE — Op Note (Signed)
Preoperative diagnosis: Neurogenic bladder Postoperative diagnosis: Neurogenic bladder, bladder stone  Procedure: Cystoscopy, Botox injection 300 units, cystolitholopaxy less than 2 cm  Surgeon: Mena Goes  Anesthesia: General  Indication for procedure: Ryan Watkins is a 34 year old male with neurogenic bladder who does CIC.  He has been having more autonomic dysreflexia symptoms and ready for more Botox.  Findings: On cystoscopy there was about a 2 cm bladder stone in the bladder.  Otherwise bladder unremarkable.  Description of procedure: After consent was obtained patient brought to the operating room.  After adequate anesthesia he was placed in lithotomy position and prepped and draped in the usual sterile fashion.  A timeout was performed to confirm the patient and procedure.  The cystoscope was passed per urethra and the bladder inspected.  I irrigated the bladder for 5 times to clear it out of any debris or cloudiness.  There was a small stone in the bladder.  The bladder was then filled to about 200 cc and 300 units of Botox was injected at 10 units/cc with 30 injections.  Injections were done in the typical grid pattern.  Typical bleeding from the injection sites resolved quickly.  Attention was then turned to the bladder stone where the rigid biopsy forceps were inserted and the stone was crushed and fragmented with the biopsy forceps.  All the fragments were evacuated.  Again hemostasis was excellent and the scope was backed out.  A 16 French Foley was placed in left to gravity drainage.  Patient had requested Foley for a couple of days as he has some travel to do and will have some bladder issues after the Botox injections.  He was then awakened and taken to the recovery room in stable condition.  Complications: None  Blood loss: Minimal  Specimens: None  Drains: 16 French Foley catheter  Disposition: Patient stable to PACU

## 2020-01-28 NOTE — Anesthesia Procedure Notes (Signed)
Procedure Name: LMA Insertion Date/Time: 01/28/2020 12:50 PM Performed by: Elyn Peers, CRNA Pre-anesthesia Checklist: Patient identified, Emergency Drugs available, Suction available, Patient being monitored and Timeout performed Patient Re-evaluated:Patient Re-evaluated prior to induction Oxygen Delivery Method: Circle system utilized Preoxygenation: Pre-oxygenation with 100% oxygen Induction Type: IV induction Ventilation: Mask ventilation without difficulty LMA: LMA inserted LMA Size: 4.0 Number of attempts: 1 Placement Confirmation: positive ETCO2 and breath sounds checked- equal and bilateral Tube secured with: Tape Dental Injury: Teeth and Oropharynx as per pre-operative assessment

## 2020-01-28 NOTE — Anesthesia Preprocedure Evaluation (Signed)
Anesthesia Evaluation  Patient identified by MRN, date of birth, ID band Patient awake    Reviewed: Allergy & Precautions, NPO status , Patient's Chart, lab work & pertinent test results  Airway Mallampati: II  TM Distance: >3 FB Neck ROM: Full    Dental no notable dental hx.    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   GI/Hepatic negative GI ROS, Neg liver ROS,   Endo/Other  negative endocrine ROS  Renal/GU negative Renal ROS  negative genitourinary   Musculoskeletal negative musculoskeletal ROS (+)   Abdominal   Peds negative pediatric ROS (+)  Hematology negative hematology ROS (+)   Anesthesia Other Findings   Reproductive/Obstetrics negative OB ROS                             Anesthesia Physical Anesthesia Plan  ASA: III  Anesthesia Plan: General   Post-op Pain Management:    Induction: Intravenous  PONV Risk Score and Plan: 2 and Ondansetron, Dexamethasone and Treatment may vary due to age or medical condition  Airway Management Planned: LMA  Additional Equipment:   Intra-op Plan:   Post-operative Plan: Extubation in OR  Informed Consent: I have reviewed the patients History and Physical, chart, labs and discussed the procedure including the risks, benefits and alternatives for the proposed anesthesia with the patient or authorized representative who has indicated his/her understanding and acceptance.     Dental advisory given  Plan Discussed with: CRNA and Surgeon  Anesthesia Plan Comments:         Anesthesia Quick Evaluation

## 2020-01-28 NOTE — Discharge Instructions (Signed)
Indwelling Urinary Catheter Care, Adult An indwelling urinary catheter is a thin tube that is put into your bladder. The tube helps to drain pee (urine) out of your body. The tube goes in through your urethra. Your urethra is where pee comes out of your body. Your pee will come out through the catheter, then it will go into a bag (drainage bag). Take good care of your catheter so it will work well.  Keep the catheter for about 3 days, okay to remove the catheter on Monday morning.  This will allow the bladder to heal from the Botox and stone removal.  How to wear your catheter and bag Supplies needed  Sticky tape (adhesive tape) or a leg strap.  Alcohol wipe or soap and water (if you use tape).  A clean towel (if you use tape).  Large overnight bag.  Smaller bag (leg bag). Wearing your catheter Attach your catheter to your leg with tape or a leg strap.  Make sure the catheter is not pulled tight.  If a leg strap gets wet, take it off and put on a dry strap.  If you use tape to hold the bag on your leg: 1. Use an alcohol wipe or soap and water to wash your skin where the tape made it sticky before. 2. Use a clean towel to pat-dry that skin. 3. Use new tape to make the bag stay on your leg. Wearing your bags You should have been given a large overnight bag.  You may wear the overnight bag in the day or night.  Always have the overnight bag lower than your bladder.  Do not let the bag touch the floor.  Before you go to sleep, put a clean plastic bag in a wastebasket. Then hang the overnight bag inside the wastebasket. You should also have a smaller leg bag that fits under your clothes.  Always wear the leg bag below your knee.  Do not wear your leg bag at night. How to care for your skin and catheter Supplies needed  A clean washcloth.  Water and mild soap.  A clean towel. Caring for your skin and catheter      Clean the skin around your catheter every  day: 1. Wash your hands with soap and water. 2. Wet a clean washcloth in warm water and mild soap. 3. Clean the skin around your urethra.  If you are male:  Gently spread the folds of skin around your vagina (labia).  With the washcloth in your other hand, wipe the inner side of your labia on each side. Wipe from front to back.  If you are male:  Pull back any skin that covers the end of your penis (foreskin).  With the washcloth in your other hand, wipe your penis in small circles. Start wiping at the tip of your penis, then move away from the catheter.  Move the foreskin back in place, if needed. 4. With your free hand, hold the catheter close to where it goes into your body.  Keep holding the catheter during cleaning so it does not get pulled out. 5. With the washcloth in your other hand, clean the catheter.  Only wipe downward on the catheter.  Do not wipe upward toward your body. Doing this may push germs into your urethra and cause infection. 6. Use a clean towel to pat-dry the catheter and the skin around it. Make sure to wipe off all soap. 7. Wash your hands with soap and water.  Shower every day. Do not take baths.  Do not use cream, ointment, or lotion on the area where the catheter goes into your body, unless your doctor tells you to.  Do not use powders, sprays, or lotions on your genital area.  Check your skin around the catheter every day for signs of infection. Check for: ? Redness, swelling, or pain. ? Fluid or blood. ? Warmth. ? Pus or a bad smell. How to empty the bag Supplies needed  Rubbing alcohol.  Gauze pad or cotton ball.  Tape or a leg strap. Emptying the bag Pour the pee out of your bag when it is ?- full, or at least 2-3 times a day. Do this for your overnight bag and your leg bag. 1. Wash your hands with soap and water. 2. Separate (detach) the bag from your leg. 3. Hold the bag over the toilet or a clean pail. Keep the bag lower than  your hips and bladder. This is so the pee (urine) does not go back into the tube. 4. Open the pour spout. It is at the bottom of the bag. 5. Empty the pee into the toilet or pail. Do not let the pour spout touch any surface. 6. Put rubbing alcohol on a gauze pad or cotton ball. 7. Use the gauze pad or cotton ball to clean the pour spout. 8. Close the pour spout. 9. Attach the bag to your leg with tape or a leg strap. 10. Wash your hands with soap and water. Follow instructions for cleaning the drainage bag:  From the product maker.  As told by your doctor. How to change the bag Supplies needed  Alcohol wipes.  A clean bag.  Tape or a leg strap. Changing the bag Replace your bag when it starts to leak, smell bad, or look dirty. 1. Wash your hands with soap and water. 2. Separate the dirty bag from your leg. 3. Pinch the catheter with your fingers so that pee does not spill out. 4. Separate the catheter tube from the bag tube where these tubes connect (at the connection valve). Do not let the tubes touch any surface. 5. Clean the end of the catheter tube with an alcohol wipe. Use a different alcohol wipe to clean the end of the bag tube. 6. Connect the catheter tube to the tube of the clean bag. 7. Attach the clean bag to your leg with tape or a leg strap. Do not make the bag tight on your leg. 8. Wash your hands with soap and water. General rules   Never pull on your catheter. Never try to take it out. Doing that can hurt you.  Always wash your hands before and after you touch your catheter or bag. Use a mild, fragrance-free soap. If you do not have soap and water, use hand sanitizer.  Always make sure there are no twists or bends (kinks) in the catheter tube.  Always make sure there are no leaks in the catheter or bag.  Drink enough fluid to keep your pee pale yellow.  Do not take baths, swim, or use a hot tub.  If you are male, wipe from front to back after you poop  (have a bowel movement). Contact a doctor if:  Your pee is cloudy.  Your pee smells worse than usual.  Your catheter gets clogged.  Your catheter leaks.  Your bladder feels full. Get help right away if:  You have redness, swelling, or pain where the catheter goes  into your body.  You have fluid, blood, pus, or a bad smell coming from the area where the catheter goes into your body.  Your skin feels warm where the catheter goes into your body.  You have a fever.  You have pain in your: ? Belly (abdomen). ? Legs. ? Lower back. ? Bladder.  You see blood in the catheter.  Your pee is pink or red.  You feel sick to your stomach (nauseous).  You throw up (vomit).  You have chills.  Your pee is not draining into the bag.  Your catheter gets pulled out. Summary  An indwelling urinary catheter is a thin tube that is placed into the bladder to help drain pee (urine) out of the body.  The catheter is placed into the part of the body that drains pee from the bladder (urethra).  Taking good care of your catheter will keep it working properly and help prevent problems.  Always wash your hands before and after touching your catheter or bag.  Never pull on your catheter or try to take it out. This information is not intended to replace advice given to you by your health care provider. Make sure you discuss any questions you have with your health care provider. Document Revised: 08/14/2018 Document Reviewed: 12/06/2016 Elsevier Patient Education  2020 [employer].

## 2020-01-28 NOTE — Interval H&P Note (Signed)
History and Physical Interval Note:  01/28/2020 12:23 PM  Ryan Watkins  has presented today for surgery, with the diagnosis of NEUROGENIC BLADDER.  The various methods of treatment have been discussed with the patient and family. After consideration of risks, benefits and other options for treatment, the patient has consented to  Procedure(s): CYSTOSCOPY WITH BOTOX INJECTION (N/A) as a surgical intervention.  The patient's history has been reviewed, patient examined, no change in status, stable for surgery.  I have reviewed the patient's chart and labs.  He is taking the NF. No fever. Needs a foley over the weekend. They are expecting twin girls!!! Questions were answered to the patient's satisfaction.      [name]

## 2020-01-29 ENCOUNTER — Encounter (HOSPITAL_COMMUNITY): Payer: Self-pay | Admitting: Urology

## 2020-07-21 ENCOUNTER — Other Ambulatory Visit: Payer: Self-pay | Admitting: Urology

## 2020-07-25 ENCOUNTER — Other Ambulatory Visit: Payer: Self-pay | Admitting: Urology

## 2020-07-25 DIAGNOSIS — N200 Calculus of kidney: Secondary | ICD-10-CM

## 2020-08-03 ENCOUNTER — Ambulatory Visit
Admission: RE | Admit: 2020-08-03 | Discharge: 2020-08-03 | Disposition: A | Discharge status: Home / Self Care | Payer: Medicare Other | Source: Ambulatory Visit | Attending: Urology | Admitting: Urology

## 2020-08-03 ENCOUNTER — Other Ambulatory Visit: Payer: Self-pay | Admitting: Urology

## 2020-08-03 DIAGNOSIS — N200 Calculus of kidney: Secondary | ICD-10-CM

## 2020-08-04 NOTE — Patient Instructions (Signed)
DUE TO COVID-19 ONLY ONE VISITOR IS ALLOWED TO COME WITH YOU AND STAY IN THE WAITING ROOM ONLY DURING PRE OP AND PROCEDURE DAY OF SURGERY. THE 1 VISITOR  MAY VISIT WITH YOU AFTER SURGERY IN YOUR PRIVATE ROOM DURING VISITING HOURS ONLY!  YOU NEED TO HAVE A COVID 19 TEST ON: 08/11/20 @              , THIS TEST MUST BE DONE BEFORE SURGERY,  COVID TESTING SITE [address] [place] [address], IT IS ON THE RIGHT GOING OUT [address] APPROXIMATELY  2 MINUTES PAST [employer] ON THE RIGHT. ONCE YOUR COVID TEST IS COMPLETED,  PLEASE BEGIN THE QUARANTINE INSTRUCTIONS AS OUTLINED IN YOUR HANDOUT.                Ryan Watkins    Your procedure is scheduled on: 08/15/20   Report to [facility] Main  Entrance   Report to admitting at: 9:00 AM     Call this number if you have problems the morning of surgery [phone]    Remember: Do not eat solid food :After Midnight. Clear liquids until: 8:00 am.  CLEAR LIQUID DIET  Foods Allowed                                                                     Foods Excluded  Coffee and tea, regular and decaf                             liquids that you cannot  Plain Jell-O any favor except red or purple                                           see through such as: Fruit ices (not with fruit pulp)                                     milk, soups, orange juice  Iced Popsicles                                    All solid food Carbonated beverages, regular and diet                                    Cranberry, grape and apple juices Sports drinks like Gatorade Lightly seasoned clear broth or consume(fat free) Sugar, honey syrup  Sample Menu Breakfast                                Lunch                                     Supper Cranberry juice  Beef broth                            Chicken broth Jell-O                                     Grape juice                           Apple juice Coffee or tea                         Jell-O                                      Popsicle                                                Coffee or tea                        Coffee or tea  _____________________________________________________________________  BRUSH YOUR TEETH MORNING OF SURGERY AND RINSE YOUR MOUTH OUT, NO CHEWING GUM CANDY OR MINTS.    Take these medicines the morning of surgery with A SIP OF WATER: loratadine.Use flonase as usual                               You may not have any metal on your body including hair pins and              piercings  Do not wear jewelry, lotions, powders or perfumes, deodorant             Men may shave face and neck.   Do not bring valuables to the hospital. [place] IS NOT             RESPONSIBLE   FOR VALUABLES.  Contacts, dentures or bridgework may not be worn into surgery.  Leave suitcase in the car. After surgery it may be brought to your room.     Patients discharged the day of surgery will not be allowed to drive home. IF YOU ARE HAVING SURGERY AND GOING HOME THE SAME DAY, YOU MUST HAVE AN ADULT TO DRIVE YOU HOME AND BE WITH YOU FOR 24 HOURS. YOU MAY GO HOME BY TAXI OR UBER OR ORTHERWISE, BUT AN ADULT MUST ACCOMPANY YOU HOME AND STAY WITH YOU FOR 24 HOURS.  Name and phone number of your driver:  Special Instructions: N/A              Please read over the following fact sheets you were given: _____________________________________________________________________         [facility] - Preparing for Surgery Before surgery, you can play an important role.  Because skin is not sterile, your skin needs to be as free of germs as possible.  You can reduce the number of germs on your skin by washing with CHG (chlorahexidine gluconate) soap before surgery.  CHG is an antiseptic cleaner which kills germs and bonds with the skin to continue killing  germs even after washing. Please DO NOT use if you have an allergy to CHG or antibacterial soaps.  If your skin  becomes reddened/irritated stop using the CHG and inform your nurse when you arrive at Short Stay. Do not shave (including legs and underarms) for at least 48 hours prior to the first CHG shower.  You may shave your face/neck. Please follow these instructions carefully:  1.  Shower with CHG Soap the night before surgery and the  morning of Surgery.  2.  If you choose to wash your hair, wash your hair first as usual with your  normal  shampoo.  3.  After you shampoo, rinse your hair and body thoroughly to remove the  shampoo.                           4.  Use CHG as you would any other liquid soap.  You can apply chg directly  to the skin and wash                       Gently with a scrungie or clean washcloth.  5.  Apply the CHG Soap to your body ONLY FROM THE NECK DOWN.   Do not use on face/ open                           Wound or open sores. Avoid contact with eyes, ears mouth and genitals (private parts).                       Wash face,  Genitals (private parts) with your normal soap.             6.  Wash thoroughly, paying special attention to the area where your surgery  will be performed.  7.  Thoroughly rinse your body with warm water from the neck down.  8.  DO NOT shower/wash with your normal soap after using and rinsing off  the CHG Soap.                9.  Pat yourself dry with a clean towel.            10.  Wear clean pajamas.            11.  Place clean sheets on your bed the night of your first shower and do not  sleep with pets. Day of Surgery : Do not apply any lotions/deodorants the morning of surgery.  Please wear clean clothes to the hospital/surgery center.  FAILURE TO FOLLOW THESE INSTRUCTIONS MAY RESULT IN THE CANCELLATION OF YOUR SURGERY PATIENT SIGNATURE_________________________________  NURSE SIGNATURE__________________________________  ________________________________________________________________________

## 2020-08-07 ENCOUNTER — Encounter (HOSPITAL_COMMUNITY)
Admission: RE | Admit: 2020-08-07 | Discharge: 2020-08-07 | Disposition: A | Discharge status: Home / Self Care | Payer: Medicare Other | Source: Ambulatory Visit | Attending: Anesthesiology | Admitting: Anesthesiology

## 2020-08-07 ENCOUNTER — Other Ambulatory Visit: Payer: Self-pay | Admitting: Urology

## 2020-08-10 ENCOUNTER — Inpatient Hospital Stay (HOSPITAL_COMMUNITY): Admission: RE | Admit: 2020-08-10 | Payer: Medicare Other | Source: Ambulatory Visit

## 2020-08-10 ENCOUNTER — Other Ambulatory Visit: Payer: Self-pay

## 2020-08-10 ENCOUNTER — Encounter (HOSPITAL_COMMUNITY)
Admission: RE | Admit: 2020-08-10 | Discharge: 2020-08-10 | Disposition: A | Discharge status: Home / Self Care | Payer: Medicare Other | Source: Ambulatory Visit | Attending: Urology | Admitting: Urology

## 2020-08-10 ENCOUNTER — Encounter (HOSPITAL_COMMUNITY): Payer: Self-pay

## 2020-08-10 DIAGNOSIS — Z01812 Encounter for preprocedural laboratory examination: Secondary | ICD-10-CM | POA: Diagnosis present

## 2020-08-10 LAB — CBC
HCT: 32.9 % — ABNORMAL LOW (ref 39.0–52.0)
Hemoglobin: 10.7 g/dL — ABNORMAL LOW (ref 13.0–17.0)
MCH: 28.2 pg (ref 26.0–34.0)
MCHC: 32.5 g/dL (ref 30.0–36.0)
MCV: 86.6 fL (ref 80.0–100.0)
Platelets: 231 10*3/uL (ref 150–400)
RBC: 3.8 MIL/uL — ABNORMAL LOW (ref 4.22–5.81)
RDW: 13.8 % (ref 11.5–15.5)
WBC: 10.8 10*3/uL — ABNORMAL HIGH (ref 4.0–10.5)
nRBC: 0 % (ref 0.0–0.2)

## 2020-08-10 NOTE — Progress Notes (Signed)
COVID Vaccine Completed: x2 Date COVID Vaccine completed:  12-11-19 01-01-20 Has received booster: COVID vaccine manufacturer: Pfizer      Date of COVID positive in last 90 days: N/A  PCP - Delano Metz, MD Cardiologist - N/A  Chest x-ray - N/A EKG - N/A Stress Test -  ECHO -  Cardiac Cath -  Pacemaker/ICD device last checked: Spinal Cord Stimulator:  Sleep Study - N/A CPAP -   Fasting Blood Sugar - N/A Checks Blood Sugar _____ times a day  Blood Thinner Instructions: N/A Aspirin Instructions: Last Dose:  Activity level:  Unable to go up a flight of stairs due to quadraplegia      Anesthesia review:   Patient denies shortness of breath, fever, cough and chest pain at PAT appointment   Patient verbalized understanding of instructions that were given to them at the PAT appointment. Patient was also instructed that they will need to review over the PAT instructions again at home before surgery.

## 2020-08-10 NOTE — Progress Notes (Signed)
CBC results sent to Dr. Mena Goes to review.

## 2020-08-10 NOTE — Patient Instructions (Addendum)
DUE TO COVID-19 ONLY ONE VISITOR IS ALLOWED TO COME WITH YOU AND STAY IN THE WAITING ROOM ONLY DURING PRE OP AND PROCEDURE.   **NO VISITORS ARE ALLOWED IN THE SHORT STAY AREA OR RECOVERY ROOM!!**    COVID SWAB TESTING MUST BE COMPLETED ON:  08-11-20 @ 11:20 AM   [address] [place], [place] [address]  (Must self quarantine after testing. Follow instructions on handout.)   Your procedure is scheduled on: Tuesday, 08-15-20    Report to [facility] Main  Entrance   Report to admitting at 9:00 AM   Call this number if you have problems the morning of surgery [phone]   Do not eat food :After Midnight.   May have liquids until 8:00 AM day of surgery  CLEAR LIQUID DIET  Foods Allowed                                                                     Foods Excluded  Water, Black Coffee and tea, regular and decaf            liquids that you cannot  Plain Jell-O in any flavor  (No red)                                   see through such as: Fruit ices (not with fruit pulp)                                      milk, soups, orange juice              Iced Popsicles (No red)                                      All solid food                                   Apple juices Sports drinks like Gatorade (No red) Lightly seasoned clear broth or consume(fat free) Sugar, honey syrup      Oral Hygiene is also important to reduce your risk of infection.                                    Remember - BRUSH YOUR TEETH THE MORNING OF SURGERY WITH YOUR REGULAR TOOTHPASTE   Do NOT smoke after Midnight   Take these medicines the morning of surgery with A SIP OF WATER:  Loratadine, Flomas                               You may not have any metal on your body including jewelry, and body piercings             Do not wear lotions, powders, perfumes/cologne, or deodorant  Men may shave face and neck.   Do not bring valuables to the hospital. [place].   Contacts, dentures or bridgework may not be worn into surgery.   Patients discharged the day of surgery will not be allowed to drive home.                Please read over the following fact sheets you were given: IF YOU HAVE QUESTIONS ABOUT YOUR PRE OP INSTRUCTIONS PLEASE CALL  [place] - Preparing for Surgery Before surgery, you can play an important role.  Because skin is not sterile, your skin needs to be as free of germs as possible.  You can reduce the number of germs on your skin by washing with CHG (chlorahexidine gluconate) soap before surgery.  CHG is an antiseptic cleaner which kills germs and bonds with the skin to continue killing germs even after washing. Please DO NOT use if you have an allergy to CHG or antibacterial soaps.  If your skin becomes reddened/irritated stop using the CHG and inform your nurse when you arrive at Short Stay. Do not shave (including legs and underarms) for at least 48 hours prior to the first CHG shower.  You may shave your face/neck.  Please follow these instructions carefully:  1.  Shower with CHG Soap the night before surgery and the  morning of surgery.  2.  If you choose to wash your hair, wash your hair first as usual with your normal  shampoo.  3.  After you shampoo, rinse your hair and body thoroughly to remove the shampoo.                             4.  Use CHG as you would any other liquid soap.  You can apply chg directly to the skin and wash.  Gently with a scrungie or clean washcloth.  5.  Apply the CHG Soap to your body ONLY FROM THE NECK DOWN.   Do   not use on face/ open                           Wound or open sores. Avoid contact with eyes, ears mouth and   genitals (private parts).                       Wash face,  Genitals (private parts) with your normal soap.             6.  Wash thoroughly, paying special attention to the area where your    surgery  will be performed.  7.   Thoroughly rinse your body with warm water from the neck down.  8.  DO NOT shower/wash with your normal soap after using and rinsing off the CHG Soap.                9.  Pat yourself dry with a clean towel.            10.  Wear clean pajamas.            11.  Place clean sheets on your bed the night of your first shower and do not  sleep with pets. Day of Surgery : Do not apply any lotions/deodorants the morning  of surgery.  Please wear clean clothes to the hospital/surgery center.  FAILURE TO FOLLOW THESE INSTRUCTIONS MAY RESULT IN THE CANCELLATION OF YOUR SURGERY  PATIENT SIGNATURE_________________________________  NURSE SIGNATURE__________________________________  ________________________________________________________________________

## 2020-08-11 ENCOUNTER — Other Ambulatory Visit (HOSPITAL_COMMUNITY)
Admission: RE | Admit: 2020-08-11 | Discharge: 2020-08-11 | Disposition: A | Discharge status: Home / Self Care | Payer: Medicare Other | Source: Ambulatory Visit | Attending: Urology | Admitting: Urology

## 2020-08-11 DIAGNOSIS — Z01812 Encounter for preprocedural laboratory examination: Secondary | ICD-10-CM | POA: Diagnosis present

## 2020-08-11 DIAGNOSIS — Z20822 Contact with and (suspected) exposure to covid-19: Secondary | ICD-10-CM | POA: Diagnosis not present

## 2020-08-11 LAB — SARS CORONAVIRUS 2 (TAT 6-24 HRS): SARS Coronavirus 2: NEGATIVE

## 2020-08-14 NOTE — Anesthesia Preprocedure Evaluation (Addendum)
Anesthesia Evaluation  Patient identified by MRN, date of birth, ID band Patient awake    Reviewed: Allergy & Precautions, NPO status , Patient's Chart, lab work & pertinent test results  Airway Mallampati: II  TM Distance: >3 FB Neck ROM: Full    Dental no notable dental hx. (+) Teeth Intact, Dental Advisory Given   Pulmonary neg pulmonary ROS,    Pulmonary exam normal breath sounds clear to auscultation       Cardiovascular Exercise Tolerance: Good Normal cardiovascular exam Rhythm:Regular Rate:Normal     Neuro/Psych C4 Quadraplegia negative neurological ROS     GI/Hepatic negative GI ROS, Neg liver ROS,   Endo/Other  negative endocrine ROS  Renal/GU negative Renal ROS     Musculoskeletal negative musculoskeletal ROS (+)   Abdominal   Peds  Hematology  (+) anemia , Lab Results      Component                Value               Date                      WBC                      10.8 (H)            08/10/2020                HGB                      10.7 (L)            08/10/2020                HCT                      32.9 (L)            08/10/2020                MCV                      86.6                08/10/2020                PLT                      231                 08/10/2020              Anesthesia Other Findings   Reproductive/Obstetrics                            Anesthesia Physical Anesthesia Plan  ASA: III  Anesthesia Plan: General   Post-op Pain Management:    Induction: Intravenous  PONV Risk Score and Plan: 3 and Treatment may vary due to age or medical condition, Ondansetron, Dexamethasone and Midazolam  Airway Management Planned: LMA  Additional Equipment: None  Intra-op Plan:   Post-operative Plan:   Informed Consent: I have reviewed the patients History and Physical, chart, labs and discussed the procedure including the risks, benefits and  alternatives for the proposed anesthesia with the patient or authorized representative who has indicated his/her understanding and acceptance.  Dental advisory given  Plan Discussed with: CRNA and Anesthesiologist  Anesthesia Plan Comments:        Anesthesia Quick Evaluation
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
## 2020-08-15 ENCOUNTER — Encounter (HOSPITAL_COMMUNITY): Payer: Self-pay | Admitting: Urology

## 2020-08-15 ENCOUNTER — Encounter (HOSPITAL_COMMUNITY)
Admission: RE | Disposition: A | Discharge status: Home / Self Care | Payer: Self-pay | Source: Other Acute Inpatient Hospital | Attending: Urology

## 2020-08-15 ENCOUNTER — Ambulatory Visit (HOSPITAL_COMMUNITY)
Admission: RE | Admit: 2020-08-15 | Discharge: 2020-08-15 | Disposition: A | Discharge status: Home / Self Care | Payer: Commercial Managed Care - PPO | Source: Other Acute Inpatient Hospital | Attending: Urology | Admitting: Urology

## 2020-08-15 ENCOUNTER — Ambulatory Visit (HOSPITAL_COMMUNITY): Payer: Commercial Managed Care - PPO | Admitting: Physician Assistant

## 2020-08-15 ENCOUNTER — Ambulatory Visit (HOSPITAL_COMMUNITY): Payer: Commercial Managed Care - PPO

## 2020-08-15 ENCOUNTER — Ambulatory Visit (HOSPITAL_COMMUNITY): Payer: Commercial Managed Care - PPO | Admitting: Anesthesiology

## 2020-08-15 DIAGNOSIS — Z885 Allergy status to narcotic agent status: Secondary | ICD-10-CM | POA: Diagnosis not present

## 2020-08-15 DIAGNOSIS — G8220 Paraplegia, unspecified: Secondary | ICD-10-CM | POA: Diagnosis not present

## 2020-08-15 DIAGNOSIS — N200 Calculus of kidney: Secondary | ICD-10-CM | POA: Diagnosis not present

## 2020-08-15 DIAGNOSIS — Z791 Long term (current) use of non-steroidal anti-inflammatories (NSAID): Secondary | ICD-10-CM | POA: Insufficient documentation

## 2020-08-15 DIAGNOSIS — Z801 Family history of malignant neoplasm of trachea, bronchus and lung: Secondary | ICD-10-CM | POA: Insufficient documentation

## 2020-08-15 DIAGNOSIS — G904 Autonomic dysreflexia: Secondary | ICD-10-CM | POA: Insufficient documentation

## 2020-08-15 DIAGNOSIS — Z8249 Family history of ischemic heart disease and other diseases of the circulatory system: Secondary | ICD-10-CM | POA: Diagnosis not present

## 2020-08-15 DIAGNOSIS — Z79899 Other long term (current) drug therapy: Secondary | ICD-10-CM | POA: Diagnosis not present

## 2020-08-15 DIAGNOSIS — Z8049 Family history of malignant neoplasm of other genital organs: Secondary | ICD-10-CM | POA: Insufficient documentation

## 2020-08-15 DIAGNOSIS — N3020 Other chronic cystitis without hematuria: Secondary | ICD-10-CM | POA: Diagnosis not present

## 2020-08-15 DIAGNOSIS — Z808 Family history of malignant neoplasm of other organs or systems: Secondary | ICD-10-CM | POA: Insufficient documentation

## 2020-08-15 DIAGNOSIS — Z87442 Personal history of urinary calculi: Secondary | ICD-10-CM | POA: Insufficient documentation

## 2020-08-15 DIAGNOSIS — N319 Neuromuscular dysfunction of bladder, unspecified: Secondary | ICD-10-CM | POA: Insufficient documentation

## 2020-08-15 HISTORY — PX: BOTOX INJECTION: SHX5754

## 2020-08-15 HISTORY — PX: CYSTOSCOPY/URETEROSCOPY/HOLMIUM LASER/STENT PLACEMENT: SHX6546

## 2020-08-15 SURGERY — BOTOX INJECTION
Anesthesia: General | Laterality: Right

## 2020-08-15 MED ORDER — MIDAZOLAM HCL 2 MG/2ML IJ SOLN
INTRAMUSCULAR | Status: AC
  Filled 2020-08-15: qty 2

## 2020-08-15 MED ORDER — DEXAMETHASONE SODIUM PHOSPHATE 10 MG/ML IJ SOLN
INTRAMUSCULAR | Status: DC | PRN
  Administered 2020-08-15: 4 mg via INTRAVENOUS

## 2020-08-15 MED ORDER — SODIUM CHLORIDE 0.9 % IR SOLN
Status: DC | PRN
  Administered 2020-08-15: 3000 mL

## 2020-08-15 MED ORDER — ONDANSETRON HCL 4 MG/2ML IJ SOLN
INTRAMUSCULAR | Status: AC
  Filled 2020-08-15: qty 2

## 2020-08-15 MED ORDER — SODIUM CHLORIDE (PF) 0.9 % IJ SOLN
INTRAMUSCULAR | Status: AC
  Filled 2020-08-15: qty 30

## 2020-08-15 MED ORDER — ACETAMINOPHEN 10 MG/ML IV SOLN: 1000.0000 mg | Freq: Once | INTRAVENOUS | Status: DC | PRN

## 2020-08-15 MED ORDER — ONABOTULINUMTOXINA 100 UNITS IJ SOLR
INTRAMUSCULAR | Status: DC | PRN
  Administered 2020-08-15: 300 [IU] via INTRAMUSCULAR

## 2020-08-15 MED ORDER — FENTANYL CITRATE (PF) 100 MCG/2ML IJ SOLN
INTRAMUSCULAR | Status: AC
  Filled 2020-08-15: qty 2

## 2020-08-15 MED ORDER — ONDANSETRON HCL 4 MG/2ML IJ SOLN
INTRAMUSCULAR | Status: DC | PRN
  Administered 2020-08-15: 4 mg via INTRAVENOUS

## 2020-08-15 MED ORDER — OXYCODONE HCL 5 MG/5ML PO SOLN: 5.0000 mg | Freq: Once | ORAL | Status: DC | PRN

## 2020-08-15 MED ORDER — AMISULPRIDE (ANTIEMETIC) 5 MG/2ML IV SOLN: 10.0000 mg | Freq: Once | INTRAVENOUS | Status: DC | PRN

## 2020-08-15 MED ORDER — MIDAZOLAM HCL 5 MG/5ML IJ SOLN
INTRAMUSCULAR | Status: DC | PRN
  Administered 2020-08-15: 2 mg via INTRAVENOUS

## 2020-08-15 MED ORDER — DEXAMETHASONE SODIUM PHOSPHATE 10 MG/ML IJ SOLN
INTRAMUSCULAR | Status: AC
  Filled 2020-08-15: qty 1

## 2020-08-15 MED ORDER — PROPOFOL 10 MG/ML IV BOLUS
INTRAVENOUS | Status: AC
  Filled 2020-08-15: qty 20

## 2020-08-15 MED ORDER — OXYCODONE HCL 5 MG PO TABS: 5.0000 mg | ORAL_TABLET | Freq: Once | ORAL | Status: DC | PRN

## 2020-08-15 MED ORDER — ORAL CARE MOUTH RINSE: 15.0000 mL | Freq: Once | OROMUCOSAL | Status: AC

## 2020-08-15 MED ORDER — SODIUM CHLORIDE (PF) 0.9 % IJ SOLN
INTRAMUSCULAR | Status: DC | PRN
  Administered 2020-08-15: 30 mL

## 2020-08-15 MED ORDER — 0.9 % SODIUM CHLORIDE (POUR BTL) OPTIME
TOPICAL | Status: DC | PRN
  Administered 2020-08-15: 1000 mL

## 2020-08-15 MED ORDER — HYDROMORPHONE HCL 1 MG/ML IJ SOLN
0.2500 mg | INTRAMUSCULAR | Status: DC | PRN
Start: 2020-08-15 — End: 2020-08-15

## 2020-08-15 MED ORDER — CHLORHEXIDINE GLUCONATE 0.12 % MT SOLN
15.0000 mL | Freq: Once | OROMUCOSAL | Status: AC
  Administered 2020-08-15: 15 mL via OROMUCOSAL

## 2020-08-15 MED ORDER — PHENYLEPHRINE 40 MCG/ML (10ML) SYRINGE FOR IV PUSH (FOR BLOOD PRESSURE SUPPORT)
PREFILLED_SYRINGE | INTRAVENOUS | Status: DC | PRN
  Administered 2020-08-15 (×3): 120 ug via INTRAVENOUS
  Administered 2020-08-15: 40 ug via INTRAVENOUS

## 2020-08-15 MED ORDER — LACTATED RINGERS IV SOLN: INTRAVENOUS | Status: DC

## 2020-08-15 MED ORDER — PROPOFOL 10 MG/ML IV BOLUS
INTRAVENOUS | Status: DC | PRN
  Administered 2020-08-15: 130 mg via INTRAVENOUS

## 2020-08-15 MED ORDER — LIDOCAINE 2% (20 MG/ML) 5 ML SYRINGE
INTRAMUSCULAR | Status: AC
  Filled 2020-08-15: qty 5

## 2020-08-15 MED ORDER — ONABOTULINUMTOXINA 100 UNITS IJ SOLR
INTRAMUSCULAR | Status: AC
  Filled 2020-08-15: qty 200

## 2020-08-15 MED ORDER — ONDANSETRON HCL 4 MG/2ML IJ SOLN: 4.0000 mg | Freq: Once | INTRAMUSCULAR | Status: DC | PRN

## 2020-08-15 MED ORDER — IOHEXOL 300 MG/ML  SOLN
INTRAMUSCULAR | Status: DC | PRN
  Administered 2020-08-15: 10 mL

## 2020-08-15 MED ORDER — SODIUM CHLORIDE 0.9 % IV SOLN
2.0000 g | Freq: Once | INTRAVENOUS | Status: AC
  Administered 2020-08-15: 2 g via INTRAVENOUS
  Filled 2020-08-15: qty 2

## 2020-08-15 MED ORDER — LIDOCAINE 2% (20 MG/ML) 5 ML SYRINGE
INTRAMUSCULAR | Status: DC | PRN
  Administered 2020-08-15: 100 mg via INTRAVENOUS

## 2020-08-15 SURGICAL SUPPLY — 28 items
BAG URO CATCHER STRL LF (MISCELLANEOUS) ×3 IMPLANT
BASKET ZERO TIP NITINOL 2.4FR (BASKET) IMPLANT
CATH INTERMIT  6FR 70CM (CATHETERS) ×3 IMPLANT
CATH URET 5FR 28IN CONE TIP (BALLOONS)
CATH URET 5FR 70CM CONE TIP (BALLOONS) IMPLANT
CLOTH BEACON ORANGE TIMEOUT ST (SAFETY) ×3 IMPLANT
DRSG TEGADERM 2-3/8X2-3/4 SM (GAUZE/BANDAGES/DRESSINGS) ×3 IMPLANT
GLOVE SURG ENC MOIS LTX SZ7.5 (GLOVE) ×3 IMPLANT
GLOVE SURG ENC TEXT LTX SZ7.5 (GLOVE) ×3 IMPLANT
GOWN STRL REUS W/TWL XL LVL3 (GOWN DISPOSABLE) ×3 IMPLANT
GUIDEWIRE STR DUAL SENSOR (WIRE) ×3 IMPLANT
KIT TURNOVER KIT A (KITS) ×3 IMPLANT
LASER FIB FLEXIVA PULSE ID 365 (Laser) IMPLANT
MANIFOLD NEPTUNE II (INSTRUMENTS) ×3 IMPLANT
NDL SAFETY ECLIPSE 18X1.5 (NEEDLE) IMPLANT
NEEDLE ASPIRATION 22 (NEEDLE) ×3 IMPLANT
NEEDLE HYPO 18GX1.5 SHARP (NEEDLE)
PACK CYSTO (CUSTOM PROCEDURE TRAY) ×3 IMPLANT
PENCIL SMOKE EVACUATOR (MISCELLANEOUS) IMPLANT
SHEATH URETERAL 12FRX28CM (UROLOGICAL SUPPLIES) IMPLANT
SHEATH URETERAL 12FRX35CM (MISCELLANEOUS) IMPLANT
SYR CONTROL 10ML LL (SYRINGE) IMPLANT
TRACTIP FLEXIVA PULS ID 200XHI (Laser) IMPLANT
TRACTIP FLEXIVA PULSE ID 200 (Laser)
TRAY FOLEY MTR SLVR 16FR STAT (SET/KITS/TRAYS/PACK) ×3 IMPLANT
TUBING CONNECTING 10 (TUBING) ×3 IMPLANT
TUBING UROLOGY SET (TUBING) ×3 IMPLANT
WATER STERILE IRR 3000ML UROMA (IV SOLUTION) ×3 IMPLANT

## 2020-08-15 NOTE — Anesthesia Procedure Notes (Signed)
Procedure Name: LMA Insertion Performed by: Esme Freund H, CRNA Pre-anesthesia Checklist: Patient identified, Emergency Drugs available, Suction available and Patient being monitored Patient Re-evaluated:Patient Re-evaluated prior to induction Oxygen Delivery Method: Circle System Utilized Preoxygenation: Pre-oxygenation with 100% oxygen Induction Type: IV induction Ventilation: Mask ventilation without difficulty LMA: LMA inserted LMA Size: 5.0 Number of attempts: 1 Airway Equipment and Method: Bite block Placement Confirmation: positive ETCO2 Tube secured with: Tape Dental Injury: Teeth and Oropharynx as per pre-operative assessment        

## 2020-08-15 NOTE — Interval H&P Note (Signed)
History and Physical Interval Note:  08/15/2020 11:03 AM  [name]  has presented today for surgery, with the diagnosis of NEUROGENIC BLADDER.  The various methods of treatment have been discussed with the patient and family. After consideration of risks, benefits and other options for treatment, the patient has consented to  Procedure(s): BOTOX INJECTION WITH CYSTOSCOPY (N/A) URETEROSCOPY/RETROGRAGE AND HOLMIUM LASER/STENT PLACEMENT (N/A) as a surgical intervention.  The patient's history has been reviewed, patient examined, no change in status, stable for surgery.  I have reviewed the patient's chart and labs.  [place] is well. No fever. Disc [place] and KUB findings and he absolutely wants to monitor any stone if it's in the kidney and not causing an issue. We will only approach a stone that could cause issues (renal pelvis or ureter), if there is a stone. He has been on NF. Questions were answered to the patient's satisfaction.  He requests a foley for a few days post-op and needs a foley in a few weeks as all the people that help him with CIC will be away for a day.    [name]

## 2020-08-15 NOTE — Transfer of Care (Signed)
Immediate Anesthesia Transfer of Care Note  Patient: Ryan Watkins  Procedure(s) Performed: BOTOX INJECTION WITH CYSTOSCOPY (N/A ) RETROGRADE (Right )  Patient Location: PACU  Anesthesia Type:General  Level of Consciousness: awake  Airway & Oxygen Therapy: Patient Spontanous Breathing  Post-op Assessment: Report given to RN and Post -op Vital signs reviewed and stable  Post vital signs: Reviewed and stable  Last Vitals:  Vitals Value Taken Time  BP 106/84 08/15/20 1215  Temp 36.4 C 08/15/20 1200  Pulse 70 08/15/20 1216  Resp 13 08/15/20 1216  SpO2 100 % 08/15/20 1216  Vitals shown include unvalidated device data.  Last Pain:  Vitals:   08/15/20 1200  TempSrc:   PainSc: 0-No pain         Complications: No complications documented.

## 2020-08-15 NOTE — Anesthesia Postprocedure Evaluation (Signed)
Anesthesia Post Note  Patient: Ryan Watkins  Procedure(s) Performed: BOTOX INJECTION WITH CYSTOSCOPY (N/A ) RETROGRADE (Right )     Patient location during evaluation: PACU Anesthesia Type: General Level of consciousness: awake and alert Pain management: pain level controlled Vital Signs Assessment: post-procedure vital signs reviewed and stable Respiratory status: spontaneous breathing, nonlabored ventilation, respiratory function stable and patient connected to nasal cannula oxygen Cardiovascular status: blood pressure returned to baseline and stable Postop Assessment: no apparent nausea or vomiting Anesthetic complications: no   No complications documented.  Last Vitals:  Vitals:   08/15/20 1230 08/15/20 1245  BP: 102/75 102/78  Pulse: 67 64  Resp: 15 15  Temp: (!) 36.3 C 36.6 C  SpO2: 98% 99%    Last Pain:  Vitals:   08/15/20 1245  TempSrc:   PainSc: 0-No pain                 Trevor Iha

## 2020-08-15 NOTE — Op Note (Signed)
Preoperative diagnosis: Neurogenic bladder, right renal stone Postoperative diagnosis: Same  Procedure: Cystoscopy with right retrograde pyelogram, Botox injection 300 units  Surgeon: Mena Goes  Anesthesia: General  Indication for procedure: Ryan Watkins is a 35 year old male with a history of paraplegia and neurogenic bladder.  He does CIC.  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  He also has a history of kidney stones and recent KUB revealed a 9 mm possible right lower pole versus renal pelvis or proximal ureteral stone.  The stone was not seen on the ultrasound and no Hydro.  Findings: On cystoscopy the urethra is a bit tight but accommodates to 22 [place] cystoscope.  No foreign body in bladder.  There was about a 4 mm stone in the bladder.  This was drained and discarded.  No other significant bladder findings or mucosal lesions.  Right retrograde pyelogram-this outlined a single ureter single collecting system unit with filling defect in the right lower pole consistent with the stone.  No other filling defects or hydronephrosis.  System drained briskly and no stent or ureteroscopy was attempted at this time. On scout imaging prior to injection an opacity was noted in the region of the right kidney.    Description of procedure: After consent was obtained patient brought to the operating room.  After adequate anesthesia he was placed lithotomy position and prepped and draped in the usual sterile fashion.  A timeout was performed to confirm the patient and procedure.  Cystoscope was passed per urethra and the bladder inspected.  Right ureteral orifice was cannulated with 6 [place] open-ended catheter and right retrograde injection of contrast was performed.  Attention was then turned to the Botox and the scope was removed.  The Botox sheath, handle and needle were then passed per urethra and 300 units of Botox was injected in the usual grid pattern at  10 units/cc with 30 injections.  The bladder was drained and the injection sites were allowed to stop bleeding.  Bladder was refilled and hemostasis was excellent.  Small stone in bladder drained.  Scope was removed and a 16 [place] Foley was placed in left to gravity drainage.  He was awakened taken recovery room in stable condition.  Complications: None  Blood loss: Minimal  Specimens: None  Drains: 16 French Foley catheter  Disposition: Patient stable to PACU

## 2020-08-15 NOTE — Discharge Instructions (Signed)
Botulinum Toxin Bladder Injection  A botulinum toxin bladder injection is a procedure to treat an overactive bladder. During the procedure, a drug called botulinum toxin is injected into the bladder through a long, thin needle. This drug relaxes the bladder muscles and reduces overactivity. You may need this procedure if your medicines are not working or you cannot take them. The procedure may be repeated as needed. The treatment usually lasts for 6 months. Your health care provider will monitor you to see how well you respond. Tell a health care provider about:  Any allergies you have.  All medicines you are taking, including vitamins, herbs, eye drops, creams, and over-the-counter medicines.  Any problems you or family members have had with anesthetic medicines.  Any blood disorders you have.  Any surgeries you have had.  Any medical conditions you have.  Any previous reactions to a botulinum toxin injection.  Any symptoms of urinary tract infection. These include chills, fever, a burning feeling when passing urine, and needing to pass urine often.  Whether you are pregnant or may be pregnant. What are the risks? Generally this is a safe procedure. However, problems may occur, including:  Not being able to pass urine. If this happens, you may need to have your bladder emptied with a thin tube inserted into your urethra (urinary catheter).  Bleeding.  Urinary tract infection.  Allergic reaction to the botulinum toxin.  Pain or burning when passing urine.  Damage to other structures or organs. What happens before the procedure? Staying hydrated Follow instructions from your health care provider about hydration, which may include:  Up to 2 hours before the procedure - you may continue to drink clear liquids, such as water, clear fruit juice, black coffee, and plain tea. Eating and drinking restrictions Follow instructions from your health care provider about eating and  drinking, which may include:  8 hours before the procedure - stop eating heavy meals or foods, such as meat, fried foods, or fatty foods.  6 hours before the procedure - stop eating light meals or foods, such as toast or cereal.  6 hours before the procedure - stop drinking milk or drinks that contain milk.  2 hours before the procedure - stop drinking clear liquids. Medicines Ask your health care provider about:  Changing or stopping your regular medicines. This is especially important if you are taking diabetes medicines or blood thinners.  Taking medicines such as aspirin and ibuprofen. These medicines can thin your blood. Do not take these medicines unless your health care provider tells you to take them.  Taking over-the-counter medicines, vitamins, herbs, and supplements. General instructions  Plan to have someone take you home from the hospital or clinic.  If you will be going home right after the procedure, plan to have someone with you for 24 hours.  Ask your health care provider what steps will be taken to help prevent infection. These may include: ? Removing hair at the procedure site. ? Washing skin with a germ-killing soap. ? Antibiotic medicine. What happens during the procedure?  You will be asked to empty your bladder.  An IV will be inserted into one of your veins.  You will be given one or more of the following: ? A medicine to help you relax (sedative). ? A medicine to numb the area (local anesthetic). ? A medicine to make you fall asleep (general anesthetic).  A long, thin scope called a cystoscope will be passed into your bladder through the part of   the body that carries urine from your bladder (urethra).  The cystoscope will be used to fill your bladder with water.  A long needle will be passed through the cystoscope and into the bladder.  The botulinum toxin will be injected into your bladder. It may be injected into multiple areas of your  bladder.  Your bladder will be emptied, and the cystoscope will be removed. The procedure may vary among health care providers and hospitals.   What can I expect after procedure? After your procedure, it is common to have:  Blood-tinged urine.  Burning or soreness when you pass urine. Follow these instructions at home: Medicines  Take over-the-counter and prescription medicines only as told by your health care provider.  If you were prescribed an antibiotic medicine, take it as told by your health care provider. Do not stop taking the antibiotic even if you start to feel better. General instructions  Do not drive for 24 hours if you were given a sedative during your procedure.  Drink enough fluid to keep your urine pale yellow.  Return to your normal activities as told by your health care provider. Ask your health care provider what activities are safe for you.  Keep all follow-up visits as told by your health care provider. This is important.   Contact a health care provider if you have:  A fever or chills.  Blood-tinged urine for more than one day after your procedure.  Worsening pain or burning when you pass urine.  Pain or burning when passing urine for more than two days after your procedure.  Trouble emptying your bladder. Get help right away if you:  Have bright red blood in your urine.  Are unable to pass urine. Summary  A botulinum toxin bladder injection is a procedure to treat an overactive bladder.  This is generally a very safe procedure. However, problems may occur, including not being able to pass urine, bleeding, infection, pain, and allergic reactions to medicines.  You will be told when to stop eating and drinking, and what medicines to change or stop. Follow instructions carefully.  After the procedure, it is common to have blood in urine and to have soreness or burning when passing urine.  Contact a health care provider if you have a fever, have  blood in urine for more than a few days, or have trouble passing urine. Get help right away if you have bright red blood in the urine, or if you are unable to pass urine. This information is not intended to replace advice given to you by your health care provider. Make sure you discuss any questions you have with your health care provider. Document Revised: 10/31/2017 Document Reviewed: 10/31/2017 Elsevier Patient Education  2021 [employer]  

## 2020-08-16 ENCOUNTER — Encounter (HOSPITAL_COMMUNITY): Payer: Self-pay | Admitting: Urology

## 2021-03-21 ENCOUNTER — Other Ambulatory Visit: Payer: Self-pay | Admitting: Urology

## 2021-04-06 ENCOUNTER — Other Ambulatory Visit (HOSPITAL_COMMUNITY): Payer: Self-pay | Admitting: Urology

## 2021-04-06 ENCOUNTER — Other Ambulatory Visit: Payer: Self-pay | Admitting: Urology

## 2021-04-06 DIAGNOSIS — N200 Calculus of kidney: Secondary | ICD-10-CM

## 2021-04-07 ENCOUNTER — Inpatient Hospital Stay (HOSPITAL_COMMUNITY): Admit: 2021-04-07 | Payer: Medicare Other

## 2021-04-07 ENCOUNTER — Other Ambulatory Visit: Payer: Self-pay

## 2021-04-07 ENCOUNTER — Ambulatory Visit (HOSPITAL_COMMUNITY)
Admission: RE | Admit: 2021-04-07 | Discharge: 2021-04-07 | Disposition: A | Discharge status: Home / Self Care | Payer: Commercial Managed Care - PPO | Source: Ambulatory Visit | Attending: Urology | Admitting: Urology

## 2021-04-07 DIAGNOSIS — N200 Calculus of kidney: Secondary | ICD-10-CM

## 2021-04-24 NOTE — Progress Notes (Signed)
PCP - Dr. Kirtland Bouchard [place] Cardiologist - Joya Gaskins, MD     Chest x-ray - greater than 1 year EKG - 02/02/21 Care everywhere Stress Test - N/A ECHO - greater than 2 years Cardiac Cath - N/A   Sleep Study - N/A CPAP - N/A   Fasting Blood Sugar - N/A Checks Blood Sugar __N/A___ times a day   Blood Thinner Instructions: N/A Aspirin Instructions: N/A Last Dose: N/A  Activity level: Quadriplegic   Anesthesia review:    Patient denies shortness of breath, fever, cough and chest pain at PAT appointment     Patient verbalized understanding of instructions that were given to them at the PAT appointment. Patient was also instructed that they will need to review over the PAT instructions again at home before surgery.

## 2021-04-25 ENCOUNTER — Other Ambulatory Visit: Payer: Self-pay

## 2021-04-25 ENCOUNTER — Encounter (HOSPITAL_COMMUNITY): Payer: Self-pay | Admitting: Urology

## 2021-04-25 NOTE — Progress Notes (Addendum)
COVID swab appointment: N/A  COVID Vaccine Completed: Yes x2 Date COVID Vaccine completed: 12-11-19 01-01-20 Has received booster: COVID vaccine manufacturer: Pfizer      Date of COVID positive in last 90 days:  No  PCP - Dr. Kirtland Bouchard. [place] Cardiologist - Joya Gaskins, MD  (notes [facility])   Chest x-ray - greater than 1 year EKG - 02/02/21 On chart Stress Test - N/A ECHO - greater than 2 years Cardiac Cath - N/A Cardiac monitor - 2022 results [facility]   Sleep Study - N/A CPAP - N/A   Fasting Blood Sugar - N/A Checks Blood Sugar __N/A___ times a day   Blood Thinner Instructions: N/A Aspirin Instructions: N/A Last Dose: N/A   Activity level: Quadriplegic   Anesthesia review: Recent eval by cardiology for palpitations, lightheadedness and chest discomfort.  Patient states that he has not had palpitations or chest discomfort since November.     Currently has a skin ulcer on toe and pressure ulcer heal.  States that the toe has been debrided and improving and the heel ulcer is improving.  Also has an area on his sacrum.  Patient denies shortness of breath, fever, cough and chest pain at PAT appointment (completed over the phone)   Patient verbalized understanding of instructions that were given to them at the PAT appointment. Patient was also instructed that they will need to review over the PAT instructions again at home before surgery.
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
## 2021-05-03 NOTE — Anesthesia Preprocedure Evaluation (Addendum)
Anesthesia Evaluation  Patient identified by MRN, date of birth, ID band Patient awake    Reviewed: Allergy & Precautions, NPO status , Patient's Chart, lab work & pertinent test results  History of Anesthesia Complications Negative for: history of anesthetic complications  Airway Mallampati: I  TM Distance: >3 FB     Dental  (+) Dental Advisory Given   Pulmonary asthma ,    breath sounds clear to auscultation       Cardiovascular negative cardio ROS   Rhythm:Regular Rate:Normal     Neuro/Psych C4 quadriplegia     GI/Hepatic negative GI ROS, Neg liver ROS,   Endo/Other  negative endocrine ROS  Renal/GU Renal InsufficiencyRenal disease Bladder dysfunction (neurogenic bladder)      Musculoskeletal   Abdominal   Peds  Hematology negative hematology ROS (+)   Anesthesia Other Findings   Reproductive/Obstetrics                            Anesthesia Physical Anesthesia Plan  ASA: 3  Anesthesia Plan: General   Post-op Pain Management: Tylenol PO (pre-op) and Minimal or no pain anticipated   Induction: Intravenous  PONV Risk Score and Plan: 2 and Ondansetron and Dexamethasone  Airway Management Planned: LMA  Additional Equipment: None  Intra-op Plan:   Post-operative Plan:   Informed Consent: I have reviewed the patients History and Physical, chart, labs and discussed the procedure including the risks, benefits and alternatives for the proposed anesthesia with the patient or authorized representative who has indicated his/her understanding and acceptance.     Dental advisory given  Plan Discussed with: CRNA and Surgeon  Anesthesia Plan Comments:        Anesthesia Quick Evaluation

## 2021-05-04 ENCOUNTER — Encounter (HOSPITAL_COMMUNITY)
Admission: RE | Disposition: A | Discharge status: Home / Self Care | Payer: Self-pay | Source: Ambulatory Visit | Attending: Urology

## 2021-05-04 ENCOUNTER — Ambulatory Visit (HOSPITAL_COMMUNITY): Payer: Commercial Managed Care - PPO | Admitting: Physician Assistant

## 2021-05-04 ENCOUNTER — Ambulatory Visit (HOSPITAL_COMMUNITY): Payer: Commercial Managed Care - PPO

## 2021-05-04 ENCOUNTER — Ambulatory Visit (HOSPITAL_COMMUNITY)
Admission: RE | Admit: 2021-05-04 | Discharge: 2021-05-04 | Disposition: A | Discharge status: Home / Self Care | Payer: Commercial Managed Care - PPO | Source: Ambulatory Visit | Attending: Urology | Admitting: Urology

## 2021-05-04 ENCOUNTER — Encounter (HOSPITAL_COMMUNITY): Payer: Self-pay | Admitting: Urology

## 2021-05-04 DIAGNOSIS — G904 Autonomic dysreflexia: Secondary | ICD-10-CM | POA: Insufficient documentation

## 2021-05-04 DIAGNOSIS — Z01818 Encounter for other preprocedural examination: Secondary | ICD-10-CM

## 2021-05-04 DIAGNOSIS — N289 Disorder of kidney and ureter, unspecified: Secondary | ICD-10-CM

## 2021-05-04 DIAGNOSIS — N3020 Other chronic cystitis without hematuria: Secondary | ICD-10-CM | POA: Diagnosis not present

## 2021-05-04 DIAGNOSIS — N200 Calculus of kidney: Secondary | ICD-10-CM | POA: Diagnosis not present

## 2021-05-04 DIAGNOSIS — N210 Calculus in bladder: Secondary | ICD-10-CM | POA: Diagnosis not present

## 2021-05-04 DIAGNOSIS — N319 Neuromuscular dysfunction of bladder, unspecified: Secondary | ICD-10-CM | POA: Diagnosis not present

## 2021-05-04 DIAGNOSIS — J45909 Unspecified asthma, uncomplicated: Secondary | ICD-10-CM | POA: Insufficient documentation

## 2021-05-04 DIAGNOSIS — G8250 Quadriplegia, unspecified: Secondary | ICD-10-CM | POA: Insufficient documentation

## 2021-05-04 HISTORY — PX: BOTOX INJECTION: SHX5754

## 2021-05-04 HISTORY — PX: CYSTOSCOPY/URETEROSCOPY/HOLMIUM LASER/STENT PLACEMENT: SHX6546

## 2021-05-04 HISTORY — DX: Chronic kidney disease, unspecified: N18.9

## 2021-05-04 LAB — CBC
HCT: 42.1 % (ref 39.0–52.0)
Hemoglobin: 14.2 g/dL (ref 13.0–17.0)
MCH: 28.9 pg (ref 26.0–34.0)
MCHC: 33.7 g/dL (ref 30.0–36.0)
MCV: 85.7 fL (ref 80.0–100.0)
Platelets: 219 10*3/uL (ref 150–400)
RBC: 4.91 MIL/uL (ref 4.22–5.81)
RDW: 14.5 % (ref 11.5–15.5)
WBC: 8.1 10*3/uL (ref 4.0–10.5)
nRBC: 0 % (ref 0.0–0.2)

## 2021-05-04 LAB — BASIC METABOLIC PANEL
Anion gap: 7 (ref 5–15)
BUN: 17 mg/dL (ref 6–20)
CO2: 24 mmol/L (ref 22–32)
Calcium: 8.7 mg/dL — ABNORMAL LOW (ref 8.9–10.3)
Chloride: 105 mmol/L (ref 98–111)
Creatinine, Ser: 0.74 mg/dL (ref 0.61–1.24)
GFR, Estimated: >60 mL/min (ref >60)
Glucose, Bld: 98 mg/dL (ref 70–99)
Potassium: 3.3 mmol/L — ABNORMAL LOW (ref 3.5–5.1)
Sodium: 136 mmol/L (ref 135–145)

## 2021-05-04 SURGERY — BOTOX INJECTION
Anesthesia: General | Laterality: Right

## 2021-05-04 MED ORDER — MEPERIDINE HCL 50 MG/ML IJ SOLN: 6.2500 mg | INTRAMUSCULAR | Status: DC | PRN

## 2021-05-04 MED ORDER — LIDOCAINE 2% (20 MG/ML) 5 ML SYRINGE
INTRAMUSCULAR | Status: DC | PRN
  Administered 2021-05-04: 40 mg via INTRAVENOUS

## 2021-05-04 MED ORDER — SODIUM CHLORIDE (PF) 0.9 % IJ SOLN
INTRAMUSCULAR | Status: DC | PRN
  Administered 2021-05-04: 30 mL

## 2021-05-04 MED ORDER — ONABOTULINUMTOXINA 100 UNITS IJ SOLR
INTRAMUSCULAR | Status: AC
  Filled 2021-05-04: qty 200

## 2021-05-04 MED ORDER — MIDAZOLAM HCL 5 MG/5ML IJ SOLN
INTRAMUSCULAR | Status: DC | PRN
  Administered 2021-05-04: 2 mg via INTRAVENOUS

## 2021-05-04 MED ORDER — FENTANYL CITRATE (PF) 100 MCG/2ML IJ SOLN
INTRAMUSCULAR | Status: DC | PRN
  Administered 2021-05-04: 50 ug via INTRAVENOUS
  Administered 2021-05-04 (×2): 25 ug via INTRAVENOUS

## 2021-05-04 MED ORDER — ONDANSETRON HCL 4 MG/2ML IJ SOLN
INTRAMUSCULAR | Status: DC | PRN
  Administered 2021-05-04: 4 mg via INTRAVENOUS

## 2021-05-04 MED ORDER — ONDANSETRON HCL 4 MG/2ML IJ SOLN
INTRAMUSCULAR | Status: AC
  Filled 2021-05-04: qty 2

## 2021-05-04 MED ORDER — DEXAMETHASONE SODIUM PHOSPHATE 10 MG/ML IJ SOLN
INTRAMUSCULAR | Status: DC | PRN
Start: 2021-05-04 — End: 2021-05-04
  Administered 2021-05-04: 5 mg via INTRAVENOUS

## 2021-05-04 MED ORDER — PROMETHAZINE HCL 25 MG/ML IJ SOLN: 6.2500 mg | INTRAMUSCULAR | Status: DC | PRN

## 2021-05-04 MED ORDER — CHLORHEXIDINE GLUCONATE 0.12 % MT SOLN: 15.0000 mL | Freq: Once | OROMUCOSAL | Status: AC

## 2021-05-04 MED ORDER — HYDROMORPHONE HCL 1 MG/ML IJ SOLN: 0.2500 mg | INTRAMUSCULAR | Status: DC | PRN

## 2021-05-04 MED ORDER — SODIUM CHLORIDE (PF) 0.9 % IJ SOLN
INTRAMUSCULAR | Status: AC
  Filled 2021-05-04: qty 30

## 2021-05-04 MED ORDER — DEXAMETHASONE SODIUM PHOSPHATE 10 MG/ML IJ SOLN
INTRAMUSCULAR | Status: AC
  Filled 2021-05-04: qty 1

## 2021-05-04 MED ORDER — FENTANYL CITRATE (PF) 100 MCG/2ML IJ SOLN
INTRAMUSCULAR | Status: AC
  Filled 2021-05-04: qty 2

## 2021-05-04 MED ORDER — SODIUM CHLORIDE 0.9 % IR SOLN
Status: DC | PRN
  Administered 2021-05-04: 3000 mL

## 2021-05-04 MED ORDER — OXYCODONE HCL 5 MG PO TABS: 5.0000 mg | ORAL_TABLET | Freq: Once | ORAL | Status: DC | PRN

## 2021-05-04 MED ORDER — PHENYLEPHRINE 40 MCG/ML (10ML) SYRINGE FOR IV PUSH (FOR BLOOD PRESSURE SUPPORT)
PREFILLED_SYRINGE | INTRAVENOUS | Status: DC | PRN
Start: 2021-05-04 — End: 2021-05-04
  Administered 2021-05-04: 80 ug via INTRAVENOUS
  Administered 2021-05-04 (×2): 40 ug via INTRAVENOUS

## 2021-05-04 MED ORDER — LIDOCAINE 2% (20 MG/ML) 5 ML SYRINGE
INTRAMUSCULAR | Status: AC
  Filled 2021-05-04: qty 5

## 2021-05-04 MED ORDER — OXYCODONE HCL 5 MG/5ML PO SOLN: 5.0000 mg | Freq: Once | ORAL | Status: DC | PRN

## 2021-05-04 MED ORDER — MIDAZOLAM HCL 2 MG/2ML IJ SOLN: 0.5000 mg | Freq: Once | INTRAMUSCULAR | Status: DC | PRN

## 2021-05-04 MED ORDER — PROPOFOL 10 MG/ML IV BOLUS
INTRAVENOUS | Status: AC
  Filled 2021-05-04: qty 20

## 2021-05-04 MED ORDER — PROPOFOL 10 MG/ML IV BOLUS
INTRAVENOUS | Status: DC | PRN
  Administered 2021-05-04: 200 mg via INTRAVENOUS

## 2021-05-04 MED ORDER — ORAL CARE MOUTH RINSE
15.0000 mL | Freq: Once | OROMUCOSAL | Status: AC
  Administered 2021-05-04: 06:00:00 15 mL via OROMUCOSAL

## 2021-05-04 MED ORDER — ONABOTULINUMTOXINA 100 UNITS IJ SOLR
INTRAMUSCULAR | Status: DC | PRN
  Administered 2021-05-04: 300 [IU] via INTRAMUSCULAR

## 2021-05-04 MED ORDER — LACTATED RINGERS IV SOLN: INTRAVENOUS | Status: DC

## 2021-05-04 MED ORDER — ACETAMINOPHEN 500 MG PO TABS
1000.0000 mg | ORAL_TABLET | Freq: Once | ORAL | Status: AC
  Administered 2021-05-04: 06:00:00 1000 mg via ORAL
  Filled 2021-05-04: qty 2

## 2021-05-04 MED ORDER — MIDAZOLAM HCL 2 MG/2ML IJ SOLN
INTRAMUSCULAR | Status: AC
  Filled 2021-05-04: qty 2

## 2021-05-04 MED ORDER — CEFAZOLIN SODIUM-DEXTROSE 2-4 GM/100ML-% IV SOLN
2.0000 g | Freq: Once | INTRAVENOUS | Status: AC
  Administered 2021-05-04: 08:00:00 2 g via INTRAVENOUS
  Filled 2021-05-04: qty 100

## 2021-05-04 SURGICAL SUPPLY — 29 items
BAG URINE LEG 500ML (DRAIN) ×1 IMPLANT
BAG URO CATCHER STRL LF (MISCELLANEOUS) ×3 IMPLANT
BASKET ZERO TIP NITINOL 2.4FR (BASKET) IMPLANT
CATH FOLEY 2WAY SLVR  5CC 16FR (CATHETERS) ×3
CATH FOLEY 2WAY SLVR 5CC 16FR (CATHETERS) IMPLANT
CATH URET 5FR 28IN CONE TIP (BALLOONS)
CATH URET 5FR 70CM CONE TIP (BALLOONS) IMPLANT
CATH URETL OPEN END 6FR 70 (CATHETERS) ×3 IMPLANT
CLOTH BEACON ORANGE TIMEOUT ST (SAFETY) ×3 IMPLANT
GLOVE SURG ENC MOIS LTX SZ7.5 (GLOVE) ×3 IMPLANT
GLOVE SURG ENC TEXT LTX SZ7.5 (GLOVE) ×3 IMPLANT
GOWN STRL REUS W/TWL XL LVL3 (GOWN DISPOSABLE) ×4 IMPLANT
GUIDEWIRE STR DUAL SENSOR (WIRE) ×2 IMPLANT
KIT TURNOVER KIT A (KITS) IMPLANT
LASER FIB FLEXIVA PULSE ID 365 (Laser) IMPLANT
MANIFOLD NEPTUNE II (INSTRUMENTS) ×3 IMPLANT
NDL ASPIRATION 22 (NEEDLE) ×2 IMPLANT
NDL SAFETY ECLIPSE 18X1.5 (NEEDLE) IMPLANT
NEEDLE ASPIRATION 22 (NEEDLE) ×3 IMPLANT
NEEDLE HYPO 18GX1.5 SHARP (NEEDLE) ×3
PACK CYSTO (CUSTOM PROCEDURE TRAY) ×3 IMPLANT
SHEATH NAVIGATOR HD 11/13X28 (SHEATH) IMPLANT
SHEATH NAVIGATOR HD 11/13X36 (SHEATH) IMPLANT
SYR CONTROL 10ML LL (SYRINGE) ×1 IMPLANT
TRACTIP FLEXIVA PULS ID 200XHI (Laser) IMPLANT
TRACTIP FLEXIVA PULSE ID 200 (Laser)
TUBING CONNECTING 10 (TUBING) ×3 IMPLANT
TUBING UROLOGY SET (TUBING) ×3 IMPLANT
WATER STERILE IRR 3000ML UROMA (IV SOLUTION) ×2 IMPLANT

## 2021-05-04 NOTE — Interval H&P Note (Signed)
History and Physical Interval Note:  05/04/2021 7:22 AM  Ryan Watkins  has presented today for surgery, with the diagnosis of NEUROGENIC BLADDER, RIGHT KIDNEY STONE.  The various methods of treatment have been discussed with the patient and family. After consideration of risks, benefits and other options for treatment, the patient has consented to  Procedure(s): BOTOX INJECTION WITH CYSTOSCOPY (N/A) CYSTOSCOPY/URETEROSCOPY/HOLMIUM LASER/STENT PLACEMENT (Right) as a surgical intervention.  The patient's history has been reviewed, patient examined, no change in status, stable for surgery.  I have reviewed the patient's chart, imaging and labs. Discussed we DO NOT need to do the URS and stent. Will follow the right stone.   Questions were answered to the patient's and wife's satisfaction.  He is pleased about not URS/stent.    [name]

## 2021-05-04 NOTE — Anesthesia Postprocedure Evaluation (Signed)
Anesthesia Post Note  Patient: [name]  Procedure(s) Performed: BOTOX INJECTION CYSTOSCOPY/LITHOLAPAXY/ FOR A 1CM STONE/RETROGRADE (Right)     Patient location during evaluation: PACU Anesthesia Type: General Level of consciousness: awake and alert, patient cooperative and oriented Pain management: pain level controlled Vital Signs Assessment: post-procedure vital signs reviewed and stable Respiratory status: spontaneous breathing, nonlabored ventilation and respiratory function stable Cardiovascular status: blood pressure returned to baseline and stable Postop Assessment: no apparent nausea or vomiting Anesthetic complications: no   No notable events documented.  Last Vitals:  Vitals:   05/04/21 0845 05/04/21 0900  BP: (!) 119/94 96/64  Pulse: 64 68  Resp: 13 (!) 21  Temp:    SpO2: 100% 100%    Last Pain:  Vitals:   05/04/21 0900  TempSrc:   PainSc: 0-No pain                 Valencia Kassa,E. Afrika Brick

## 2021-05-04 NOTE — OR Nursing (Signed)
Stone taken by Dr. Eskridge ?

## 2021-05-04 NOTE — Transfer of Care (Signed)
Immediate Anesthesia Transfer of Care Note  Patient: Ryan Watkins  Procedure(s) Performed: BOTOX INJECTION CYSTOSCOPY/LITHOLAPAXY/ FOR A 1CM STONE/RETROGRADE (Right)  Patient Location: PACU  Anesthesia Type:General  Level of Consciousness: awake, alert  and oriented  Airway & Oxygen Therapy: Patient Spontanous Breathing and Patient connected to face mask oxygen  Post-op Assessment: Report given to RN and Post -op Vital signs reviewed and stable  Post vital signs: Reviewed and stable  Last Vitals:  Vitals Value Taken Time  BP 139/89 05/04/21 0830  Temp    Pulse 87 05/04/21 0832  Resp 12 05/04/21 0832  SpO2 100 % 05/04/21 0832  Vitals shown include unvalidated device data.  Last Pain:  Vitals:   05/04/21 0553  TempSrc: Oral  PainSc:          Complications: No notable events documented.

## 2021-05-04 NOTE — Anesthesia Procedure Notes (Addendum)
Procedure Name: LMA Insertion Date/Time: 05/04/2021 7:40 AM Performed by: Jlynn Langille, Clinical cytogeneticist D, CRNA Pre-anesthesia Checklist: Patient identified, Emergency Drugs available, Suction available and Patient being monitored Patient Re-evaluated:Patient Re-evaluated prior to induction Oxygen Delivery Method: Circle system utilized Preoxygenation: Pre-oxygenation with 100% oxygen Induction Type: IV induction LMA: LMA inserted LMA Size: 5.0 Number of attempts: 1 Placement Confirmation: positive ETCO2 and breath sounds checked- equal and bilateral Tube secured with: Tape Dental Injury: Teeth and Oropharynx as per pre-operative assessment

## 2021-05-04 NOTE — Op Note (Signed)
Preoperative diagnosis: Neurogenic bladder, right renal stone Postoperative diagnosis: Neurogenic bladder, right renal stone, bladder stone  Procedure: Cystoscopy, right retrograde pyelogram, cystolitholopaxy 1 cm stone, Botox injection 300 units  Surgeon: Mena Goes  Anesthesia: General  Indication for procedure: Azam is a 35 year old male with quadriplegia.  He manages with CIC and has been having autonomic dysreflexia of symptoms and needed above procedures.  We have been monitoring the right renal stone as well.  Patient gets very uncomfortable with a ureteral stent and has a tight ureter, so we only approach ureteroscopy if we have to.  Findings: On cystoscopy the urethra was unremarkable, he has some squamous metaplasia and narrowing proximally but no stricture.  Bladder had a 1 cm stone which was crushed and drained.  Right retrograde pyelogram-on the scout images 7 mm stone was noted in the midpole.  On retrograde again confirm stone in a calyx and not in the pelvis with no filling defect, stricture or dilation of the collecting system renal pelvis or ureter.  Description of procedure: After consent was obtained patient brought to the operating room.  After adequate anesthesia was placed lithotomy position and prepped and draped in usual sterile fashion.  Timeout was performed to confirm the patient and procedure.  Cystoscope passed per urethra and the bladder inspected.  A bladder stone was noted.  The right ureteral orifice cannulated with a 6 [place] open-ended catheter and retrograde injection of contrast was performed.  Rigid graspers were passed and the stone was fragmented and drained from the bladder.  The scope was removed and the injection scope and sheath passed.  Botox 300 units was injected at 10 units/mL, 1 mL injections x 30 in the usual grid fashion.  Typical oozing from some of the injection sites quickly settled down and stopped.  The scope was backed out and a 16 [place] Foley  passed in left to gravity drainage.  Urine was clear.  He was awakened taken the cover room in stable condition.  Complications: None  Blood loss: Minimal  Specimens: Stone fragment office lab  Drains: 16 French Foley  Disposition: Patient stable to PACU-discussed with Verlon Au the procedure, postop care and follow-up.

## 2021-05-05 ENCOUNTER — Encounter (HOSPITAL_COMMUNITY): Payer: Self-pay | Admitting: Urology

## 2021-10-04 ENCOUNTER — Other Ambulatory Visit: Payer: Self-pay | Admitting: Urology

## 2021-10-18 NOTE — Patient Instructions (Addendum)
DUE TO COVID-19 ONLY TWO VISITORS  (aged 35 and older)  ARE ALLOWED TO COME WITH YOU AND STAY IN THE WAITING ROOM ONLY DURING PRE OP AND PROCEDURE.   **NO VISITORS ARE ALLOWED IN THE SHORT STAY AREA OR RECOVERY ROOM!!**    Your procedure is scheduled on: 10/23/21   Report to [facility] Main Entrance    Report to admitting at 7:00 AM   Call this number if you have problems the morning of surgery [phone]   Do not eat food :After Midnight.   After Midnight you may have the following liquids until 6:15 AM DAY OF SURGERY  Water Black Coffee (sugar ok, NO MILK/CREAM OR CREAMERS)  Tea (sugar ok, NO MILK/CREAM OR CREAMERS) regular and decaf                             Plain Jell-O (NO RED)                                           Fruit ices (not with fruit pulp, NO RED)                                     Popsicles (NO RED)                                                                  Juice: apple, WHITE grape, WHITE cranberry Sports drinks like Gatorade (NO RED) Clear broth(vegetable,chicken,beef)  FOLLOW BOWEL PREP AND ANY ADDITIONAL PRE OP INSTRUCTIONS YOU RECEIVED FROM YOUR SURGEON'S OFFICE!!!     Oral Hygiene is also important to reduce your risk of infection.                                    Remember - BRUSH YOUR TEETH THE MORNING OF SURGERY WITH YOUR REGULAR TOOTHPASTE   Take these medicines the morning of surgery with A SIP OF WATER: Flonase                              You may not have any metal on your body including jewelry, and body piercing             Do not wear lotions, powders, cologne, or deodorant               Men may shave face and neck.   Do not bring valuables to the hospital. [facility] IS NOT             RESPONSIBLE   FOR VALUABLES.  DO NOT BRING YOUR HOME MEDICATIONS TO THE HOSPITAL. PHARMACY WILL DISPENSE MEDICATIONS LISTED ON YOUR MEDICATION LIST TO YOU DURING YOUR ADMISSION IN THE HOSPITAL!    Patients discharged on the day of  surgery will not be allowed to drive home.  Someone NEEDS to stay with you for the first 24 hours after anesthesia.  Please read over the following fact sheets you were given: IF YOU HAVE QUESTIONS ABOUT YOUR PRE-OP INSTRUCTIONS PLEASE CALL [phone]- [facility] - Preparing for Surgery Before surgery, you can play an important role.  Because skin is not sterile, your skin needs to be as free of germs as possible.  You can reduce the number of germs on your skin by washing with CHG (chlorahexidine gluconate) soap before surgery.  CHG is an antiseptic cleaner which kills germs and bonds with the skin to continue killing germs even after washing. Please DO NOT use if you have an allergy to CHG or antibacterial soaps.  If your skin becomes reddened/irritated stop using the CHG and inform your nurse when you arrive at Short Stay. Do not shave (including legs and underarms) for at least 48 hours prior to the first CHG shower.  You may shave your face/neck.  Please follow these instructions carefully:  1.  Shower with CHG Soap the night before surgery and the  morning of surgery.  2.  If you choose to wash your hair, wash your hair first as usual with your normal  shampoo.  3.  After you shampoo, rinse your hair and body thoroughly to remove the shampoo.                             4.  Use CHG as you would any other liquid soap.  You can apply chg directly to the skin and wash.  Gently with a scrungie or clean washcloth.  5.  Apply the CHG Soap to your body ONLY FROM THE NECK DOWN.   Do   not use on face/ open                           Wound or open sores. Avoid contact with eyes, ears mouth and   genitals (private parts).                       Wash face,  Genitals (private parts) with your normal soap.             6.  Wash thoroughly, paying special attention to the area where your    surgery  will be performed.  7.  Thoroughly rinse your body with warm water from the neck  down.  8.  DO NOT shower/wash with your normal soap after using and rinsing off the CHG Soap.                9.  Pat yourself dry with a clean towel.            10.  Wear clean pajamas.            11.  Place clean sheets on your bed the night of your first shower and do not  sleep with pets. Day of Surgery : Do not apply any lotions/deodorants the morning of surgery.  Please wear clean clothes to the hospital/surgery center.  FAILURE TO FOLLOW THESE INSTRUCTIONS MAY RESULT IN THE CANCELLATION OF YOUR SURGERY  PATIENT SIGNATURE_________________________________  NURSE SIGNATURE__________________________________  ________________________________________________________________________

## 2021-10-18 NOTE — Progress Notes (Addendum)
Interview completed over the phone. Went over instructions verbally and emailed to patient's preferred e-mail address. Answered all questions. Patient  will call back with any questions.  COVID Vaccine Completed: yes x2  Date of COVID positive in last 90 days: no  PCP - Kip Corrington, MD Cardiologist - no  Chest x-ray - n/a EKG - 02/02/21 req [facility] Stress Test - n/a ECHO - 10/05/21 [facility] Cardiac Cath - n/a Pacemaker/ICD device last checked: n/a Spinal Cord Stimulator: n/a  Bowel Prep - no  Sleep Study - n/a CPAP -   Fasting Blood Sugar - n/a Checks Blood Sugar _____ times a day  Blood Thinner Instructions: n/a Aspirin Instructions: Last Dose:  Activity level: Patient is quadriplegic. Gets around in electric wheelchair.     Anesthesia review: palpitations, small wound back of thigh right small amount of clear drainage per pt report  Patient denies shortness of breath, fever, cough and chest pain at PAT appointment  Patient verbalized understanding of instructions that were given to them at the PAT appointment. Patient was also instructed that they will need to review over the PAT instructions again at home before surgery.

## 2021-10-19 ENCOUNTER — Encounter (HOSPITAL_COMMUNITY)
Admission: RE | Admit: 2021-10-19 | Discharge: 2021-10-19 | Disposition: A | Discharge status: Home / Self Care | Payer: Medicare Other | Source: Ambulatory Visit | Attending: Urology | Admitting: Urology

## 2021-10-19 ENCOUNTER — Encounter (HOSPITAL_COMMUNITY): Payer: Self-pay

## 2021-10-19 VITALS — Ht 72.0 in | Wt 160.0 lb

## 2021-10-19 DIAGNOSIS — Z01818 Encounter for other preprocedural examination: Secondary | ICD-10-CM

## 2021-10-19 HISTORY — DX: Cardiac murmur, unspecified: R01.1
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
## 2021-10-22 NOTE — Anesthesia Preprocedure Evaluation (Signed)
Anesthesia Evaluation  Patient identified by MRN, date of birth, ID band Patient awake    Reviewed: Allergy & Precautions, NPO status , Patient's Chart, lab work & pertinent test results  Airway Mallampati: II  TM Distance: >3 FB Neck ROM: Full    Dental no notable dental hx. (+) Dental Advisory Given, Teeth Intact   Pulmonary asthma ,    Pulmonary exam normal breath sounds clear to auscultation       Cardiovascular Normal cardiovascular exam Rhythm:Regular Rate:Normal     Neuro/Psych C4 Quadraplegic  Neuromuscular disease    GI/Hepatic   Endo/Other    Renal/GU Renal disease     Musculoskeletal   Abdominal   Peds  Hematology   Anesthesia Other Findings ALL: MSO4  Reproductive/Obstetrics                            Anesthesia Physical Anesthesia Plan  ASA: 2  Anesthesia Plan: General   Post-op Pain Management:    Induction: Intravenous  PONV Risk Score and Plan: 3 and Treatment may vary due to age or medical condition, Midazolam and Ondansetron  Airway Management Planned: LMA  Additional Equipment: None  Intra-op Plan:   Post-operative Plan:   Informed Consent: I have reviewed the patients History and Physical, chart, labs and discussed the procedure including the risks, benefits and alternatives for the proposed anesthesia with the patient or authorized representative who has indicated his/her understanding and acceptance.     Dental advisory given  Plan Discussed with:   Anesthesia Plan Comments:        Anesthesia Quick Evaluation

## 2021-10-23 ENCOUNTER — Ambulatory Visit (HOSPITAL_COMMUNITY): Payer: BC Managed Care – PPO

## 2021-10-23 ENCOUNTER — Ambulatory Visit (HOSPITAL_COMMUNITY): Payer: BC Managed Care – PPO | Admitting: Anesthesiology

## 2021-10-23 ENCOUNTER — Ambulatory Visit (HOSPITAL_COMMUNITY): Payer: BC Managed Care – PPO | Admitting: Physician Assistant

## 2021-10-23 ENCOUNTER — Encounter (HOSPITAL_COMMUNITY)
Admission: RE | Disposition: A | Discharge status: Home / Self Care | Payer: Self-pay | Source: Home / Self Care | Attending: Urology

## 2021-10-23 ENCOUNTER — Ambulatory Visit (HOSPITAL_COMMUNITY)
Admission: RE | Admit: 2021-10-23 | Discharge: 2021-10-23 | Disposition: A | Discharge status: Home / Self Care | Payer: BC Managed Care – PPO | Attending: Urology | Admitting: Urology

## 2021-10-23 ENCOUNTER — Encounter (HOSPITAL_COMMUNITY): Payer: Self-pay | Admitting: Urology

## 2021-10-23 DIAGNOSIS — G8250 Quadriplegia, unspecified: Secondary | ICD-10-CM | POA: Insufficient documentation

## 2021-10-23 DIAGNOSIS — K317 Polyp of stomach and duodenum: Secondary | ICD-10-CM | POA: Diagnosis not present

## 2021-10-23 DIAGNOSIS — I8510 Secondary esophageal varices without bleeding: Secondary | ICD-10-CM | POA: Diagnosis not present

## 2021-10-23 DIAGNOSIS — K766 Portal hypertension: Secondary | ICD-10-CM | POA: Diagnosis not present

## 2021-10-23 DIAGNOSIS — N200 Calculus of kidney: Secondary | ICD-10-CM | POA: Insufficient documentation

## 2021-10-23 DIAGNOSIS — N3281 Overactive bladder: Secondary | ICD-10-CM | POA: Diagnosis not present

## 2021-10-23 DIAGNOSIS — X58XXXS Exposure to other specified factors, sequela: Secondary | ICD-10-CM | POA: Insufficient documentation

## 2021-10-23 DIAGNOSIS — S14104S Unspecified injury at C4 level of cervical spinal cord, sequela: Secondary | ICD-10-CM | POA: Insufficient documentation

## 2021-10-23 DIAGNOSIS — Z87440 Personal history of urinary (tract) infections: Secondary | ICD-10-CM | POA: Diagnosis not present

## 2021-10-23 DIAGNOSIS — Z01818 Encounter for other preprocedural examination: Secondary | ICD-10-CM

## 2021-10-23 DIAGNOSIS — N319 Neuromuscular dysfunction of bladder, unspecified: Secondary | ICD-10-CM | POA: Diagnosis present

## 2021-10-23 DIAGNOSIS — K3189 Other diseases of stomach and duodenum: Secondary | ICD-10-CM | POA: Diagnosis not present

## 2021-10-23 HISTORY — PX: BOTOX INJECTION: SHX5754

## 2021-10-23 SURGERY — BOTOX INJECTION
Anesthesia: General | Site: Renal

## 2021-10-23 MED ORDER — ONABOTULINUMTOXINA 100 UNITS IJ SOLR
INTRAMUSCULAR | Status: AC
  Filled 2021-10-23: qty 100

## 2021-10-23 MED ORDER — CEFAZOLIN SODIUM-DEXTROSE 2-4 GM/100ML-% IV SOLN
2.0000 g | Freq: Once | INTRAVENOUS | Status: AC
  Administered 2021-10-23 (×2): 2 g via INTRAVENOUS
  Filled 2021-10-23: qty 100

## 2021-10-23 MED ORDER — LACTATED RINGERS IV SOLN: INTRAVENOUS | Status: DC

## 2021-10-23 MED ORDER — GENTAMICIN SULFATE 40 MG/ML IJ SOLN
5.0000 mg/kg | INTRAMUSCULAR | Status: DC
  Administered 2021-10-23: 360 mg via INTRAVENOUS
  Filled 2021-10-23: qty 9

## 2021-10-23 MED ORDER — LIDOCAINE HCL URETHRAL/MUCOSAL 2 % EX GEL
CUTANEOUS | Status: AC
  Filled 2021-10-23: qty 30

## 2021-10-23 MED ORDER — DEXAMETHASONE SODIUM PHOSPHATE 10 MG/ML IJ SOLN
INTRAMUSCULAR | Status: DC | PRN
  Administered 2021-10-23: 10 mg via INTRAVENOUS

## 2021-10-23 MED ORDER — OXYCODONE HCL 5 MG/5ML PO SOLN: 5.0000 mg | Freq: Once | ORAL | Status: DC | PRN

## 2021-10-23 MED ORDER — SODIUM CHLORIDE (PF) 0.9 % IJ SOLN
INTRAMUSCULAR | Status: AC
  Filled 2021-10-23: qty 10

## 2021-10-23 MED ORDER — DEXTROSE 5 % IV SOLN
400.0000 mg | INTRAVENOUS | Status: DC
  Filled 2021-10-23 (×2): qty 10

## 2021-10-23 MED ORDER — IOHEXOL 300 MG/ML  SOLN
INTRAMUSCULAR | Status: DC | PRN
  Administered 2021-10-23: 6 mL

## 2021-10-23 MED ORDER — ONABOTULINUMTOXINA 100 UNITS IJ SOLR
INTRAMUSCULAR | Status: DC | PRN
  Administered 2021-10-23: 300 [IU] via INTRAMUSCULAR

## 2021-10-23 MED ORDER — FENTANYL CITRATE (PF) 100 MCG/2ML IJ SOLN
INTRAMUSCULAR | Status: AC
  Filled 2021-10-23: qty 2

## 2021-10-23 MED ORDER — PROPOFOL 10 MG/ML IV BOLUS
INTRAVENOUS | Status: DC | PRN
  Administered 2021-10-23: 130 mg via INTRAVENOUS

## 2021-10-23 MED ORDER — CHLORHEXIDINE GLUCONATE 0.12 % MT SOLN: 15.0000 mL | Freq: Once | OROMUCOSAL | Status: DC

## 2021-10-23 MED ORDER — OXYCODONE HCL 5 MG PO TABS: 5.0000 mg | ORAL_TABLET | Freq: Once | ORAL | Status: DC | PRN

## 2021-10-23 MED ORDER — LIDOCAINE HCL URETHRAL/MUCOSAL 2 % EX GEL
CUTANEOUS | Status: AC
  Filled 2021-10-23: qty 5

## 2021-10-23 MED ORDER — ACETAMINOPHEN 10 MG/ML IV SOLN: 1000.0000 mg | Freq: Once | INTRAVENOUS | Status: DC | PRN

## 2021-10-23 MED ORDER — SODIUM CHLORIDE 0.9 % IR SOLN
Status: DC | PRN
  Administered 2021-10-23: 3000 mL

## 2021-10-23 MED ORDER — MIDAZOLAM HCL 2 MG/2ML IJ SOLN
INTRAMUSCULAR | Status: AC
Start: 2021-10-23 — End: ?
  Filled 2021-10-23: qty 2

## 2021-10-23 MED ORDER — DEXMEDETOMIDINE (PRECEDEX) IN NS 20 MCG/5ML (4 MCG/ML) IV SYRINGE
PREFILLED_SYRINGE | INTRAVENOUS | Status: AC
  Filled 2021-10-23: qty 5

## 2021-10-23 MED ORDER — DEXMEDETOMIDINE (PRECEDEX) IN NS 20 MCG/5ML (4 MCG/ML) IV SYRINGE
PREFILLED_SYRINGE | INTRAVENOUS | Status: DC | PRN
  Administered 2021-10-23: 8 ug via INTRAVENOUS

## 2021-10-23 MED ORDER — LIDOCAINE HCL (PF) 2 % IJ SOLN
INTRAMUSCULAR | Status: AC
  Filled 2021-10-23: qty 5

## 2021-10-23 MED ORDER — MIDAZOLAM HCL 5 MG/5ML IJ SOLN
INTRAMUSCULAR | Status: DC | PRN
  Administered 2021-10-23: 2 mg via INTRAVENOUS

## 2021-10-23 MED ORDER — FENTANYL CITRATE (PF) 100 MCG/2ML IJ SOLN
INTRAMUSCULAR | Status: DC | PRN
  Administered 2021-10-23: 50 ug via INTRAVENOUS

## 2021-10-23 MED ORDER — PHENYLEPHRINE 80 MCG/ML (10ML) SYRINGE FOR IV PUSH (FOR BLOOD PRESSURE SUPPORT)
PREFILLED_SYRINGE | INTRAVENOUS | Status: AC
  Filled 2021-10-23: qty 10

## 2021-10-23 MED ORDER — SODIUM CHLORIDE (PF) 0.9 % IJ SOLN
INTRAMUSCULAR | Status: DC | PRN
  Administered 2021-10-23: 10 mL

## 2021-10-23 MED ORDER — PROPOFOL 10 MG/ML IV BOLUS
INTRAVENOUS | Status: AC
  Filled 2021-10-23: qty 20

## 2021-10-23 MED ORDER — HYDROMORPHONE HCL 1 MG/ML IJ SOLN: 0.2500 mg | INTRAMUSCULAR | Status: DC | PRN

## 2021-10-23 MED ORDER — ORAL CARE MOUTH RINSE: 15.0000 mL | Freq: Once | OROMUCOSAL | Status: DC

## 2021-10-23 MED ORDER — ONABOTULINUMTOXINA 100 UNITS IJ SOLR
INTRAMUSCULAR | Status: AC
Start: 2021-10-23 — End: ?
  Filled 2021-10-23: qty 100

## 2021-10-23 MED ORDER — DEXAMETHASONE SODIUM PHOSPHATE 10 MG/ML IJ SOLN
INTRAMUSCULAR | Status: AC
  Filled 2021-10-23: qty 1

## 2021-10-23 MED ORDER — SODIUM CHLORIDE (PF) 0.9 % IJ SOLN
INTRAMUSCULAR | Status: AC
  Filled 2021-10-23: qty 100

## 2021-10-23 MED ORDER — ONDANSETRON HCL 4 MG/2ML IJ SOLN: 4.0000 mg | Freq: Once | INTRAMUSCULAR | Status: DC | PRN

## 2021-10-23 MED ORDER — ONDANSETRON HCL 4 MG/2ML IJ SOLN
INTRAMUSCULAR | Status: AC
  Filled 2021-10-23: qty 2

## 2021-10-23 MED ORDER — ONDANSETRON HCL 4 MG/2ML IJ SOLN
INTRAMUSCULAR | Status: DC | PRN
  Administered 2021-10-23: 4 mg via INTRAVENOUS

## 2021-10-23 MED ORDER — LIDOCAINE 2% (20 MG/ML) 5 ML SYRINGE
INTRAMUSCULAR | Status: DC | PRN
  Administered 2021-10-23: 100 mg via INTRAVENOUS

## 2021-10-23 MED ORDER — PHENYLEPHRINE 80 MCG/ML (10ML) SYRINGE FOR IV PUSH (FOR BLOOD PRESSURE SUPPORT)
PREFILLED_SYRINGE | INTRAVENOUS | Status: DC | PRN
  Administered 2021-10-23 (×2): 160 ug via INTRAVENOUS
  Administered 2021-10-23: 80 ug via INTRAVENOUS
  Administered 2021-10-23: 160 ug via INTRAVENOUS

## 2021-10-23 MED ORDER — LIDOCAINE HCL URETHRAL/MUCOSAL 2 % EX GEL
CUTANEOUS | Status: DC | PRN
  Administered 2021-10-23: 1

## 2021-10-23 MED ORDER — AMISULPRIDE (ANTIEMETIC) 5 MG/2ML IV SOLN: 10.0000 mg | Freq: Once | INTRAVENOUS | Status: DC | PRN

## 2021-10-23 SURGICAL SUPPLY — 23 items
BAG URINE DRAIN 2000ML AR STRL (UROLOGICAL SUPPLIES) ×1 IMPLANT
BAG URO CATCHER STRL LF (MISCELLANEOUS) ×2 IMPLANT
CATH FOLEY 2WAY SLVR  5CC 16FR (CATHETERS) ×2
CATH FOLEY 2WAY SLVR 5CC 16FR (CATHETERS) IMPLANT
CLOTH BEACON ORANGE TIMEOUT ST (SAFETY) ×1 IMPLANT
DRSG MEPILEX BORDER 4X8 (GAUZE/BANDAGES/DRESSINGS) ×1 IMPLANT
GLOVE SURG LX 7.5 STRW (GLOVE) ×1
GLOVE SURG LX STRL 7.5 STRW (GLOVE) ×1 IMPLANT
GOWN STRL REUS W/ TWL XL LVL3 (GOWN DISPOSABLE) ×1 IMPLANT
GOWN STRL REUS W/TWL XL LVL3 (GOWN DISPOSABLE) ×2
GUIDEWIRE STR DUAL SENSOR (WIRE) ×1 IMPLANT
KIT TURNOVER KIT A (KITS) ×1 IMPLANT
MANIFOLD NEPTUNE II (INSTRUMENTS) ×2 IMPLANT
NDL ASPIRATION 22 (NEEDLE) ×1 IMPLANT
NDL SAFETY ECLIPSE 18X1.5 (NEEDLE) IMPLANT
NEEDLE ASPIRATION 22 (NEEDLE) ×2 IMPLANT
NEEDLE HYPO 18GX1.5 SHARP (NEEDLE)
PACK CYSTO (CUSTOM PROCEDURE TRAY) ×2 IMPLANT
PENCIL SMOKE EVACUATOR (MISCELLANEOUS) IMPLANT
STENT URET 6FRX26 CONTOUR (STENTS) ×1 IMPLANT
SYR CONTROL 10ML LL (SYRINGE) IMPLANT
TUBING CONNECTING 10 (TUBING) ×1 IMPLANT
WATER STERILE IRR 3000ML UROMA (IV SOLUTION) ×2 IMPLANT

## 2021-10-23 NOTE — Anesthesia Postprocedure Evaluation (Signed)
Anesthesia Post Note  Patient: Ryan Watkins  Procedure(s) Performed: BOTOX INJECTION WITH CYSTOSCOPY, RIGHT RETROGRADE PYELOGRAM, RIGHT URETAL STENT PLACEMENT (Renal)     Patient location during evaluation: PACU Anesthesia Type: General Level of consciousness: awake and alert Pain management: pain level controlled Vital Signs Assessment: post-procedure vital signs reviewed and stable Respiratory status: spontaneous breathing, nonlabored ventilation, respiratory function stable and patient connected to nasal cannula oxygen Cardiovascular status: blood pressure returned to baseline and stable Postop Assessment: no apparent nausea or vomiting Anesthetic complications: no   No notable events documented.  Last Vitals:  Vitals:   10/23/21 1100 10/23/21 1109  BP: 108/69 105/75  Pulse: 70 66  Resp: 18 10  Temp: (!) 36.4 C   SpO2: 96% 100%    Last Pain:  Vitals:   10/23/21 1100  TempSrc:   PainSc: 0-No pain                 Trevor Iha

## 2021-10-23 NOTE — Discharge Instructions (Addendum)
Dr Estil Daft office will call with next procedure for right ureteroscopy, laser of the kidney stones

## 2021-10-23 NOTE — Transfer of Care (Signed)
Immediate Anesthesia Transfer of Care Note  Patient: Ryan Watkins  Procedure(s) Performed: BOTOX INJECTION WITH CYSTOSCOPY, RIGHT RETROGRADE PYELOGRAM, RIGHT URETAL STENT PLACEMENT (Renal)  Patient Location: PACU  Anesthesia Type:General  Level of Consciousness: awake, alert  and oriented  Airway & Oxygen Therapy: Patient Spontanous Breathing and Patient connected to face mask oxygen  Post-op Assessment: Report given to RN and Post -op Vital signs reviewed and stable  Post vital signs: Reviewed and stable  Last Vitals:  Vitals Value Taken Time  BP 131/91 10/23/21 1027  Temp    Pulse 85 10/23/21 1027  Resp 13 10/23/21 1027  SpO2 100 % 10/23/21 1027  Vitals shown include unvalidated device data.  Last Pain:  Vitals:   10/23/21 0743  TempSrc: Oral  PainSc: 0-No pain         Complications: No notable events documented.

## 2021-10-23 NOTE — Interval H&P Note (Signed)
History and Physical Interval Note:  10/23/2021 9:23 AM  Ryan Watkins  has presented today for surgery, with the diagnosis of NEUROGENIC BLADDER.  The various methods of treatment have been discussed with the patient and family. After consideration of risks, benefits and other options for treatment, the patient has consented to  Procedure(s): BOTOX INJECTION WITH CYSTOSCOPY AND RETROGRADE (N/A) as a surgical intervention.  The patient's history has been reviewed, patient examined, no change in status, stable for surgery.  I have reviewed the patient's chart and labs.  Questions were answered to the patient's satisfaction.  He is well. Urine clear. On abx. No fever. We discussed adding right RGP and possible right ureteral stent for surveillance of the right renal stone and he elects to proceed. We discussed stone management depending on size (<10 surv, >10 URS, and > 20 PCNL) in general terms. They elect to proceed with botox, right RGP and possible right ureteral stent if we need to plan a staged right URS. He typically has a very tight ureter and needs a staged procedure. I also added gentamicin to his pre-op abx with ancef.    [name]

## 2021-10-23 NOTE — Op Note (Signed)
Preoperative diagnosis: Right renal stone, neurogenic bladder Postoperative diagnosis: Same  Procedure: Cystoscopy right retrograde pyelogram, right ureteral stent placement, Botox injection 300 units  Surgeon: Mena Goes  Anesthesia: General  Indication for procedure: Woodson is a 36 year old male with a history of neurogenic bladder.  Also a right renal stone.  Findings: On exam penis appeared normal with no lesion, circumcised.  Foreskin normal.  Scrotum appeared normal.  On cystoscopy the urethra and the bladder were unremarkable.  Some small stone debris in the bladder but nothing significant.  Right retrograde pyelogram-this outlined a single ureter single collecting system unit without filling defect, stricture or dilation.  Ureter had typical narrowness for him in the distal ureter and up at the proximal ureter and UPJ.  There was no hydronephrosis.  There were 2 filling defects 1 in the renal pelvis somewhat in the lower pole consistent with the stones.  The stones had been visible on the scout image.  I estimated the stone in the renal pelvis to be about a 15 mm and about a centimeter in the lower pole.  Therefore a stent was placed as we discussed for staged procedure.  Description of procedure: After consent was obtained patient brought to the operating room.  After adequate anesthesia he was placed lithotomy position and prepped and draped in the usual sterile fashion.  Timeout was performed to confirm the patient and procedure.  Cystoscope was passed per urethra and the bladder carefully inspected.  The right ureteral orifice was cannulated with an open-ended catheter and retrograde injection of contrast was performed.  Given the stones in the kidney a sensor wire was advanced in the upper calyx and a 626 stent passed.  Good coil was seen in the upper calyx and a good coil in the bladder.  The bladder was irrigated again several times and the scope backed out.  The injection scope was  then passed and 300 units of Botox was injected at 10 units/mL x 30 injections in the usual grid fashion.  Hemostasis was excellent at low pressure.  Scope was backed out and a 16 [place] Foley catheter was placed.  He was awakened taken recovery room in stable condition.  Complications: None  Blood loss: Minimal  Specimens: None  Drains: 6 x 26 cm right ureteral stent 16 [place] Foley catheter  Disposition: Patient stable to PACU

## 2021-10-23 NOTE — Anesthesia Procedure Notes (Signed)
Procedure Name: LMA Insertion Date/Time: 10/23/2021 9:36 AM  Performed by: Florene Route, CRNAPatient Re-evaluated:Patient Re-evaluated prior to induction Oxygen Delivery Method: Circle system utilized Preoxygenation: Pre-oxygenation with 100% oxygen LMA: LMA with gastric port inserted LMA Size: 4.0 Number of attempts: 1 Placement Confirmation: positive ETCO2 and breath sounds checked- equal and bilateral Tube secured with: Tape Dental Injury: Teeth and Oropharynx as per pre-operative assessment

## 2021-10-24 ENCOUNTER — Encounter (HOSPITAL_COMMUNITY): Payer: Self-pay | Admitting: Urology

## 2021-10-30 ENCOUNTER — Other Ambulatory Visit: Payer: Self-pay | Admitting: Urology

## 2021-11-07 NOTE — Progress Notes (Addendum)
For Short Stay: COVID SWAB appointment date: N/A Date of COVID positive in last 90 days: N/A  Bowel Prep reminder: N/A   For Anesthesia: PCP - Kip A Corrington, MD Cardiologist - N/A  Chest x-ray - Greater than 1 year in epic EKG - N/A Stress Test - N/A ECHO - 10/05/21 in epic Cardiac Cath - N/A Pacemaker/ICD device last checked:N/A Pacemaker orders received:N/A Device Rep notified:N/A  Spinal Cord Stimulator: N/A  Sleep Study - N/A CPAP - N/A  Fasting Blood Sugar - N/A Checks Blood Sugar ___N/A__ times a day Date and result of last Hgb A1c-N/A  Blood Thinner Instructions:N/A  Aspirin Instructions: N/A Last Dose: N/A  Activity level: patient quadriplegic     Anesthesia review: N/A  Patient denies shortness of breath, fever, cough and chest pain at PAT appointment   Patient verbalized understanding of instructions that were given to them at the PAT appointment. Patient was also instructed that they will need to review over the PAT instructions again at home before surgery.

## 2021-11-13 ENCOUNTER — Other Ambulatory Visit: Payer: Self-pay

## 2021-11-13 ENCOUNTER — Encounter (HOSPITAL_COMMUNITY): Payer: Self-pay | Admitting: Urology
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
## 2021-11-16 ENCOUNTER — Ambulatory Visit (HOSPITAL_COMMUNITY): Payer: BC Managed Care – PPO | Admitting: Anesthesiology

## 2021-11-16 ENCOUNTER — Encounter (HOSPITAL_COMMUNITY): Payer: Self-pay | Admitting: Urology

## 2021-11-16 ENCOUNTER — Ambulatory Visit (HOSPITAL_COMMUNITY): Payer: BC Managed Care – PPO

## 2021-11-16 ENCOUNTER — Ambulatory Visit (HOSPITAL_COMMUNITY)
Admission: RE | Admit: 2021-11-16 | Discharge: 2021-11-16 | Disposition: A | Discharge status: Home / Self Care | Payer: BC Managed Care – PPO | Source: Ambulatory Visit | Attending: Urology | Admitting: Urology

## 2021-11-16 ENCOUNTER — Encounter (HOSPITAL_COMMUNITY)
Admission: RE | Disposition: A | Discharge status: Home / Self Care | Payer: Self-pay | Source: Ambulatory Visit | Attending: Urology

## 2021-11-16 DIAGNOSIS — G988 Other disorders of nervous system: Secondary | ICD-10-CM

## 2021-11-16 DIAGNOSIS — N200 Calculus of kidney: Secondary | ICD-10-CM | POA: Diagnosis present

## 2021-11-16 DIAGNOSIS — N319 Neuromuscular dysfunction of bladder, unspecified: Secondary | ICD-10-CM | POA: Diagnosis not present

## 2021-11-16 DIAGNOSIS — S14104S Unspecified injury at C4 level of cervical spinal cord, sequela: Secondary | ICD-10-CM | POA: Diagnosis not present

## 2021-11-16 HISTORY — PX: CYSTOSCOPY/URETEROSCOPY/HOLMIUM LASER/STENT PLACEMENT: SHX6546

## 2021-11-16 LAB — BASIC METABOLIC PANEL
Anion gap: 7 (ref 5–15)
BUN: 16 mg/dL (ref 6–20)
CO2: 23 mmol/L (ref 22–32)
Calcium: 9 mg/dL (ref 8.9–10.3)
Chloride: 111 mmol/L (ref 98–111)
Creatinine, Ser: 0.57 mg/dL — ABNORMAL LOW (ref 0.61–1.24)
GFR, Estimated: >60 mL/min (ref >60)
Glucose, Bld: 92 mg/dL (ref 70–99)
Potassium: 3.8 mmol/L (ref 3.5–5.1)
Sodium: 141 mmol/L (ref 135–145)

## 2021-11-16 SURGERY — CYSTOSCOPY/URETEROSCOPY/HOLMIUM LASER/STENT PLACEMENT
Anesthesia: General | Site: Renal | Laterality: Right

## 2021-11-16 MED ORDER — ONDANSETRON HCL 4 MG/2ML IJ SOLN
INTRAMUSCULAR | Status: DC | PRN
  Administered 2021-11-16: 4 mg via INTRAVENOUS

## 2021-11-16 MED ORDER — PROPOFOL 500 MG/50ML IV EMUL
INTRAVENOUS | Status: AC
  Filled 2021-11-16: qty 50

## 2021-11-16 MED ORDER — STERILE WATER FOR IRRIGATION IR SOLN
Status: DC | PRN
  Administered 2021-11-16: 500 mL

## 2021-11-16 MED ORDER — GENTAMICIN SULFATE 40 MG/ML IJ SOLN
5.0000 mg/kg | Freq: Once | INTRAVENOUS | Status: AC
  Administered 2021-11-16: 363.2 mg via INTRAVENOUS
  Filled 2021-11-16: qty 9

## 2021-11-16 MED ORDER — IOHEXOL 300 MG/ML  SOLN
INTRAMUSCULAR | Status: DC | PRN
  Administered 2021-11-16: 10 mL via URETHRAL

## 2021-11-16 MED ORDER — DEXAMETHASONE SODIUM PHOSPHATE 10 MG/ML IJ SOLN
INTRAMUSCULAR | Status: DC | PRN
  Administered 2021-11-16: 8 mg via INTRAVENOUS

## 2021-11-16 MED ORDER — EPHEDRINE SULFATE (PRESSORS) 50 MG/ML IJ SOLN
INTRAMUSCULAR | Status: DC | PRN
  Administered 2021-11-16: 10 mg via INTRAVENOUS

## 2021-11-16 MED ORDER — MIDAZOLAM HCL 5 MG/5ML IJ SOLN
INTRAMUSCULAR | Status: DC | PRN
  Administered 2021-11-16: 2 mg via INTRAVENOUS

## 2021-11-16 MED ORDER — CEFAZOLIN SODIUM-DEXTROSE 2-4 GM/100ML-% IV SOLN
2.0000 g | Freq: Once | INTRAVENOUS | Status: AC
  Administered 2021-11-16: 2 g via INTRAVENOUS
  Filled 2021-11-16: qty 100

## 2021-11-16 MED ORDER — LACTATED RINGERS IV SOLN: INTRAVENOUS | Status: DC

## 2021-11-16 MED ORDER — DEXAMETHASONE SODIUM PHOSPHATE 10 MG/ML IJ SOLN
INTRAMUSCULAR | Status: AC
  Filled 2021-11-16: qty 1

## 2021-11-16 MED ORDER — LABETALOL HCL 5 MG/ML IV SOLN
5.0000 mg | INTRAVENOUS | Status: DC | PRN
  Administered 2021-11-16: 5 mg via INTRAVENOUS

## 2021-11-16 MED ORDER — ORAL CARE MOUTH RINSE: 15.0000 mL | Freq: Once | OROMUCOSAL | Status: AC

## 2021-11-16 MED ORDER — FENTANYL CITRATE (PF) 100 MCG/2ML IJ SOLN
INTRAMUSCULAR | Status: DC | PRN
  Administered 2021-11-16: 100 ug via INTRAVENOUS

## 2021-11-16 MED ORDER — LIDOCAINE HCL (CARDIAC) PF 100 MG/5ML IV SOSY
PREFILLED_SYRINGE | INTRAVENOUS | Status: DC | PRN
  Administered 2021-11-16: 100 mg via INTRAVENOUS

## 2021-11-16 MED ORDER — MIDAZOLAM HCL 2 MG/2ML IJ SOLN
INTRAMUSCULAR | Status: AC
  Filled 2021-11-16: qty 2

## 2021-11-16 MED ORDER — CHLORHEXIDINE GLUCONATE 0.12 % MT SOLN
15.0000 mL | Freq: Once | OROMUCOSAL | Status: AC
  Administered 2021-11-16: 15 mL via OROMUCOSAL

## 2021-11-16 MED ORDER — ONDANSETRON HCL 4 MG/2ML IJ SOLN
INTRAMUSCULAR | Status: AC
  Filled 2021-11-16: qty 2

## 2021-11-16 MED ORDER — SODIUM CHLORIDE 0.9 % IR SOLN
Status: DC | PRN
  Administered 2021-11-16: 6000 mL via INTRAVESICAL

## 2021-11-16 MED ORDER — PROPOFOL 10 MG/ML IV BOLUS
INTRAVENOUS | Status: DC | PRN
  Administered 2021-11-16: 40 mg via INTRAVENOUS
  Administered 2021-11-16: 160 mg via INTRAVENOUS

## 2021-11-16 MED ORDER — FENTANYL CITRATE (PF) 250 MCG/5ML IJ SOLN
INTRAMUSCULAR | Status: AC
  Filled 2021-11-16: qty 5

## 2021-11-16 MED ORDER — LABETALOL HCL 5 MG/ML IV SOLN
INTRAVENOUS | Status: AC
  Filled 2021-11-16: qty 4

## 2021-11-16 MED ORDER — 0.9 % SODIUM CHLORIDE (POUR BTL) OPTIME
TOPICAL | Status: DC | PRN
  Administered 2021-11-16: 1000 mL

## 2021-11-16 MED ORDER — FENTANYL CITRATE PF 50 MCG/ML IJ SOSY: 25.0000 ug | PREFILLED_SYRINGE | INTRAMUSCULAR | Status: DC | PRN

## 2021-11-16 SURGICAL SUPPLY — 32 items
BAG DRN RND TRDRP ANRFLXCHMBR (UROLOGICAL SUPPLIES) ×1
BAG URINE DRAIN 2000ML AR STRL (UROLOGICAL SUPPLIES) ×1 IMPLANT
BAG URO CATCHER STRL LF (MISCELLANEOUS) ×2 IMPLANT
BASKET ZERO TIP NITINOL 2.4FR (BASKET) IMPLANT
CATH FOLEY 2WAY SLVR  5CC 16FR (CATHETERS) ×2
CATH FOLEY 2WAY SLVR 5CC 16FR (CATHETERS) IMPLANT
CATH INTERMIT  6FR 70CM (CATHETERS) ×1 IMPLANT
CATH URET 5FR 28IN CONE TIP (BALLOONS)
CATH URET 5FR 70CM CONE TIP (BALLOONS) IMPLANT
CATH URETL OPEN END 6FR 70 (CATHETERS) ×1 IMPLANT
CLOTH BEACON ORANGE TIMEOUT ST (SAFETY) ×2 IMPLANT
GLOVE BIO SURGEON STRL SZ7.5 (GLOVE) ×3 IMPLANT
GOWN STRL REUS W/ TWL XL LVL3 (GOWN DISPOSABLE) ×1 IMPLANT
GOWN STRL REUS W/TWL XL LVL3 (GOWN DISPOSABLE) ×4
GUIDEWIRE STR DUAL SENSOR (WIRE) ×2 IMPLANT
GUIDEWIRE ZIPWRE .038 STRAIGHT (WIRE) ×1 IMPLANT
IV NS IRRIG 3000ML ARTHROMATIC (IV SOLUTION) ×2 IMPLANT
KIT TURNOVER KIT A (KITS) ×1 IMPLANT
LASER FIB FLEXIVA PULSE ID 365 (Laser) IMPLANT
MANIFOLD NEPTUNE II (INSTRUMENTS) ×2 IMPLANT
NS IRRIG 1000ML POUR BTL (IV SOLUTION) ×1 IMPLANT
PACK CYSTO (CUSTOM PROCEDURE TRAY) ×2 IMPLANT
SHEATH NAVIGATOR HD 11/13X28 (SHEATH) IMPLANT
SHEATH NAVIGATOR HD 11/13X36 (SHEATH) IMPLANT
SHEATH NAVIGATOR HD 12/14X46 (SHEATH) ×1 IMPLANT
STENT URET 6FRX26 CONTOUR (STENTS) ×1 IMPLANT
SYR 10ML LL (SYRINGE) ×1 IMPLANT
TRACTIP FLEXIVA PULS ID 200XHI (Laser) IMPLANT
TRACTIP FLEXIVA PULSE ID 200 (Laser)
TUBING CONNECTING 10 (TUBING) ×2 IMPLANT
TUBING UROLOGY SET (TUBING) ×2 IMPLANT
WATER STERILE IRR 500ML POUR (IV SOLUTION) ×1 IMPLANT

## 2021-11-16 NOTE — Transfer of Care (Signed)
Immediate Anesthesia Transfer of Care Note  Patient: Ryan Watkins  Procedure(s) Performed: CYSTOSCOPY/ RIGHT URETEROSCOPY/HOLMIUM LASER/RETROGRADE/STENT EXCHANGE (Right: Renal)  Patient Location: PACU  Anesthesia Type:General  Level of Consciousness: awake, alert  and oriented  Airway & Oxygen Therapy: Patient Spontanous Breathing and Patient connected to face mask oxygen  Post-op Assessment: Report given to RN and Post -op Vital signs reviewed and stable  Post vital signs: Reviewed and stable  Last Vitals:  Vitals Value Taken Time  BP 186/121 11/16/21 1203  Temp 36.4 C 11/16/21 1203  Pulse 54 11/16/21 1207  Resp 16 11/16/21 1207  SpO2 100 % 11/16/21 1207  Vitals shown include unvalidated device data.  Last Pain:  Vitals:   11/16/21 1203  TempSrc:   PainSc: 0-No pain         Complications: No notable events documented.

## 2021-11-16 NOTE — Interval H&P Note (Signed)
History and Physical Interval Note:  11/16/2021 10:00 AM  Ryan Watkins  has presented today for surgery, with the diagnosis of RIGHT RENAL STONES.  The various methods of treatment have been discussed with the patient and family. After consideration of risks, benefits and other options for treatment, the patient has consented to  Procedure(s): CYSTOSCOPY/URETEROSCOPY/HOLMIUM LASER/STENT PLACEMENT (Right) as a surgical intervention.  The patient's history has been reviewed, patient examined, no change in status, stable for surgery.  I have reviewed the patient's chart and labs.  He gets some autonomic dysreflexia symptoms and sweating with the stent but has not had any fever.  Urine has been clear.  He has been on a nightly nitrofurantoin.  Questions were answered to the patient's satisfaction.  Again discussed difficult and tight access to the kidney and he may need a staged procedure.   [name]

## 2021-11-16 NOTE — Discharge Instructions (Signed)
Remove the Foley catheter (and the stent which is taped to it) on Wednesday, November 21, 2021.

## 2021-11-16 NOTE — Op Note (Signed)
Preoperative diagnosis: Right renal stones Postoperative diagnosis: Right renal stones  Procedure: Cystoscopy with right ureteroscopy laser lithotripsy, right retrograde pyelogram, right ureteral stent exchange  Surgeon: Mena Goes Assistant: Herschell Dimes  Anesthesia: General  Indication for procedure: Ryan Watkins is a 36 year old male with a history of neurogenic bladder and right renal stone.  He underwent Botox recently and retrograde revealed enlarging right renal stone(s).  Findings: Stone or stones noted in the right lower pole.  All visible stones were dusted.  No significant fragments remained.  Right proximal ureter quite tight.  I got the inner part of the access sheath up to the proximal ureter but not into the kidney.  The access sheath itself just got into the proximal ureter and I could not get it any further.  I just was able to get the single channel digital ureteroscope into the kidney, but it was tight and took some slow steady pressure.  On the way out after removal of the access sheath there was no ureteral injury.  There was not even one area of mucosal damage.  Right retrograde pyelogram-this outlined the collecting system mainly the upper and middle pole.  Description of procedure: After consent was obtained patient brought the operating room.  After adequate anesthesia was placed in lithotomy position and prepped and draped in the usual sterile fashion.  Timeout was performed to confirm the patient and procedure.  Cystoscope was passed urethra and the bladder inspected.  I irrigated the bladder several times until the irrigant was nice and clear.  The right ureteral stent was grasped and removed through the urethral meatus and a sensor wire was advanced and coiled in the upper calyx.  The stent was removed.  I then used the inner cannula of the medium access sheath and passed it up into the proximal ureter but could not quite get it through and into the kidney.  We then used the access  sheath to get 2 wires in place and went adjacent to a Glidewire leaving it as the safety wire.  The single channel digital ureteroscope was then advanced and it was slow but with slow steady pressure we guided that into the kidney.  Initially we could not locate the stones and we shot a retrograde through the scope.  This outlined the upper calyx in the middle calyx and as we began to angle further downward we realized we were on top of the stone and it was sitting over the lower pole infundibulum.  Once located we were able to get some room around and passed a 200 m Moses laser fiber.  The stone was then dusted at 0.2 and 60.  Fortunately was soft and dusted very well.  We then carefully inspected all the lower pole calyces and found no other stones or significant stone fragment.  We then went back to the mid and upper pole calyces and again no other stones were noted.  The access sheath was then backed out on the ureteroscope and we carefully inspected the collecting system the renal pelvis the UPJ and the ureter on the way out and saw no sign of any injury or any significant stone fragment.  The wire was backloaded on the cystoscope 6 x 26 cm stent was advanced.  The wire was removed with a good coil seen in the kidney and good coil in the bladder.  We left the string on the stent.  A 16 French Foley catheter was placed and left to gravity drainage to the string was taped  to the Foley.  He was then awakened taken recovery room in stable condition.  Complications: None  Blood loss: Minimal  Specimens: None  Drains: 6 x 26 cm right ureteral stent, 16 French Foley catheter  Disposition: Patient stable to PACU.  I discussed the procedure, postop care and follow-up with his wife.  His twin daughters are now 34 months old.

## 2021-11-16 NOTE — Anesthesia Postprocedure Evaluation (Signed)
Anesthesia Post Note  Patient: Ryan Watkins  Procedure(s) Performed: CYSTOSCOPY/ RIGHT URETEROSCOPY/HOLMIUM LASER/RETROGRADE/STENT EXCHANGE (Right: Renal)     Patient location during evaluation: PACU Anesthesia Type: General Level of consciousness: awake Pain management: pain level controlled Vital Signs Assessment: post-procedure vital signs reviewed and stable Respiratory status: spontaneous breathing Cardiovascular status: stable Postop Assessment: no apparent nausea or vomiting Anesthetic complications: no   No notable events documented.  Last Vitals:  Vitals:   11/16/21 1315 11/16/21 1320  BP: (!) 124/93 (!) 123/98  Pulse: 89 93  Resp: 14 18  Temp:    SpO2: 95% 94%    Last Pain:  Vitals:   11/16/21 1315  TempSrc:   PainSc: 0-No pain                 Makailee Nudelman

## 2021-11-16 NOTE — Anesthesia Preprocedure Evaluation (Addendum)
Anesthesia Evaluation  Patient identified by MRN, date of birth, ID band Patient awake    Reviewed: Allergy & Precautions, NPO status , Patient's Chart, lab work & pertinent test results  Airway Mallampati: II  TM Distance: >3 FB     Dental   Pulmonary asthma , pneumonia,    breath sounds clear to auscultation       Cardiovascular  Rhythm:Regular Rate:Normal  Hx noted Dr. Chilton Si   Neuro/Psych negative neurological ROS     GI/Hepatic negative GI ROS, Neg liver ROS,   Endo/Other    Renal/GU Renal disease     Musculoskeletal   Abdominal   Peds  Hematology   Anesthesia Other Findings   Reproductive/Obstetrics                            Anesthesia Physical Anesthesia Plan  ASA: 3  Anesthesia Plan: General   Post-op Pain Management:    Induction: Intravenous  PONV Risk Score and Plan: 2 and Ondansetron and Dexamethasone  Airway Management Planned: LMA  Additional Equipment:   Intra-op Plan:   Post-operative Plan: Extubation in OR  Informed Consent: I have reviewed the patients History and Physical, chart, labs and discussed the procedure including the risks, benefits and alternatives for the proposed anesthesia with the patient or authorized representative who has indicated his/her understanding and acceptance.     Dental advisory given  Plan Discussed with: CRNA and Anesthesiologist  Anesthesia Plan Comments:         Anesthesia Quick Evaluation

## 2021-11-17 ENCOUNTER — Encounter (HOSPITAL_COMMUNITY): Payer: Self-pay | Admitting: Urology

## 2022-04-23 ENCOUNTER — Other Ambulatory Visit: Payer: Self-pay | Admitting: Urology

## 2022-05-07 ENCOUNTER — Encounter (HOSPITAL_COMMUNITY): Payer: Medicare Other

## 2022-05-07 ENCOUNTER — Other Ambulatory Visit: Payer: Self-pay | Admitting: Urology

## 2022-05-07 NOTE — Progress Notes (Signed)
For Short Stay: COVID SWAB appointment date: N/A Date of COVID positive in last 90 days: N/A   Bowel Prep reminder: N/A     For Anesthesia: PCP - Kip A Corrington, MD Cardiologist - N/A   Chest x-ray - Greater than 1 year in epic EKG - N/A Stress Test - N/A ECHO - 10/05/21 in epic Cardiac Cath - N/A Pacemaker/ICD device last checked:N/A Pacemaker orders received:N/A Device Rep notified:N/A   Spinal Cord Stimulator: N/A   Sleep Study - N/A CPAP - N/A   Fasting Blood Sugar - N/A Checks Blood Sugar ___N/A__ times a day Date and result of last Hgb A1c-N/A   Blood Thinner Instructions:N/A  Aspirin Instructions: N/A Last Dose: N/A   Activity level: patient quadriplegic                            Anesthesia review: N/A   Patient denies shortness of breath, fever, cough and chest pain at PAT appointment     Patient verbalized understanding of instructions that were given to him via telephone.

## 2022-05-08 ENCOUNTER — Other Ambulatory Visit: Payer: Self-pay

## 2022-05-08 ENCOUNTER — Encounter (HOSPITAL_COMMUNITY): Payer: Self-pay | Admitting: Urology

## 2022-05-08 NOTE — Progress Notes (Signed)
Medical hx and preop instructions completed via phone call on 05/08/22.  Pt made aware at time of phone call to call pharmacy today to reconcile meds.  PT has number.
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
## 2022-05-16 NOTE — Anesthesia Preprocedure Evaluation (Addendum)
Anesthesia Evaluation  Patient identified by MRN, date of birth, ID band Patient awake    Reviewed: Allergy & Precautions, NPO status , Patient's Chart, lab work & pertinent test results  Airway Mallampati: II  TM Distance: >3 FB Neck ROM: Full    Dental  (+) Teeth Intact, Dental Advisory Given   Pulmonary asthma    Pulmonary exam normal breath sounds clear to auscultation       Cardiovascular negative cardio ROS Normal cardiovascular exam Rhythm:Regular Rate:Normal     Neuro/Psych Quadriplegia C4 2005  Neuromuscular disease  negative psych ROS   GI/Hepatic negative GI ROS, Neg liver ROS,,,  Endo/Other  negative endocrine ROS    Renal/GU Renal disease Bladder dysfunction  Neurogenic bladder, self caths QID    Musculoskeletal negative musculoskeletal ROS (+)    Abdominal   Peds  Hematology negative hematology ROS (+)   Anesthesia Other Findings   Reproductive/Obstetrics negative OB ROS                             Anesthesia Physical Anesthesia Plan  ASA: 3  Anesthesia Plan: General   Post-op Pain Management:    Induction: Intravenous  PONV Risk Score and Plan: 3 and Ondansetron, Dexamethasone, Midazolam and Treatment may vary due to age or medical condition  Airway Management Planned: LMA  Additional Equipment: None  Intra-op Plan:   Post-operative Plan: Extubation in OR  Informed Consent: I have reviewed the patients History and Physical, chart, labs and discussed the procedure including the risks, benefits and alternatives for the proposed anesthesia with the patient or authorized representative who has indicated his/her understanding and acceptance.     Dental advisory given  Plan Discussed with: CRNA  Anesthesia Plan Comments: (LMA 4 used in past )       Anesthesia Quick Evaluation

## 2022-05-17 ENCOUNTER — Encounter (HOSPITAL_COMMUNITY): Payer: Self-pay | Admitting: Urology

## 2022-05-17 ENCOUNTER — Ambulatory Visit (HOSPITAL_COMMUNITY): Payer: BC Managed Care – PPO | Admitting: Certified Registered Nurse Anesthetist

## 2022-05-17 ENCOUNTER — Other Ambulatory Visit: Payer: Self-pay

## 2022-05-17 ENCOUNTER — Encounter (HOSPITAL_COMMUNITY)
Admission: RE | Disposition: A | Discharge status: Home / Self Care | Payer: Self-pay | Source: Ambulatory Visit | Attending: Urology

## 2022-05-17 ENCOUNTER — Ambulatory Visit (HOSPITAL_COMMUNITY)
Admission: RE | Admit: 2022-05-17 | Discharge: 2022-05-17 | Disposition: A | Discharge status: Home / Self Care | Payer: BC Managed Care – PPO | Source: Ambulatory Visit | Attending: Urology | Admitting: Urology

## 2022-05-17 DIAGNOSIS — G8250 Quadriplegia, unspecified: Secondary | ICD-10-CM | POA: Diagnosis not present

## 2022-05-17 DIAGNOSIS — N319 Neuromuscular dysfunction of bladder, unspecified: Secondary | ICD-10-CM | POA: Insufficient documentation

## 2022-05-17 DIAGNOSIS — N200 Calculus of kidney: Secondary | ICD-10-CM | POA: Diagnosis not present

## 2022-05-17 DIAGNOSIS — N3020 Other chronic cystitis without hematuria: Secondary | ICD-10-CM | POA: Diagnosis not present

## 2022-05-17 DIAGNOSIS — N289 Disorder of kidney and ureter, unspecified: Secondary | ICD-10-CM

## 2022-05-17 DIAGNOSIS — D649 Anemia, unspecified: Secondary | ICD-10-CM

## 2022-05-17 HISTORY — PX: BOTOX INJECTION: SHX5754

## 2022-05-17 LAB — CBC
HCT: 44.5 % (ref 39.0–52.0)
Hemoglobin: 13.8 g/dL (ref 13.0–17.0)
MCH: 28.8 pg (ref 26.0–34.0)
MCHC: 31 g/dL (ref 30.0–36.0)
MCV: 92.7 fL (ref 80.0–100.0)
Platelets: 209 10*3/uL (ref 150–400)
RBC: 4.8 MIL/uL (ref 4.22–5.81)
RDW: 15.5 % (ref 11.5–15.5)
WBC: 6.2 10*3/uL (ref 4.0–10.5)
nRBC: 0 % (ref 0.0–0.2)

## 2022-05-17 LAB — BASIC METABOLIC PANEL
Anion gap: 7 (ref 5–15)
BUN: 20 mg/dL (ref 6–20)
CO2: 22 mmol/L (ref 22–32)
Calcium: 8.5 mg/dL — ABNORMAL LOW (ref 8.9–10.3)
Chloride: 107 mmol/L (ref 98–111)
Creatinine, Ser: 0.59 mg/dL — ABNORMAL LOW (ref 0.61–1.24)
GFR, Estimated: >60 mL/min (ref >60)
Glucose, Bld: 101 mg/dL — ABNORMAL HIGH (ref 70–99)
Potassium: 3.8 mmol/L (ref 3.5–5.1)
Sodium: 136 mmol/L (ref 135–145)

## 2022-05-17 SURGERY — BOTOX INJECTION
Anesthesia: General

## 2022-05-17 MED ORDER — ORAL CARE MOUTH RINSE: 15.0000 mL | Freq: Once | OROMUCOSAL | Status: AC

## 2022-05-17 MED ORDER — OXYCODONE HCL 5 MG PO TABS: 5.0000 mg | ORAL_TABLET | Freq: Once | ORAL | Status: DC | PRN

## 2022-05-17 MED ORDER — CEFAZOLIN SODIUM-DEXTROSE 2-4 GM/100ML-% IV SOLN
2.0000 g | INTRAVENOUS | Status: AC
  Administered 2022-05-17: 2 g via INTRAVENOUS
  Filled 2022-05-17: qty 100

## 2022-05-17 MED ORDER — ONDANSETRON HCL 4 MG/2ML IJ SOLN
INTRAMUSCULAR | Status: AC
  Filled 2022-05-17: qty 2

## 2022-05-17 MED ORDER — SODIUM CHLORIDE (PF) 0.9 % IJ SOLN
INTRAMUSCULAR | Status: AC
  Filled 2022-05-17: qty 30

## 2022-05-17 MED ORDER — ONDANSETRON HCL 4 MG/2ML IJ SOLN
INTRAMUSCULAR | Status: DC | PRN
  Administered 2022-05-17: 4 mg via INTRAVENOUS

## 2022-05-17 MED ORDER — ONABOTULINUMTOXINA 100 UNITS IJ SOLR
INTRAMUSCULAR | Status: AC
  Filled 2022-05-17: qty 100

## 2022-05-17 MED ORDER — FENTANYL CITRATE PF 50 MCG/ML IJ SOSY: 25.0000 ug | PREFILLED_SYRINGE | INTRAMUSCULAR | Status: DC | PRN

## 2022-05-17 MED ORDER — ONABOTULINUMTOXINA 100 UNITS IJ SOLR
300.0000 [IU] | Freq: Once | INTRAMUSCULAR | Status: DC
  Filled 2022-05-17: qty 300

## 2022-05-17 MED ORDER — ONABOTULINUMTOXINA 100 UNITS IJ SOLR
INTRAMUSCULAR | Status: DC | PRN
  Administered 2022-05-17: 300 [IU] via INTRAMUSCULAR

## 2022-05-17 MED ORDER — AMISULPRIDE (ANTIEMETIC) 5 MG/2ML IV SOLN: 10.0000 mg | Freq: Once | INTRAVENOUS | Status: DC | PRN

## 2022-05-17 MED ORDER — DEXAMETHASONE SODIUM PHOSPHATE 4 MG/ML IJ SOLN
INTRAMUSCULAR | Status: DC | PRN
  Administered 2022-05-17: 10 mg via INTRAVENOUS

## 2022-05-17 MED ORDER — CHLORHEXIDINE GLUCONATE 0.12 % MT SOLN
15.0000 mL | Freq: Once | OROMUCOSAL | Status: AC
  Administered 2022-05-17: 15 mL via OROMUCOSAL

## 2022-05-17 MED ORDER — PHENYLEPHRINE HCL (PRESSORS) 10 MG/ML IV SOLN
INTRAVENOUS | Status: DC | PRN
  Administered 2022-05-17: 80 ug via INTRAVENOUS

## 2022-05-17 MED ORDER — STERILE WATER FOR IRRIGATION IR SOLN
Status: DC | PRN
  Administered 2022-05-17: 1000 mL

## 2022-05-17 MED ORDER — SODIUM CHLORIDE (PF) 0.9 % IJ SOLN
INTRAMUSCULAR | Status: DC | PRN
  Administered 2022-05-17: 30 mL

## 2022-05-17 MED ORDER — MIDAZOLAM HCL 5 MG/5ML IJ SOLN
INTRAMUSCULAR | Status: DC | PRN
  Administered 2022-05-17: 2 mg via INTRAVENOUS

## 2022-05-17 MED ORDER — MIDAZOLAM HCL 2 MG/2ML IJ SOLN
INTRAMUSCULAR | Status: AC
  Filled 2022-05-17: qty 2

## 2022-05-17 MED ORDER — GENTAMICIN SULFATE 40 MG/ML IJ SOLN
5.0000 mg/kg | Freq: Once | INTRAVENOUS | Status: AC
  Administered 2022-05-17: 340 mg via INTRAVENOUS
  Filled 2022-05-17: qty 8.5

## 2022-05-17 MED ORDER — FENTANYL CITRATE (PF) 100 MCG/2ML IJ SOLN
INTRAMUSCULAR | Status: DC | PRN
  Administered 2022-05-17: 50 ug via INTRAVENOUS

## 2022-05-17 MED ORDER — ONDANSETRON HCL 4 MG/2ML IJ SOLN: 4.0000 mg | Freq: Once | INTRAMUSCULAR | Status: DC | PRN

## 2022-05-17 MED ORDER — OXYCODONE HCL 5 MG/5ML PO SOLN: 5.0000 mg | Freq: Once | ORAL | Status: DC | PRN

## 2022-05-17 MED ORDER — LIDOCAINE HCL (PF) 2 % IJ SOLN
INTRAMUSCULAR | Status: AC
  Filled 2022-05-17: qty 5

## 2022-05-17 MED ORDER — PROPOFOL 10 MG/ML IV BOLUS
INTRAVENOUS | Status: AC
  Filled 2022-05-17: qty 20

## 2022-05-17 MED ORDER — DEXAMETHASONE SODIUM PHOSPHATE 10 MG/ML IJ SOLN
INTRAMUSCULAR | Status: AC
  Filled 2022-05-17: qty 1

## 2022-05-17 MED ORDER — PROPOFOL 10 MG/ML IV BOLUS
INTRAVENOUS | Status: DC | PRN
  Administered 2022-05-17: 200 mg via INTRAVENOUS

## 2022-05-17 MED ORDER — FENTANYL CITRATE (PF) 100 MCG/2ML IJ SOLN
INTRAMUSCULAR | Status: AC
  Filled 2022-05-17: qty 2

## 2022-05-17 MED ORDER — LACTATED RINGERS IV SOLN: INTRAVENOUS | Status: DC

## 2022-05-17 MED ORDER — LIDOCAINE HCL (CARDIAC) PF 100 MG/5ML IV SOSY
PREFILLED_SYRINGE | INTRAVENOUS | Status: DC | PRN
  Administered 2022-05-17: 40 mg via INTRAVENOUS

## 2022-05-17 MED ORDER — SODIUM CHLORIDE (PF) 0.9 % IJ SOLN
INTRAMUSCULAR | Status: AC
  Filled 2022-05-17: qty 10

## 2022-05-17 MED ORDER — ONABOTULINUMTOXINA 100 UNITS IJ SOLR
INTRAMUSCULAR | Status: AC
  Filled 2022-05-17: qty 200

## 2022-05-17 SURGICAL SUPPLY — 17 items
BAG URO CATCHER STRL LF (MISCELLANEOUS) ×1 IMPLANT
CATH FOLEY 2WAY SLVR  5CC 16FR (CATHETERS) ×1
CATH FOLEY 2WAY SLVR 5CC 16FR (CATHETERS) IMPLANT
CLOTH BEACON ORANGE TIMEOUT ST (SAFETY) ×1 IMPLANT
GLOVE SURG LX STRL 7.5 STRW (GLOVE) ×1 IMPLANT
GOWN STRL REUS W/ TWL XL LVL3 (GOWN DISPOSABLE) ×1 IMPLANT
GOWN STRL REUS W/TWL XL LVL3 (GOWN DISPOSABLE) ×1
KIT TURNOVER KIT A (KITS) IMPLANT
MANIFOLD NEPTUNE II (INSTRUMENTS) ×1 IMPLANT
NDL ASPIRATION 22 (NEEDLE) ×1 IMPLANT
NDL SAFETY ECLIP 18X1.5 (MISCELLANEOUS) IMPLANT
NEEDLE ASPIRATION 22 (NEEDLE) ×1 IMPLANT
PACK CYSTO (CUSTOM PROCEDURE TRAY) ×1 IMPLANT
PENCIL SMOKE EVACUATOR (MISCELLANEOUS) IMPLANT
SYR CONTROL 10ML LL (SYRINGE) IMPLANT
TUBING CONNECTING 10 (TUBING) IMPLANT
WATER STERILE IRR 3000ML UROMA (IV SOLUTION) ×1 IMPLANT

## 2022-05-17 NOTE — Progress Notes (Signed)
Pt has pre-existing sacral and groin wounds and wound hx

## 2022-05-17 NOTE — Discharge Instructions (Signed)
Removal of Foley-remove the Foley catheter in 2 to 3 days.

## 2022-05-17 NOTE — Transfer of Care (Signed)
Immediate Anesthesia Transfer of Care Note  Patient: Ryan Watkins  Procedure(s) Performed: CYSTOSCOPY BOTOX INJECTION  Patient Location: PACU  Anesthesia Type:General  Level of Consciousness: awake, alert , and oriented  Airway & Oxygen Therapy: Patient Spontanous Breathing and Patient connected to face mask oxygen  Post-op Assessment: Report given to RN and Post -op Vital signs reviewed and stable  Post vital signs: Reviewed and stable  Last Vitals:  Vitals Value Taken Time  BP 137/87 05/17/22 1108  Temp    Pulse    Resp 12 05/17/22 1109  SpO2    Vitals shown include unvalidated device data.  Last Pain:  Vitals:   05/17/22 0845  TempSrc: Oral  PainSc:          Complications: No notable events documented.

## 2022-05-17 NOTE — Anesthesia Procedure Notes (Signed)
Procedure Name: LMA Insertion Date/Time: 05/17/2022 10:38 AM  Performed by: Bufford Spikes, CRNAPre-anesthesia Checklist: Patient identified, Emergency Drugs available, Suction available and Patient being monitored Patient Re-evaluated:Patient Re-evaluated prior to induction Oxygen Delivery Method: Circle system utilized Preoxygenation: Pre-oxygenation with 100% oxygen Induction Type: IV induction Ventilation: Mask ventilation without difficulty LMA: LMA inserted LMA Size: 4.0 Number of attempts: 1 Placement Confirmation: positive ETCO2 Tube secured with: Tape Dental Injury: Teeth and Oropharynx as per pre-operative assessment

## 2022-05-17 NOTE — Op Note (Signed)
Preoperative diagnosis: Neurogenic bladder Postop diagnosis: Same  Procedure: Cystoscopy with Bladder Botox injection  Surgeon: Junious Silk  Anesthesia: General  Indication for procedure: Harlon is a 37 year old male quadriplegic with neurogenic bladder.  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  Findings: Urethra unremarkable on cystoscopy, some small stone fragments in the bladder.  No mucosal lesions.  Description of procedure: After consent was obtained patient brought to the operating room.  After adequate anesthesia he was placed in lithotomy position and prepped and draped in the usual sterile fashion.  Timeout was performed to confirm the patient and procedure.  The cystoscope with the Bostock sheath and needle were passed per urethra.  The bladder was irrigated several times.  The bladder was filled to about 200 cc and 300 units of Botox was injected with 30 one-mL injections at 10 units/mL.  Good coverage of the bladder.  Hemostasis was excellent at low pressure.  Bladder was filled again and the scope removed.  A 16 French Foley catheter was placed and left to gravity drainage.  He was awakened taken recovery room in stable condition.  Complications: None  Blood loss: Minimal  Specimens: None  Drains: 16 French Foley catheter  Disposition: Patient stable to PACU

## 2022-05-17 NOTE — Interval H&P Note (Signed)
History and Physical Interval Note:  05/17/2022 9:17 AM  Ryan Watkins  has presented today for surgery, with the diagnosis of NEUROGENIC  BLADDER.  The various methods of treatment have been discussed with the patient and family. After consideration of risks, benefits and other options for treatment, the patient has consented to  Procedure(s) with comments: CYSTOSCOPY BOTOX INJECTION (N/A) - 30 MINS FOR CASE as a surgical intervention.  The patient's history has been reviewed, patient examined, no change in status, stable for surgery.  I have reviewed the patient's chart and labs.  Questions were answered to the patient's satisfaction.  He is on abx. Urine clear. No fever. Having sensation of bladder discomfort and feeling hot in between CIC.    [name]

## 2022-05-17 NOTE — Anesthesia Postprocedure Evaluation (Signed)
Anesthesia Post Note  Patient: Ryan Watkins  Procedure(s) Performed: CYSTOSCOPY BOTOX INJECTION     Patient location during evaluation: PACU Anesthesia Type: General Level of consciousness: awake and alert, oriented and patient cooperative Pain management: pain level controlled Vital Signs Assessment: post-procedure vital signs reviewed and stable Respiratory status: spontaneous breathing, nonlabored ventilation and respiratory function stable Cardiovascular status: blood pressure returned to baseline and stable Postop Assessment: no apparent nausea or vomiting Anesthetic complications: no   No notable events documented.  Last Vitals:  Vitals:   05/17/22 1130 05/17/22 1146  BP: 106/77 104/78  Pulse: 62   Resp: 12 20  Temp:  (!) 36.4 C  SpO2: 97% 99%    Last Pain:  Vitals:   05/17/22 1146  TempSrc: Oral  PainSc: 0-No pain                 Pervis Hocking

## 2022-05-18 ENCOUNTER — Encounter (HOSPITAL_COMMUNITY): Payer: Self-pay | Admitting: Urology

## 2023-01-17 ENCOUNTER — Other Ambulatory Visit: Payer: Self-pay | Admitting: Urology

## 2023-02-05 NOTE — Patient Instructions (Signed)
DUE TO COVID-19 ONLY TWO VISITORS  (aged 36 and older)  ARE ALLOWED TO COME WITH YOU AND STAY IN THE WAITING ROOM ONLY DURING PRE OP AND PROCEDURE.   **NO VISITORS ARE ALLOWED IN THE SHORT STAY AREA OR RECOVERY ROOM!!**  IF YOU WILL BE ADMITTED INTO THE HOSPITAL YOU ARE ALLOWED ONLY FOUR SUPPORT PEOPLE DURING VISITATION HOURS ONLY (7 AM -8PM)   The support person(s) must pass our screening, gel in and out, and wear a mask at all times, including in the patient's room. Patients must also wear a mask when staff or their support person are in the room. Visitors GUEST BADGE MUST BE WORN VISIBLY  One adult visitor may remain with you overnight and MUST be in the room by 8 P.M.     Your procedure is scheduled on: 02/25/23   Report to [facility] Main Entrance    Report to admitting at : 11:00 AM   Call this number if you have problems the morning of surgery [phone]   Do not eat food or drink: After Midnight.  FOLLOW ANY ADDITIONAL PRE OP INSTRUCTIONS YOU RECEIVED FROM YOUR SURGEON'S OFFICE!!!   Oral Hygiene is also important to reduce your risk of infection.                                    Remember - BRUSH YOUR TEETH THE MORNING OF SURGERY WITH YOUR REGULAR TOOTHPASTE  DENTURES WILL BE REMOVED PRIOR TO SURGERY PLEASE DO NOT APPLY "Poly grip" OR ADHESIVES!!!   Do NOT smoke after Midnight   Take these medicines the morning of surgery with A SIP OF WATER: N/A                              You may not have any metal on your body including hair pins, jewelry, and body piercing             Do not wear lotions, powders, perfumes/cologne, or deodorant              Men may shave face and neck.   Do not bring valuables to the hospital. [facility] IS NOT             RESPONSIBLE   FOR VALUABLES.   Contacts, glasses, or bridgework may not be worn into surgery.   Bring small overnight bag day of surgery.   DO NOT BRING YOUR HOME MEDICATIONS TO THE HOSPITAL. PHARMACY WILL  DISPENSE MEDICATIONS LISTED ON YOUR MEDICATION LIST TO YOU DURING YOUR ADMISSION IN THE HOSPITAL!    Patients discharged on the day of surgery will not be allowed to drive home.  Someone NEEDS to stay with you for the first 24 hours after anesthesia.   Special Instructions: Bring a copy of your healthcare power of attorney and living will documents         the day of surgery if you haven't scanned them before.              Please read over the following fact sheets you were given: IF YOU HAVE QUESTIONS ABOUT YOUR PRE-OP INSTRUCTIONS PLEASE CALL [phone]    [facility] - Preparing for Surgery Before surgery, you can play an important role.  Because skin is not sterile, your skin needs to be as free of germs as possible.  You can reduce  the number of germs on your skin by washing with CHG (chlorahexidine gluconate) soap before surgery.  CHG is an antiseptic cleaner which kills germs and bonds with the skin to continue killing germs even after washing. Please DO NOT use if you have an allergy to CHG or antibacterial soaps.  If your skin becomes reddened/irritated stop using the CHG and inform your nurse when you arrive at Short Stay. Do not shave (including legs and underarms) for at least 48 hours prior to the first CHG shower.  You may shave your face/neck. Please follow these instructions carefully:  1.  Shower with CHG Soap the night before surgery and the  morning of Surgery.  2.  If you choose to wash your hair, wash your hair first as usual with your  normal  shampoo.  3.  After you shampoo, rinse your hair and body thoroughly to remove the  shampoo.                           4.  Use CHG as you would any other liquid soap.  You can apply chg directly  to the skin and wash                       Gently with a scrungie or clean washcloth.  5.  Apply the CHG Soap to your body ONLY FROM THE NECK DOWN.   Do not use on face/ open                           Wound or open sores. Avoid contact with  eyes, ears mouth and genitals (private parts).                       Wash face,  Genitals (private parts) with your normal soap.             6.  Wash thoroughly, paying special attention to the area where your surgery  will be performed.  7.  Thoroughly rinse your body with warm water from the neck down.  8.  DO NOT shower/wash with your normal soap after using and rinsing off  the CHG Soap.                9.  Pat yourself dry with a clean towel.            10.  Wear clean pajamas.            11.  Place clean sheets on your bed the night of your first shower and do not  sleep with pets. Day of Surgery : Do not apply any lotions/deodorants the morning of surgery.  Please wear clean clothes to the hospital/surgery center.  FAILURE TO FOLLOW THESE INSTRUCTIONS MAY RESULT IN THE CANCELLATION OF YOUR SURGERY PATIENT SIGNATURE_________________________________  NURSE SIGNATURE__________________________________  ________________________________________________________________________

## 2023-02-05 NOTE — Progress Notes (Signed)
For Short Stay: COVID SWAB appointment date:  Bowel Prep reminder:   For Anesthesia: PCP - Corrington, Kip A, MD  Cardiologist - N/A  Chest x-ray -  EKG -  Stress Test -  ECHO - 10/05/21 Cardiac Cath -  Pacemaker/ICD device last checked: Pacemaker orders received: Device Rep notified:  Spinal Cord Stimulator:  Sleep Study - N/A CPAP -   Fasting Blood Sugar - N/A Checks Blood Sugar _____ times a day Date and result of last Hgb A1c-  Last dose of GLP1 agonist- N/A GLP1 instructions:   Last dose of SGLT-2 inhibitors- N/A SGLT-2 instructions:   Blood Thinner Instructions: N/A Aspirin Instructions: Last Dose:  Activity level: Can go up a flight of stairs and activities of daily living without stopping and without chest pain and/or shortness of breath   Unable to go up a flight of stairs due to quadriplegia.   Anesthesia review: Hx: Heart murmur,quadriplegia.  Patient denies shortness of breath, fever, cough and chest pain at PAT appointment   Patient verbalized understanding of instructions that were given to them at the PAT appointment. Patient was also instructed that they will need to review over the PAT instructions again at home before surgery.

## 2023-02-06 ENCOUNTER — Other Ambulatory Visit: Payer: Self-pay

## 2023-02-06 ENCOUNTER — Encounter (HOSPITAL_COMMUNITY)
Admission: RE | Admit: 2023-02-06 | Discharge: 2023-02-06 | Disposition: A | Discharge status: Home / Self Care | Payer: BC Managed Care – PPO | Source: Ambulatory Visit | Attending: Urology | Admitting: Urology

## 2023-02-06 ENCOUNTER — Encounter (HOSPITAL_COMMUNITY): Payer: Self-pay

## 2023-02-06 VITALS — Ht 72.0 in | Wt 160.0 lb

## 2023-02-06 DIAGNOSIS — I2510 Atherosclerotic heart disease of native coronary artery without angina pectoris: Secondary | ICD-10-CM

## 2023-02-06 HISTORY — DX: Palpitations: R00.2
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
## 2023-02-24 MED ORDER — SODIUM CHLORIDE 0.9 % IV SOLN: 1.5000 g | INTRAVENOUS | Status: DC

## 2023-02-25 ENCOUNTER — Encounter (HOSPITAL_COMMUNITY): Payer: Self-pay | Admitting: Urology

## 2023-02-25 ENCOUNTER — Encounter (HOSPITAL_COMMUNITY)
Admission: RE | Disposition: A | Discharge status: Home / Self Care | Payer: Self-pay | Source: Ambulatory Visit | Attending: Urology

## 2023-02-25 ENCOUNTER — Ambulatory Visit (HOSPITAL_COMMUNITY): Payer: BC Managed Care – PPO | Admitting: Physician Assistant

## 2023-02-25 ENCOUNTER — Ambulatory Visit (HOSPITAL_COMMUNITY)
Admission: RE | Admit: 2023-02-25 | Discharge: 2023-02-25 | Disposition: A | Discharge status: Home / Self Care | Payer: BC Managed Care – PPO | Source: Ambulatory Visit | Attending: Urology | Admitting: Urology

## 2023-02-25 ENCOUNTER — Other Ambulatory Visit: Payer: Self-pay

## 2023-02-25 DIAGNOSIS — Z87440 Personal history of urinary (tract) infections: Secondary | ICD-10-CM | POA: Diagnosis not present

## 2023-02-25 DIAGNOSIS — G8252 Quadriplegia, C1-C4 incomplete: Secondary | ICD-10-CM | POA: Diagnosis not present

## 2023-02-25 DIAGNOSIS — N319 Neuromuscular dysfunction of bladder, unspecified: Secondary | ICD-10-CM | POA: Insufficient documentation

## 2023-02-25 DIAGNOSIS — N3020 Other chronic cystitis without hematuria: Secondary | ICD-10-CM | POA: Insufficient documentation

## 2023-02-25 DIAGNOSIS — I2510 Atherosclerotic heart disease of native coronary artery without angina pectoris: Secondary | ICD-10-CM

## 2023-02-25 DIAGNOSIS — N3281 Overactive bladder: Secondary | ICD-10-CM | POA: Diagnosis not present

## 2023-02-25 DIAGNOSIS — S14154A Other incomplete lesion at C4 level of cervical spinal cord, initial encounter: Secondary | ICD-10-CM | POA: Diagnosis not present

## 2023-02-25 DIAGNOSIS — Z87442 Personal history of urinary calculi: Secondary | ICD-10-CM | POA: Diagnosis not present

## 2023-02-25 HISTORY — PX: BOTOX INJECTION: SHX5754

## 2023-02-25 LAB — CBC
HCT: 41 % (ref 39.0–52.0)
Hemoglobin: 14.1 g/dL (ref 13.0–17.0)
MCH: 29.4 pg (ref 26.0–34.0)
MCHC: 34.4 g/dL (ref 30.0–36.0)
MCV: 85.4 fL (ref 80.0–100.0)
Platelets: 196 10*3/uL (ref 150–400)
RBC: 4.8 MIL/uL (ref 4.22–5.81)
RDW: 13.4 % (ref 11.5–15.5)
WBC: 7.4 10*3/uL (ref 4.0–10.5)
nRBC: 0 % (ref 0.0–0.2)

## 2023-02-25 SURGERY — BOTOX INJECTION
Anesthesia: General

## 2023-02-25 MED ORDER — SODIUM CHLORIDE (PF) 0.9 % IJ SOLN
INTRAMUSCULAR | Status: DC | PRN
  Administered 2023-02-25: 30 mL via INTRAVENOUS

## 2023-02-25 MED ORDER — ONABOTULINUMTOXINA 100 UNITS IJ SOLR
INTRAMUSCULAR | Status: AC
  Filled 2023-02-25: qty 300

## 2023-02-25 MED ORDER — STERILE WATER FOR IRRIGATION IR SOLN
Status: DC | PRN
  Administered 2023-02-25: 3000 mL

## 2023-02-25 MED ORDER — PROPOFOL 10 MG/ML IV BOLUS
INTRAVENOUS | Status: DC | PRN
  Administered 2023-02-25: 150 mg via INTRAVENOUS

## 2023-02-25 MED ORDER — DEXAMETHASONE SODIUM PHOSPHATE 10 MG/ML IJ SOLN
INTRAMUSCULAR | Status: AC
  Filled 2023-02-25: qty 1

## 2023-02-25 MED ORDER — LACTATED RINGERS IV SOLN: INTRAVENOUS | Status: DC

## 2023-02-25 MED ORDER — MIDAZOLAM HCL 2 MG/2ML IJ SOLN
INTRAMUSCULAR | Status: DC | PRN
  Administered 2023-02-25: 2 mg via INTRAVENOUS

## 2023-02-25 MED ORDER — ONDANSETRON HCL 4 MG/2ML IJ SOLN
INTRAMUSCULAR | Status: DC | PRN
  Administered 2023-02-25: 4 mg via INTRAVENOUS

## 2023-02-25 MED ORDER — GENTAMICIN SULFATE 40 MG/ML IJ SOLN
360.0000 mg | INTRAVENOUS | Status: AC
  Administered 2023-02-25: 360 mg via INTRAVENOUS
  Filled 2023-02-25: qty 9

## 2023-02-25 MED ORDER — ONDANSETRON HCL 4 MG/2ML IJ SOLN
INTRAMUSCULAR | Status: AC
  Filled 2023-02-25: qty 2

## 2023-02-25 MED ORDER — ACETAMINOPHEN 500 MG PO TABS
1000.0000 mg | ORAL_TABLET | Freq: Once | ORAL | Status: AC
  Administered 2023-02-25: 1000 mg via ORAL
  Filled 2023-02-25: qty 2

## 2023-02-25 MED ORDER — DEXAMETHASONE SODIUM PHOSPHATE 10 MG/ML IJ SOLN
INTRAMUSCULAR | Status: DC | PRN
  Administered 2023-02-25: 10 mg via INTRAVENOUS

## 2023-02-25 MED ORDER — CHLORHEXIDINE GLUCONATE 0.12 % MT SOLN
15.0000 mL | Freq: Once | OROMUCOSAL | Status: AC
  Administered 2023-02-25: 15 mL via OROMUCOSAL

## 2023-02-25 MED ORDER — LIDOCAINE HCL (PF) 2 % IJ SOLN
INTRAMUSCULAR | Status: AC
Start: 2023-02-25 — End: ?
  Filled 2023-02-25: qty 5

## 2023-02-25 MED ORDER — SODIUM CHLORIDE (PF) 0.9 % IJ SOLN
INTRAMUSCULAR | Status: AC
  Filled 2023-02-25: qty 30

## 2023-02-25 MED ORDER — ORAL CARE MOUTH RINSE: 15.0000 mL | Freq: Once | OROMUCOSAL | Status: AC

## 2023-02-25 MED ORDER — MIDAZOLAM HCL 2 MG/2ML IJ SOLN
INTRAMUSCULAR | Status: AC
  Filled 2023-02-25: qty 2

## 2023-02-25 MED ORDER — ONABOTULINUMTOXINA 100 UNITS IJ SOLR
INTRAMUSCULAR | Status: DC | PRN
  Administered 2023-02-25: 300 [IU] via INTRAMUSCULAR

## 2023-02-25 SURGICAL SUPPLY — 16 items
BAG URO CATCHER STRL LF (MISCELLANEOUS) ×1 IMPLANT
CLOTH BEACON ORANGE TIMEOUT ST (SAFETY) ×1 IMPLANT
GLOVE SURG LX STRL 7.5 STRW (GLOVE) ×1 IMPLANT
GOWN STRL REUS W/ TWL XL LVL3 (GOWN DISPOSABLE) ×1 IMPLANT
GOWN STRL REUS W/TWL XL LVL3 (GOWN DISPOSABLE) ×1
KIT TURNOVER KIT A (KITS) IMPLANT
MANIFOLD NEPTUNE II (INSTRUMENTS) ×1 IMPLANT
NDL ASPIRATION 22 (NEEDLE) ×1 IMPLANT
NDL SAFETY ECLIPSE 18X1.5 (NEEDLE) IMPLANT
NEEDLE ASPIRATION 22 (NEEDLE) ×1
PACK CYSTO (CUSTOM PROCEDURE TRAY) ×1 IMPLANT
PAD PREP 24X48 CUFFED NSTRL (MISCELLANEOUS) ×1 IMPLANT
PENCIL SMOKE EVACUATOR (MISCELLANEOUS) IMPLANT
SYR CONTROL 10ML LL (SYRINGE) IMPLANT
TUBING CONNECTING 10 (TUBING) IMPLANT
WATER STERILE IRR 3000ML UROMA (IV SOLUTION) ×1 IMPLANT

## 2023-02-25 NOTE — Interval H&P Note (Signed)
History and Physical Interval Note:  02/25/2023 12:38 PM  Ryan Watkins  has presented today for surgery, with the diagnosis of NEUROGENIC BLADDER.  The various methods of treatment have been discussed with the patient and family. After consideration of risks, benefits and other options for treatment, the patient has consented to  Procedure(s) with comments: CYSTOSCOPY BOTOX INJECTION (N/A) - 45 MINS FOR CASE as a surgical intervention.  The patient's history has been reviewed, patient examined, no change in status, stable for surgery.  I have reviewed the patient's chart and labs.  Questions were answered to the patient's satisfaction.  His urine has been clear.  He has not had fever.  No cloudy urine.  He is beginning to get some autonomic dysreflexia symptoms consistent with the Botox wearing off.  We held off on p.o. antibiotics prior to today's procedure because he is growing some resistance.  He grew E. coli and Klebsiella on his preop urine culture and will get gentamicin today which will cover both.  He is not clinically infected.  We discussed we will irrigate the bladder thoroughly and keep the pressure low.   [name]

## 2023-02-25 NOTE — Anesthesia Preprocedure Evaluation (Addendum)
Anesthesia Evaluation  Patient identified by MRN, date of birth, ID band Patient awake    Reviewed: Allergy & Precautions, NPO status , Patient's Chart, lab work & pertinent test results  History of Anesthesia Complications Negative for: history of anesthetic complications  Airway Mallampati: II  TM Distance: >3 FB Neck ROM: Full    Dental  (+) Dental Advisory Given   Pulmonary  Trach 2005, decannulated after a month   breath sounds clear to auscultation       Cardiovascular negative cardio ROS  Rhythm:Regular Rate:Normal     Neuro/Psych C4 quadriplegia    GI/Hepatic negative GI ROS, Neg liver ROS,,,  Endo/Other  negative endocrine ROS    Renal/GU negative Renal ROS Bladder dysfunction (neurogenic bladder)      Musculoskeletal   Abdominal   Peds  Hematology negative hematology ROS (+)   Anesthesia Other Findings   Reproductive/Obstetrics                             Anesthesia Physical Anesthesia Plan  ASA: 3  Anesthesia Plan: General   Post-op Pain Management: Tylenol PO (pre-op)*   Induction: Intravenous  PONV Risk Score and Plan: 2 and Ondansetron and Dexamethasone  Airway Management Planned: LMA  Additional Equipment: None  Intra-op Plan:   Post-operative Plan:   Informed Consent: I have reviewed the patients History and Physical, chart, labs and discussed the procedure including the risks, benefits and alternatives for the proposed anesthesia with the patient or authorized representative who has indicated his/her understanding and acceptance.     Dental advisory given  Plan Discussed with: CRNA and Surgeon  Anesthesia Plan Comments:        Anesthesia Quick Evaluation

## 2023-02-25 NOTE — Anesthesia Procedure Notes (Signed)
Procedure Name: LMA Insertion Date/Time: 02/25/2023 1:09 PM  Performed by: Donna Bernard, CRNAPre-anesthesia Checklist: Patient identified, Emergency Drugs available, Suction available, Patient being monitored and Timeout performed Patient Re-evaluated:Patient Re-evaluated prior to induction Oxygen Delivery Method: Circle system utilized Preoxygenation: Pre-oxygenation with 100% oxygen Induction Type: IV induction Ventilation: Mask ventilation without difficulty LMA: LMA inserted LMA Size: 5.0 Airway Equipment and Method: Lighted stylet Tube secured with: Tape Dental Injury: Teeth and Oropharynx as per pre-operative assessment

## 2023-02-25 NOTE — Anesthesia Postprocedure Evaluation (Signed)
Anesthesia Post Note  Patient: Ryan Watkins  Procedure(s) Performed: CYSTOSCOPY BOTOX INJECTION     Patient location during evaluation: PACU Anesthesia Type: General Level of consciousness: awake and alert, patient cooperative and oriented Pain management: pain level controlled Vital Signs Assessment: post-procedure vital signs reviewed and stable Respiratory status: nonlabored ventilation, spontaneous breathing and respiratory function stable Cardiovascular status: stable and blood pressure returned to baseline Postop Assessment: no apparent nausea or vomiting Anesthetic complications: no   No notable events documented.  Last Vitals:  Vitals:   02/25/23 1400 02/25/23 1415  BP: 102/82 90/68  Pulse: 70 (!) 56  Resp: 16 12  Temp:    SpO2: 95% 99%    Last Pain:  Vitals:   02/25/23 1415  TempSrc:   PainSc: 0-No pain                 Reather Steller,E. Ginelle Bays

## 2023-02-25 NOTE — Transfer of Care (Signed)
Immediate Anesthesia Transfer of Care Note  Patient: Ryan Watkins  Procedure(s) Performed: CYSTOSCOPY BOTOX INJECTION  Patient Location: PACU  Anesthesia Type:General  Level of Consciousness: awake, alert , oriented, and patient cooperative  Airway & Oxygen Therapy: Patient Spontanous Breathing and Patient connected to face mask oxygen  Post-op Assessment: Report given to RN and Post -op Vital signs reviewed and stable  Post vital signs: stable  Last Vitals:  Vitals Value Taken Time  BP 120/91 02/25/23 1345  Temp    Pulse    Resp    SpO2    Vitals shown include unfiled device data.  Last Pain:  Vitals:   02/25/23 1111  TempSrc: Oral  PainSc:          Complications: No notable events documented.

## 2023-02-25 NOTE — Progress Notes (Signed)
A consult was received from Urologist prior to cystoscopy botox injection surgery for gentamicin per pharmacy dosing for surgical prophylaxis.  The patient's profile has been reviewed for ht/wt/allergies/indication/available labs.    A one time order has been placed for gentamicin 360 mg IV.    Further antibiotics/pharmacy consults should be ordered by attending provider if indicated.                       Thank you, Lynden Ang PharmD, BCPS 02/25/2023  11:17 AM

## 2023-02-25 NOTE — Op Note (Signed)
Preoperative diagnosis: Neurogenic bladder Postop diagnosis: Same   Procedure: Cystoscopy with Bladder Botox injection   Surgeon: Mena Goes   Anesthesia: General   Indication for procedure: Ryan Watkins is a 37 year old male quadriplegic with neurogenic bladder.  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   Findings: Urethra and bladder unremarkable.  Typical tight bulbar prostatic urethra but the scope passed easily.  No stone or foreign body in the bladder.   Description of procedure: After consent was obtained patient brought to the operating room.  After adequate anesthesia he was placed in lithotomy position and prepped and draped in the usual sterile fashion.  Timeout was performed to confirm the patient and procedure.  The cystoscope with the Botox sheath and needle were passed per urethra.  The bladder was irrigated several times.  The bladder was filled to about 200 cc and 300 units of Botox was injected with 30 one-mL injections at 10 units/mL.  Good coverage of the bladder.  Hemostasis was excellent at low pressure. Bladder was filled again and the scope removed.  A 16 French Foley catheter was placed and left to gravity drainage.  He was awakened taken recovery room in stable condition.   Complications: None   Blood loss: Minimal   Specimens: None   Drains: 16 French Foley catheter   Disposition: Patient stable to PACU

## 2023-02-26 ENCOUNTER — Encounter (HOSPITAL_COMMUNITY): Payer: Self-pay | Admitting: Urology

## 2023-04-24 ENCOUNTER — Other Ambulatory Visit: Payer: Self-pay | Admitting: Urology

## 2023-04-24 DIAGNOSIS — N281 Cyst of kidney, acquired: Secondary | ICD-10-CM

## 2023-04-24 DIAGNOSIS — N200 Calculus of kidney: Secondary | ICD-10-CM

## 2023-06-16 ENCOUNTER — Ambulatory Visit (INDEPENDENT_AMBULATORY_CARE_PROVIDER_SITE_OTHER): Payer: Medicare Other

## 2023-06-16 ENCOUNTER — Other Ambulatory Visit: Payer: BC Managed Care – PPO

## 2023-06-16 DIAGNOSIS — N200 Calculus of kidney: Secondary | ICD-10-CM

## 2023-06-16 DIAGNOSIS — N281 Cyst of kidney, acquired: Secondary | ICD-10-CM | POA: Diagnosis not present

## 2023-06-26 NOTE — Progress Notes (Signed)
 Sent message, via epic in basket, requesting orders in epic from Careers adviser.

## 2023-06-27 NOTE — Progress Notes (Addendum)
 Anesthesia Review:  PCP: Kip Corrington  Cardiologist : none  Chest x-ray : EKG : Echo : Stress test: Cardiac Cath :  Activity level:  Sleep Study/ CPAP : Fasting Blood Sugar :      / Checks Blood Sugar -- times a day:   Blood Thinner/ Instructions /Last Dose: ASA / Instructions/ Last Dose :    02/25/23- cysto  QUAD  Telephone appt on 06/30/23 at 3pm  Med hx and preop instructions completed via phone call on 06/30/23.  Spoke with pt .  At end of phone call pt instructed to call Admitting at [phone].  PT voiced understanding.     No orders in epic at time of preop appt.  Called and LVMM for Rhonda at office.

## 2023-06-27 NOTE — Patient Instructions (Signed)
SURGICAL WAITING ROOM VISITATION  Patients having surgery or a procedure may have no more than 2 support people in the waiting area - these visitors may rotate.    Children under the age of 33 must have an adult with them who is not the patient.  Due to an increase in RSV and influenza rates and associated hospitalizations, children ages 63 and under may not visit patients in [facility] hospitals.  Visitors with respiratory illnesses are discouraged from visiting and should remain at home.  If the patient needs to stay at the hospital during part of their recovery, the visitor guidelines for inpatient rooms apply. Pre-op nurse will coordinate an appropriate time for 1 support person to accompany patient in pre-op.  This support person may not rotate.    Please refer to the [facility] website for the visitor guidelines for Inpatients (after your surgery is over and you are in a regular room).       Your procedure is scheduled on:  07/11/2023    Report to [facility] Main Entrance    Report to admitting at    0930AM   Call this number if you have problems the morning of surgery [phone]   Do not eat food  or drink liquids :After Midnight.                If you have questions, please contact your surgeon's office.       Oral Hygiene is also important to reduce your risk of infection.                                    Remember - BRUSH YOUR TEETH THE MORNING OF SURGERY WITH YOUR REGULAR TOOTHPASTE  DENTURES WILL BE REMOVED PRIOR TO SURGERY PLEASE DO NOT APPLY "Poly grip" OR ADHESIVES!!!   Do NOT smoke after Midnight   Stop all vitamins and herbal supplements 7 days before surgery.   Take these medicines the morning of surgery with A SIP OF WATER:  none   DO NOT TAKE ANY ORAL DIABETIC MEDICATIONS DAY OF YOUR SURGERY  Bring CPAP mask and tubing day of surgery.                              You may not have any metal on your body including hair pins, jewelry,  and body piercing             Do not wear make-up, lotions, powders, perfumes/cologne, or deodorant  Do not wear nail polish including gel and S&S, artificial/acrylic nails, or any other type of covering on natural nails including finger and toenails. If you have artificial nails, gel coating, etc. that needs to be removed by a nail salon please have this removed prior to surgery or surgery may need to be canceled/ delayed if the surgeon/ anesthesia feels like they are unable to be safely monitored.   Do not shave  48 hours prior to surgery.               Men may shave face and neck.   Do not bring valuables to the hospital.  IS NOT             RESPONSIBLE   FOR VALUABLES.   Contacts, glasses, dentures or bridgework may not be worn into surgery.   Bring small overnight bag day  of surgery.   DO NOT BRING YOUR HOME MEDICATIONS TO THE HOSPITAL. PHARMACY WILL DISPENSE MEDICATIONS LISTED ON YOUR MEDICATION LIST TO YOU DURING YOUR ADMISSION IN THE HOSPITAL!    Patients discharged on the day of surgery will not be allowed to drive home.  Someone NEEDS to stay with you for the first 24 hours after anesthesia.   Special Instructions: Bring a copy of your healthcare power of attorney and living will documents the day of surgery if you haven't scanned them before.              Please read over the following fact sheets you were given: IF YOU HAVE QUESTIONS ABOUT YOUR PRE-OP INSTRUCTIONS PLEASE CALL [phone]   If you received a COVID test during your pre-op visit  it is requested that you wear a mask when out in public, stay away from anyone that may not be feeling well and notify your surgeon if you develop symptoms. If you test positive for Covid or have been in contact with anyone that has tested positive in the last 10 days please notify you surgeon.    [facility] - Preparing for Surgery Before surgery, you can play an important role.  Because skin is not sterile, your skin  needs to be as free of germs as possible.  You can reduce the number of germs on your skin by washing with CHG (chlorahexidine gluconate) soap before surgery.  CHG is an antiseptic cleaner which kills germs and bonds with the skin to continue killing germs even after washing. Please DO NOT use if you have an allergy to CHG or antibacterial soaps.  If your skin becomes reddened/irritated stop using the CHG and inform your nurse when you arrive at Short Stay. Do not shave (including legs and underarms) for at least 48 hours prior to the first CHG shower.  You may shave your face/neck. Please follow these instructions carefully:  1.  Shower with CHG Soap the night before surgery and the  morning of Surgery.  2.  If you choose to wash your hair, wash your hair first as usual with your  normal  shampoo.  3.  After you shampoo, rinse your hair and body thoroughly to remove the  shampoo.                           4.  Use CHG as you would any other liquid soap.  You can apply chg directly  to the skin and wash                       Gently with a scrungie or clean washcloth.  5.  Apply the CHG Soap to your body ONLY FROM THE NECK DOWN.   Do not use on face/ open                           Wound or open sores. Avoid contact with eyes, ears mouth and genitals (private parts).                       Wash face,  Genitals (private parts) with your normal soap.             6.  Wash thoroughly, paying special attention to the area where your surgery  will be performed.  7.  Thoroughly rinse your body with warm water from the  neck down.  8.  DO NOT shower/wash with your normal soap after using and rinsing off  the CHG Soap.                9.  Pat yourself dry with a clean towel.            10.  Wear clean pajamas.            11.  Place clean sheets on your bed the night of your first shower and do not  sleep with pets. Day of Surgery : Do not apply any lotions/deodorants the morning of surgery.  Please wear clean  clothes to the hospital/surgery center.  FAILURE TO FOLLOW THESE INSTRUCTIONS MAY RESULT IN THE CANCELLATION OF YOUR SURGERY PATIENT SIGNATURE_________________________________  NURSE SIGNATURE__________________________________  ________________________________________________________________________

## 2023-06-30 ENCOUNTER — Encounter (HOSPITAL_COMMUNITY)
Admission: RE | Admit: 2023-06-30 | Discharge: 2023-06-30 | Disposition: A | Discharge status: Home / Self Care | Payer: Medicare Other | Source: Ambulatory Visit | Attending: Urology | Admitting: Urology

## 2023-06-30 ENCOUNTER — Encounter (HOSPITAL_COMMUNITY): Payer: Self-pay

## 2023-06-30 ENCOUNTER — Other Ambulatory Visit: Payer: Self-pay

## 2023-06-30 DIAGNOSIS — Z01818 Encounter for other preprocedural examination: Secondary | ICD-10-CM

## 2023-06-30 DIAGNOSIS — Z01812 Encounter for preprocedural laboratory examination: Secondary | ICD-10-CM | POA: Diagnosis present

## 2023-06-30 DIAGNOSIS — G8220 Paraplegia, unspecified: Secondary | ICD-10-CM | POA: Diagnosis not present

## 2023-07-01 ENCOUNTER — Other Ambulatory Visit: Payer: Self-pay | Admitting: Urology

## 2023-07-10 NOTE — Anesthesia Preprocedure Evaluation (Addendum)
 Anesthesia Evaluation  Patient identified by MRN, date of birth, ID band Patient awake    Reviewed: Allergy & Precautions, NPO status , Patient's Chart, lab work & pertinent test results  History of Anesthesia Complications Negative for: history of anesthetic complications  Airway Mallampati: II  TM Distance: >3 FB Neck ROM: Full    Dental no notable dental hx. (+) Dental Advisory Given, Teeth Intact   Pulmonary  Trach 2005, decannulated after a month   Pulmonary exam normal breath sounds clear to auscultation       Cardiovascular (-) hypertension(-) angina (-) Past MI negative cardio ROS Normal cardiovascular exam Rhythm:Regular Rate:Normal     Neuro/Psych C4 quadriplegia    GI/Hepatic negative GI ROS, Neg liver ROS,,,  Endo/Other  negative endocrine ROS    Renal/GU Renal diseasenegative Renal ROS Bladder dysfunction (neurogenic bladder)      Musculoskeletal   Abdominal   Peds  Hematology negative hematology ROS (+)   Anesthesia Other Findings   Reproductive/Obstetrics                             Anesthesia Physical Anesthesia Plan  ASA: 3  Anesthesia Plan: General   Post-op Pain Management: Tylenol PO (pre-op)*   Induction: Intravenous  PONV Risk Score and Plan: 2 and Ondansetron and Dexamethasone  Airway Management Planned: LMA  Additional Equipment: None  Intra-op Plan:   Post-operative Plan:   Informed Consent: I have reviewed the patients History and Physical, chart, labs and discussed the procedure including the risks, benefits and alternatives for the proposed anesthesia with the patient or authorized representative who has indicated his/her understanding and acceptance.     Dental advisory given  Plan Discussed with: CRNA and Surgeon  Anesthesia Plan Comments:        Anesthesia Quick Evaluation

## 2023-07-11 ENCOUNTER — Ambulatory Visit (HOSPITAL_COMMUNITY)
Admission: RE | Admit: 2023-07-11 | Discharge: 2023-07-11 | Disposition: A | Discharge status: Home / Self Care | Payer: Medicare Other | Source: Ambulatory Visit | Attending: Urology | Admitting: Urology

## 2023-07-11 ENCOUNTER — Encounter (HOSPITAL_COMMUNITY)
Admission: RE | Disposition: A | Discharge status: Home / Self Care | Payer: Self-pay | Source: Ambulatory Visit | Attending: Urology

## 2023-07-11 ENCOUNTER — Ambulatory Visit (HOSPITAL_COMMUNITY): Payer: Self-pay | Admitting: Anesthesiology

## 2023-07-11 ENCOUNTER — Encounter (HOSPITAL_COMMUNITY): Payer: Self-pay | Admitting: Urology

## 2023-07-11 ENCOUNTER — Ambulatory Visit (HOSPITAL_BASED_OUTPATIENT_CLINIC_OR_DEPARTMENT_OTHER): Payer: Self-pay | Admitting: Anesthesiology

## 2023-07-11 DIAGNOSIS — N319 Neuromuscular dysfunction of bladder, unspecified: Secondary | ICD-10-CM | POA: Diagnosis not present

## 2023-07-11 DIAGNOSIS — Z87442 Personal history of urinary calculi: Secondary | ICD-10-CM | POA: Diagnosis not present

## 2023-07-11 DIAGNOSIS — Z79899 Other long term (current) drug therapy: Secondary | ICD-10-CM | POA: Insufficient documentation

## 2023-07-11 DIAGNOSIS — Z87440 Personal history of urinary (tract) infections: Secondary | ICD-10-CM | POA: Insufficient documentation

## 2023-07-11 DIAGNOSIS — G904 Autonomic dysreflexia: Secondary | ICD-10-CM | POA: Insufficient documentation

## 2023-07-11 DIAGNOSIS — Z01818 Encounter for other preprocedural examination: Secondary | ICD-10-CM

## 2023-07-11 DIAGNOSIS — G8250 Quadriplegia, unspecified: Secondary | ICD-10-CM | POA: Diagnosis not present

## 2023-07-11 HISTORY — PX: BOTOX INJECTION: SHX5754

## 2023-07-11 HISTORY — PX: CYSTOSCOPY: SHX5120

## 2023-07-11 LAB — BASIC METABOLIC PANEL
Anion gap: 10 (ref 5–15)
BUN: 18 mg/dL (ref 6–20)
CO2: 21 mmol/L — ABNORMAL LOW (ref 22–32)
Calcium: 8.9 mg/dL (ref 8.9–10.3)
Chloride: 107 mmol/L (ref 98–111)
Creatinine, Ser: 0.6 mg/dL — ABNORMAL LOW (ref 0.61–1.24)
GFR, Estimated: >60 mL/min (ref >60)
Glucose, Bld: 107 mg/dL — ABNORMAL HIGH (ref 70–99)
Potassium: 4.2 mmol/L (ref 3.5–5.1)
Sodium: 138 mmol/L (ref 135–145)

## 2023-07-11 LAB — CBC
HCT: 45.3 % (ref 39.0–52.0)
Hemoglobin: 15.1 g/dL (ref 13.0–17.0)
MCH: 28.8 pg (ref 26.0–34.0)
MCHC: 33.3 g/dL (ref 30.0–36.0)
MCV: 86.3 fL (ref 80.0–100.0)
Platelets: 195 10*3/uL (ref 150–400)
RBC: 5.25 MIL/uL (ref 4.22–5.81)
RDW: 13.4 % (ref 11.5–15.5)
WBC: 6.7 10*3/uL (ref 4.0–10.5)
nRBC: 0 % (ref 0.0–0.2)

## 2023-07-11 SURGERY — CYSTOSCOPY
Anesthesia: General

## 2023-07-11 MED ORDER — ACETAMINOPHEN 10 MG/ML IV SOLN: 1000.0000 mg | Freq: Once | INTRAVENOUS | Status: DC | PRN

## 2023-07-11 MED ORDER — PROPOFOL 10 MG/ML IV BOLUS
INTRAVENOUS | Status: AC
  Filled 2023-07-11: qty 20

## 2023-07-11 MED ORDER — LABETALOL HCL 5 MG/ML IV SOLN
INTRAVENOUS | Status: DC | PRN
Start: 2023-07-11 — End: 2023-07-11
  Administered 2023-07-11: 2.5 mg via INTRAVENOUS

## 2023-07-11 MED ORDER — FENTANYL CITRATE (PF) 100 MCG/2ML IJ SOLN
INTRAMUSCULAR | Status: DC | PRN
  Administered 2023-07-11: 50 ug via INTRAVENOUS
  Administered 2023-07-11: 25 ug via INTRAVENOUS

## 2023-07-11 MED ORDER — STERILE WATER FOR IRRIGATION IR SOLN
Status: DC | PRN
  Administered 2023-07-11: 3000 mL

## 2023-07-11 MED ORDER — MIDAZOLAM HCL 5 MG/5ML IJ SOLN
INTRAMUSCULAR | Status: DC | PRN
  Administered 2023-07-11 (×2): 1 mg via INTRAVENOUS

## 2023-07-11 MED ORDER — SODIUM CHLORIDE (PF) 0.9 % IJ SOLN
INTRAMUSCULAR | Status: AC
  Filled 2023-07-11: qty 30

## 2023-07-11 MED ORDER — MIDAZOLAM HCL 2 MG/2ML IJ SOLN
INTRAMUSCULAR | Status: AC
  Filled 2023-07-11: qty 2

## 2023-07-11 MED ORDER — ONDANSETRON HCL 4 MG/2ML IJ SOLN
INTRAMUSCULAR | Status: AC
  Filled 2023-07-11: qty 2

## 2023-07-11 MED ORDER — AMISULPRIDE (ANTIEMETIC) 5 MG/2ML IV SOLN: 10.0000 mg | Freq: Once | INTRAVENOUS | Status: DC | PRN

## 2023-07-11 MED ORDER — HYDROMORPHONE HCL 1 MG/ML IJ SOLN: 0.2500 mg | INTRAMUSCULAR | Status: DC | PRN

## 2023-07-11 MED ORDER — DEXAMETHASONE SODIUM PHOSPHATE 10 MG/ML IJ SOLN
INTRAMUSCULAR | Status: AC
  Filled 2023-07-11: qty 1

## 2023-07-11 MED ORDER — CEFAZOLIN SODIUM-DEXTROSE 2-4 GM/100ML-% IV SOLN
2.0000 g | INTRAVENOUS | Status: AC
  Administered 2023-07-11: 2 g via INTRAVENOUS
  Filled 2023-07-11: qty 100

## 2023-07-11 MED ORDER — LACTATED RINGERS IV SOLN: INTRAVENOUS | Status: DC

## 2023-07-11 MED ORDER — GENTAMICIN SULFATE 40 MG/ML IJ SOLN
5.0000 mg/kg | INTRAVENOUS | Status: AC
  Administered 2023-07-11: 360 mg via INTRAVENOUS
  Filled 2023-07-11: qty 9

## 2023-07-11 MED ORDER — DEXAMETHASONE SODIUM PHOSPHATE 10 MG/ML IJ SOLN
INTRAMUSCULAR | Status: DC | PRN
  Administered 2023-07-11: 8 mg via INTRAVENOUS

## 2023-07-11 MED ORDER — ONABOTULINUMTOXINA 100 UNITS IJ SOLR
INTRAMUSCULAR | Status: AC
  Filled 2023-07-11: qty 300

## 2023-07-11 MED ORDER — OXYCODONE HCL 5 MG PO TABS: 5.0000 mg | ORAL_TABLET | Freq: Once | ORAL | Status: DC | PRN

## 2023-07-11 MED ORDER — LIDOCAINE HCL (PF) 2 % IJ SOLN
INTRAMUSCULAR | Status: AC
  Filled 2023-07-11: qty 5

## 2023-07-11 MED ORDER — ONDANSETRON HCL 4 MG/2ML IJ SOLN
INTRAMUSCULAR | Status: DC | PRN
  Administered 2023-07-11: 4 mg via INTRAVENOUS

## 2023-07-11 MED ORDER — OXYCODONE HCL 5 MG/5ML PO SOLN: 5.0000 mg | Freq: Once | ORAL | Status: DC | PRN

## 2023-07-11 MED ORDER — ONDANSETRON HCL 4 MG/2ML IJ SOLN: 4.0000 mg | Freq: Once | INTRAMUSCULAR | Status: DC | PRN

## 2023-07-11 MED ORDER — CHLORHEXIDINE GLUCONATE 0.12 % MT SOLN
15.0000 mL | Freq: Once | OROMUCOSAL | Status: AC
  Administered 2023-07-11: 15 mL via OROMUCOSAL

## 2023-07-11 MED ORDER — SODIUM CHLORIDE (PF) 0.9 % IJ SOLN
INTRAMUSCULAR | Status: DC | PRN
  Administered 2023-07-11: 30 mL

## 2023-07-11 MED ORDER — ORAL CARE MOUTH RINSE: 15.0000 mL | Freq: Once | OROMUCOSAL | Status: AC

## 2023-07-11 MED ORDER — FENTANYL CITRATE (PF) 100 MCG/2ML IJ SOLN
INTRAMUSCULAR | Status: AC
  Filled 2023-07-11: qty 2

## 2023-07-11 MED ORDER — LIDOCAINE HCL (CARDIAC) PF 100 MG/5ML IV SOSY
PREFILLED_SYRINGE | INTRAVENOUS | Status: DC | PRN
  Administered 2023-07-11: 60 mg via INTRAVENOUS

## 2023-07-11 MED ORDER — PROPOFOL 10 MG/ML IV BOLUS
INTRAVENOUS | Status: DC | PRN
  Administered 2023-07-11: 30 mg via INTRAVENOUS

## 2023-07-11 SURGICAL SUPPLY — 18 items
BAG URO CATCHER STRL LF (MISCELLANEOUS) ×1 IMPLANT
BASKET ZERO TIP NITINOL 2.4FR (BASKET) IMPLANT
CATH URETL OPEN END 6FR 70 (CATHETERS) IMPLANT
CLOTH BEACON ORANGE TIMEOUT ST (SAFETY) ×1 IMPLANT
GLOVE SURG LX STRL 7.5 STRW (GLOVE) ×1 IMPLANT
GOWN STRL REUS W/ TWL XL LVL3 (GOWN DISPOSABLE) ×1 IMPLANT
GUIDEWIRE ANG ZIPWIRE 038X150 (WIRE) IMPLANT
GUIDEWIRE STR DUAL SENSOR (WIRE) ×1 IMPLANT
KIT TURNOVER KIT A (KITS) IMPLANT
MANIFOLD NEPTUNE II (INSTRUMENTS) ×1 IMPLANT
NDL ASPIRATION 22 (NEEDLE) ×1 IMPLANT
NDL SAFETY ECLIPSE 18X1.5 (NEEDLE) IMPLANT
NEEDLE ASPIRATION 22 (NEEDLE) ×1 IMPLANT
PACK CYSTO (CUSTOM PROCEDURE TRAY) ×1 IMPLANT
PENCIL SMOKE EVACUATOR (MISCELLANEOUS) IMPLANT
SYR CONTROL 10ML LL (SYRINGE) IMPLANT
TUBING CONNECTING 10 (TUBING) ×1 IMPLANT
WATER STERILE IRR 3000ML UROMA (IV SOLUTION) ×1 IMPLANT

## 2023-07-11 NOTE — Anesthesia Procedure Notes (Signed)
 Procedure Name: LMA Insertion Date/Time: 07/11/2023 12:02 PM  Performed by: Garth Bigness, CRNAPre-anesthesia Checklist: Patient identified, Emergency Drugs available, Suction available and Patient being monitored Patient Re-evaluated:Patient Re-evaluated prior to induction Oxygen Delivery Method: Circle system utilized Preoxygenation: Pre-oxygenation with 100% oxygen Induction Type: IV induction Ventilation: Mask ventilation without difficulty LMA: LMA with gastric port inserted LMA Size: 4.0 Tube type: Oral Number of attempts: 1 Placement Confirmation: positive ETCO2 and breath sounds checked- equal and bilateral Tube secured with: Tape Dental Injury: Teeth and Oropharynx as per pre-operative assessment

## 2023-07-11 NOTE — H&P (Signed)
 H&P  Chief Complaint: Neurogenic bladder  History of Present Illness: Ryan Watkins is a 38 year old male paraplegic with neurogenic bladder.  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  He is managed with intravesical Botox injection.  He has been well without any bladder pain or fever.  He also has a history of kidney stones and underwent a CT scan abdomen and pelvis which revealed through 4 small stones in the right lower pole the largest was about 5 mm and a 6 mm left lower pole stone.  They were difficult to see on the scout image.  Past Medical History:  Diagnosis Date   Anemia    History of kidney stones    Neurogenic bladder    caths 4 x day   Palpitations    Pneumonia 2009   Quadriplegia (HCC) 08/2003   C 4   Sinus infection few weeks ago, off all antibiotics   lungs clear no cough   Thrombocytopenia (HCC)    UTI (urinary tract infection)    completed Cipro 07/18/11   Past Surgical History:  Procedure Laterality Date   bladder surgery to remove kidney stones  2006   BOTOX INJECTION N/A 06/29/2013   Procedure: BOTOX INJECTION;  Surgeon: [name], MD;  Location: [facility];  Service: Urology;  Laterality: N/A;   BOTOX INJECTION N/A 09/10/2016   Procedure: BOTOX INJECTION;  Surgeon: [name], MD;  Location: [facility];  Service: Urology;  Laterality: N/A;   BOTOX INJECTION N/A 04/24/2018   Procedure: BOTOX INJECTION WITH CYSTOSCOPY;  Surgeon: [name], MD;  Location: [facility];  Service: Urology;  Laterality: N/A;   BOTOX INJECTION N/A 01/29/2019   Procedure: BOTOX INJECTION WITH CYSTOSCOPY;  Surgeon: [name], MD;  [facility]: [facility];  Service: Urology;  Laterality: N/A;  ONLY NEEDS 90 MIN FOR ALL PROCEDURES   BOTOX INJECTION N/A 09/24/2019   Procedure: BOTOX INJECTION INTO BLADDER WITH CYSTOSCOPY;  Surgeon: [name], MD;  [facility]: [facility];  Service: Urology;  Laterality: N/A;  ONLY NEEDS 60 MIN   BOTOX  INJECTION N/A 01/28/2020   Procedure: CYSTOSCOPY WITH BOTOX INJECTION 300 UNITS, CYSTOLYTHOPXY;  Surgeon: [name], MD;  [facility]: [facility];  Service: Urology;  Laterality: N/A;   BOTOX INJECTION N/A 08/15/2020   Procedure: BOTOX INJECTION WITH CYSTOSCOPY;  Surgeon: [name], MD;  [facility]: [facility];  Service: Urology;  Laterality: N/A;   BOTOX INJECTION N/A 05/04/2021   Procedure: BOTOX INJECTION;  Surgeon: [name], MD;  [facility];  Service: Urology;  Laterality: N/A;   BOTOX INJECTION N/A 10/23/2021   Procedure: BOTOX INJECTION WITH CYSTOSCOPY, RIGHT RETROGRADE PYELOGRAM, RIGHT URETAL STENT PLACEMENT;  Surgeon: [name], MD;  [facility];  Service: Urology;  Laterality: N/A;   BOTOX INJECTION N/A 05/17/2022   Procedure: CYSTOSCOPY BOTOX INJECTION;  Surgeon: [name], MD;  [facility];  Service: Urology;  Laterality: N/A;  30 MINS FOR CASE   BOTOX INJECTION N/A 02/25/2023   Procedure: CYSTOSCOPY BOTOX INJECTION;  Surgeon: [name], MD;  [facility];  Service: Urology;  Laterality: N/A;  45 MINS FOR CASE   cervical spinal surgery C 4, C5 and C6  08-2003   CYSTOSCOPY  02/18/2012   Procedure: CYSTOSCOPY;  Surgeon: [name], MD;  [facility];  Service: Urology;  Laterality: N/A;   CYSTOSCOPY N/A 08/07/2012   Procedure: CYSTOSCOPY WITH BOTOX INJECTION ;  Surgeon: [name], MD;  [facility];  Service: Urology;  Laterality: N/A;   CYSTOSCOPY N/A 09/10/2016   Procedure: CYSTOSCOPY WITH BOTOX INJECTION;  Surgeon: [name], MD;  [facility];  Service: Urology;  Laterality: N/A;   CYSTOSCOPY WITH INJECTION  07/23/2011   Procedure: CYSTOSCOPY WITH INJECTION;  Surgeon: [name], MD;  [facility];  Service: Urology;  Laterality: N/A;   CYSTOSCOPY WITH INJECTION N/A 03/29/2014   Procedure: CYSTOSCOPY WITH BOTOX INJECTION;  Surgeon: [name], MD;  [facility];   Service: Urology;  Laterality: N/A;   CYSTOSCOPY WITH INJECTION N/A 12/13/2014   Procedure: CYSTOSCOPY WITH BOTOX;  Surgeon: [name], MD;  [facility];  Service: Urology;  Laterality: N/A;   CYSTOSCOPY WITH INJECTION N/A 06/16/2015   Procedure: CYSTOSCOPY WITH BOTOX INJECTION;  Surgeon: [name], MD;  [facility];  Service: Urology;  Laterality: N/A;   CYSTOSCOPY WITH INJECTION N/A 06/10/2017   Procedure: CYSTOSCOPY WITH INJECTION/ BOTOX;  Surgeon: [name], MD;  [facility];  Service: Urology;  Laterality: N/A;   CYSTOSCOPY WITH RETROGRADE PYELOGRAM, URETEROSCOPY AND STENT PLACEMENT Right 07/13/2013   Procedure: CYSTOSCOPY RIGHT URETEROSCOPY/LASER LITHROSCOPY [name] BASKET EXTRACTION STENT PLACEMENT;  Surgeon: [name], MD;  [facility];  Service: Urology;  Laterality: Right;   CYSTOSCOPY WITH URETEROSCOPY AND STENT PLACEMENT Right 06/29/2013   Procedure: CYSTOSCOPY WITH RETROGRADE, URETEROSCOPY AND STENT PLACEMENT ON RIGHT;  Surgeon: [name], MD;  [facility];  Service: Urology;  Laterality: Right;   CYSTOSCOPY/URETEROSCOPY/HOLMIUM LASER/STENT PLACEMENT Right 06/10/2017   Procedure: CYSTOSCOPY/RETROGRADE/URETEROSCOPY/STENT PLACEMENT;  Surgeon: [name], MD;  [facility];  Service: Urology;  Laterality: Right;   CYSTOSCOPY/URETEROSCOPY/HOLMIUM LASER/STENT PLACEMENT Right 07/01/2017   Procedure: CYSTOSCOPY/URETEROSCOPY/HOLMIUM LASER/STENT PLACEMENT;  Surgeon: [name], MD;  [facility];  Service: Urology;  Laterality: Right;   CYSTOSCOPY/URETEROSCOPY/HOLMIUM LASER/STENT PLACEMENT Right 08/15/2020   Procedure: RETROGRADE;  Surgeon: [name], MD;  [facility];  Service: Urology;  Laterality: Right;   CYSTOSCOPY/URETEROSCOPY/HOLMIUM LASER/STENT PLACEMENT Right 05/04/2021   Procedure: CYSTOSCOPY/LITHOLAPAXY/ FOR A 1CM STONE/RETROGRADE;  Surgeon: [name], MD;  [facility];   Service: Urology;  Laterality: Right;   CYSTOSCOPY/URETEROSCOPY/HOLMIUM LASER/STENT PLACEMENT Right 11/16/2021   Procedure: CYSTOSCOPY/ RIGHT URETEROSCOPY/HOLMIUM LASER/RETROGRADE/STENT EXCHANGE;  Surgeon: [name], MD;  [facility];  Service: Urology;  Laterality: Right;   duodenal surgery  feb 2005   HOLMIUM LASER APPLICATION Right 07/13/2013   Procedure: RIGHT LASER LITHOTRIPSY;  Surgeon: [name], MD;  [facility];  Service: Urology;  Laterality: Right;   sacral skin flap surgery  june 2005   on sacrum   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   done in [place]   URETEROSCOPY WITH HOLMIUM LASER LITHOTRIPSY Right 01/29/2019   Procedure: CYSTOSCOPY, URETEROSCOPY, BILATERAL RETR0GRADE PYLEOGRAM, AND BILATERAL STENT PLACEMENT;  Surgeon: [name], MD;  [facility];  Service: Urology;  Laterality: Right;   URETEROSCOPY WITH HOLMIUM LASER LITHOTRIPSY Bilateral 03/02/2019   Procedure: CYSTOSCOPY/URETEROSCOPY WITH HOLMIUM LASER LITHOTRIPSY/ RETROGRADE PYELOGRAM/ STENT EXCHANGE;  Surgeon: [name], MD;  [facility];  Service: Urology;  Laterality: Bilateral;   vena cana filter placement  08-2003   right groin   WISDOM TOOTH EXTRACTION  2009    Home Medications:  Medications Prior to Admission  Medication Sig Dispense Refill Last Dose/Taking   fluticasone (FLONASE) 50 MCG/ACT nasal spray Place 1 spray into both nostrils daily as needed for allergies.   Taking As Needed   ibuprofen (ADVIL,MOTRIN) 200  MG tablet Take 400 mg by mouth every 8 (eight) hours as needed (pain.).   Taking As Needed   oxybutynin (DITROPAN) 5 MG tablet Take 5 mg by mouth at bedtime.   Taking   senna (SENOKOT) 8.6 MG tablet Take 2 tablets by mouth daily as needed for constipation.   Taking As Needed   sildenafil (REVATIO) 20 MG tablet Take 20-100 mg by mouth daily as needed (erectile dysfunction).   Taking As Needed   Allergies:  Allergies  Allergen  Reactions   Morphine And Codeine Other (See Comments)    hallucinations    No family history on file. Social History:  reports that he has never smoked. He has never used smokeless tobacco. He reports current alcohol use. He reports that he does not use drugs.  ROS: A complete review of systems was performed.  All systems are negative except for pertinent findings as noted. Review of Systems  All other systems reviewed and are negative.    Physical Exam:  Vital signs in last 24 hours: Temp:  [98 F (36.7 C)] 98 F (36.7 C) (03/07 1005) Pulse Rate:  [62] 62 (03/07 1005) Resp:  [18] 18 (03/07 1005) BP: (156)/(116) 156/116 (03/07 1005) SpO2:  [98 %] 98 % (03/07 1005) General:  Alert and oriented, No acute distress HEENT: Normocephalic, atraumatic Cardiovascular: Regular rate and rhythm Lungs: Regular rate and effort Abdomen: Soft, nontender, nondistended, no abdominal masses Back: No CVA tenderness Extremities: No edema, some lower extremity spasms Neurologic: Grossly intact  Laboratory Data:  No results found for this or any previous visit (from the past 24 hours). No results found for this or any previous visit (from the past 240 hours). Creatinine: No results for input(s): "CREATININE" in the last 168 hours.  Impression/Assessment:  Neurogenic bladder, kidney stones-  Plan:  I discussed with the patient the nature, potential benefits, risks and alternatives to cystoscopy with Botox injection, including side effects of the proposed treatment, the likelihood of the patient achieving the goals of the procedure, and any potential problems that might occur during the procedure or recuperation. All questions answered. Patient elects to proceed.   [name] 07/11/2023

## 2023-07-11 NOTE — Op Note (Signed)
 Preoperative diagnosis: Neurogenic bladder Post diagnosis: Neurogenic bladder  Procedure: Cystoscopy with intravesical Botox injection  Surgeon: Mena Goes  Anesthesia: General  Indication for procedure: Ryan Watkins is a 38 year old paraplegic male who does CIC.  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  Findings: Urethra was snug against the injection sheath but passable.  Prostate and obstructing.  No urethral stricture.  Bladder appeared normal without mucosal lesion or stone.  Description of procedure: After consent was obtained patient brought to the operating room.  After adequate anesthesia he is placed in lithotomy position and prepped and draped in the usual sterile fashion.  Timeout was performed to confirm the patient and procedure.  The scope and sheath with the injection needle were advanced per urethra into the bladder.  The bladder was triple rinsed and inspected.  We then injected 300 units of Botox in the usual grid fashion of 1 mL (10 units/mL) injections for 30 injections total.  Hemostasis was excellent.  No concern for perforation.  The scope was backed out and a 16 [place] Foley catheter was placed left to gravity drainage.  Urine was clear.  He was awakened taken the cover room in stable condition.  Complications: None  Blood loss: Minimal  Specimens: None  Drains: 16 French Foley catheter  Disposition: Patient stable to PACU

## 2023-07-11 NOTE — Anesthesia Postprocedure Evaluation (Signed)
 Anesthesia Post Note  Patient: Ryan Watkins  Procedure(s) Performed: CYSTOSCOPY BOTOX INJECTION     Patient location during evaluation: PACU Anesthesia Type: General Level of consciousness: awake and alert Pain management: pain level controlled Vital Signs Assessment: post-procedure vital signs reviewed and stable Respiratory status: spontaneous breathing, nonlabored ventilation, respiratory function stable and patient connected to nasal cannula oxygen Cardiovascular status: blood pressure returned to baseline and stable Postop Assessment: no apparent nausea or vomiting Anesthetic complications: no  No notable events documented.  Last Vitals:  Vitals:   07/11/23 1312 07/11/23 1315  BP: 110/87 110/87  Pulse: 60 63  Resp: 10 19  Temp: (!) 36.4 C   SpO2: 95% 95%    Last Pain:  Vitals:   07/11/23 1345  TempSrc:   PainSc: 0-No pain                 Trevor Iha

## 2023-07-11 NOTE — Transfer of Care (Signed)
 Immediate Anesthesia Transfer of Care Note  Patient: Ryan Watkins  Procedure(s) Performed: CYSTOSCOPY BOTOX INJECTION  Patient Location: PACU  Anesthesia Type:General  Level of Consciousness: awake, alert , oriented, and patient cooperative  Airway & Oxygen Therapy: Patient Spontanous Breathing and Patient connected to face mask oxygen  Post-op Assessment: Report given to RN and Post -op Vital signs reviewed and stable  Post vital signs: Reviewed and stable  Last Vitals:  Vitals Value Taken Time  BP 119/99 07/11/23 1241  Temp 36.4 C 07/11/23 1241  Pulse 63 07/11/23 1244  Resp 17 07/11/23 1244  SpO2 100 % 07/11/23 1244  Vitals shown include unfiled device data.  Last Pain:  Vitals:   07/11/23 1028  TempSrc:   PainSc: 0-No pain         Complications: No notable events documented.

## 2023-07-12 ENCOUNTER — Encounter (HOSPITAL_COMMUNITY): Payer: Self-pay | Admitting: Urology

## 2024-03-02 ENCOUNTER — Other Ambulatory Visit: Payer: Self-pay | Admitting: Urology

## 2024-03-19 ENCOUNTER — Encounter (HOSPITAL_COMMUNITY)

## 2024-04-13 NOTE — Progress Notes (Signed)
 The patient was identified using 2 approved identifiers. All issues noted in this document were discussed and addressed, Mr/ Ms            voiced understanding and agreement with all preoperative instructions. The patient was emailed the surgery instructions per his / her request.     The patient was instructed to call our Pharmacy [phone]) and the Admitting Office [phone] or [phone]) to complete their Pre-surgical Interview.     COVID Vaccine received:  []  No [x]  Yes Date of any COVID positive Test in last 90 days:  PCP - Kip Corrington, MD  Cardiologist -   Chest x-ray -  EKG -   Stress Test -  ECHO - 10-05-2021 CE Cardiac Cath -  CT Coronary Calcium score:   Pacemaker / ICD device [x]  No []  Yes   Spinal Cord Stimulator:[x]  No []  Yes       History of Sleep Apnea? []  No []  Yes   CPAP used?- []  No []  Yes    Medication on DOS: Dulera inhaler prn  Patient has: [x]  NO Hx DM   []  Pre-DM   []  DM1  []   DM2 Does the patient monitor blood sugar?   [x]  N/A   []  No []  Yes  Last A1c was: 4.9 on 12-01-2021      Blood Thinner / Instructions:  none Aspirin Instructions:  none  Activity level: Able to walk up 2 flights of stairs without becoming significantly short of breath or having chest pain?  [x]  No   []    Yes   Patient is quadriplegic Patient can perform ADLs without assistance. [x]  No   []   Yes  Comments:   Anesthesia review: Heart Murmur (ECHO 10-05-2021)  quadriplegic (C1-C4)  - neurogenic bladder, thrombocytopenia, anemia, Palps  Patient denies any S&S of respiratory illness or Covid - no shortness of breath, fever, cough or chest pain at PAT appointment.  Patient verbalized understanding and agreement to the Pre-Surgical Instructions that were given to them at this PAT appointment. Patient was also educated of the need to review these PAT instructions again prior to his surgery.I reviewed the appropriate phone numbers to call if they have any and questions or  concerns.

## 2024-04-13 NOTE — Patient Instructions (Signed)
 SURGICAL WAITING ROOM VISITATION Patients having surgery or a procedure may have no more than 2 support people in the waiting area - these visitors may rotate in the visitor waiting room.   If the patient needs to stay at the hospital during part of their recovery, the visitor guidelines for inpatient rooms apply.  PRE-OP VISITATION  Pre-op nurse will coordinate an appropriate time for 1 support person to accompany the patient in pre-op.  This support person may not rotate.  This visitor will be contacted when the time is appropriate for the visitor to come back in the pre-op area.  To keep our patients, visitors and teammates safe and prevent the spread of respiratory illnesses over the next few months.  Temporary Visitor Restrictions  Children ages 7 and under will not be able to visit patients in [facility] under most circumstances. Visitation is not restricted outside of hospitals unless noted otherwise in the [facility] and Location Specific Visitation Guidelines at:       [email or web address]  Visitors with respiratory illnesses are discouraged from visiting and should remain at home. You are not required to quarantine at this time prior to your surgery. However, you must do this: Hand Hygiene often Do NOT share personal items Notify your provider if you are in close contact with someone who has COVID or you develop fever 100.4 or greater, new onset of sneezing, cough, sore throat, shortness of breath or body aches.  If you test positive for Covid or have been in contact with anyone that has tested positive in the last 10 days please notify you surgeon.    Your procedure is scheduled on:  Tuesday  04-20-24  Report to [facility] Main Entrance: [address] entrance where the [employer] is available.   Report to admitting at:  08:45   AM  Call this number if you have any questions or problems the morning of surgery [phone]  DO NOT EAT OR DRINK ANYTHING AFTER  MIDNIGHT THE NIGHT PRIOR TO YOUR SURGERY / PROCEDURE.   FOLLOW  ANY ADDITIONAL PRE OP INSTRUCTIONS YOU RECEIVED FROM YOUR SURGEON'S OFFICE!!!   Oral Hygiene is also important to reduce your risk of infection.        Remember - BRUSH YOUR TEETH THE MORNING OF SURGERY WITH YOUR REGULAR TOOTHPASTE  Do NOT smoke after Midnight the night before surgery.  STOP TAKING all Vitamins, Herbs and supplements 1 week before your surgery.   Take ONLY these medicines the morning of surgery with A SIP OF WATER :  Dulera inhaler if needed.                   You may not have any metal on your body including jewelry, and body piercing  Do not wear  lotions, powders, cologne, or deodorant  Men may shave face and neck.  Contacts, Hearing Aids, dentures or bridgework may not be worn into surgery. DENTURES WILL BE REMOVED PRIOR TO SURGERY PLEASE DO NOT APPLY Poly grip OR ADHESIVES!!!  Patients discharged on the day of surgery will not be allowed to drive home.  Someone NEEDS to stay with you for the first 24 hours after anesthesia.  Do not bring your home medications to the hospital. The Pharmacy will dispense medications listed on your medication list to you during your admission in the Hospital.  Please read over the following fact sheets you were given: IF YOU HAVE QUESTIONS ABOUT YOUR PRE-OP INSTRUCTIONS, PLEASE CALL [phone].    [facility] - Preparing for Surgery Before surgery, you can play an important role.  Because skin is not sterile, your skin needs to be as free of germs as possible.  You can reduce the number of germs on your skin by washing with Antibacterial soap before surgery.  . Do not shave (including legs and underarms) for at least 48 hours prior to the first shower.  You may shave your face/neck.  Please follow these instructions carefully:  1.  Shower with antibacterial Soap the night before surgery and the  morning of surgery.  2.  If you choose to wash your hair, wash your  hair first as usual with your normal  shampoo.  3.  After you shampoo, rinse your hair and body thoroughly to remove the shampoo.                             4.  You can apply soap directly to the skin and wash.  Gently with a scrungie or clean washcloth.  5.  Wash face,  Genitals (private parts) with your normal soap.             6.  Wash thoroughly, paying special attention to the area where your  surgery  will be performed.  7.  Thoroughly rinse your body with warm water  from the neck down.  8.   Pat yourself dry with a clean towel.             9  Wear clean pajamas.            10 Place clean sheets on your bed the night of your first shower and do not  sleep with pets.  ON THE DAY OF SURGERY : Do not apply any lotions/deodorants the morning of surgery.  Please wear clean clothes to the hospital/surgery center.  FAILURE TO FOLLOW THESE INSTRUCTIONS MAY RESULT IN THE CANCELLATION OF YOUR SURGERY  PATIENT SIGNATURE_________________________________  NURSE SIGNATURE__________________________________

## 2024-04-14 ENCOUNTER — Other Ambulatory Visit: Payer: Self-pay

## 2024-04-14 ENCOUNTER — Encounter (HOSPITAL_COMMUNITY)
Admission: RE | Admit: 2024-04-14 | Discharge: 2024-04-14 | Disposition: A | Source: Ambulatory Visit | Attending: Urology

## 2024-04-14 ENCOUNTER — Encounter (HOSPITAL_COMMUNITY): Payer: Self-pay

## 2024-04-14 VITALS — Ht 72.0 in | Wt 170.0 lb

## 2024-04-14 DIAGNOSIS — Z01818 Encounter for other preprocedural examination: Secondary | ICD-10-CM

## 2024-04-19 MED ORDER — CEFAZOLIN SODIUM-DEXTROSE 2-4 GM/100ML-% IV SOLN: 2.0000 g | INTRAVENOUS | Status: DC

## 2024-04-20 ENCOUNTER — Encounter (HOSPITAL_COMMUNITY): Payer: Self-pay | Admitting: Urology

## 2024-04-20 ENCOUNTER — Encounter (HOSPITAL_COMMUNITY): Payer: Self-pay | Admitting: Physician Assistant

## 2024-04-20 ENCOUNTER — Ambulatory Visit (HOSPITAL_COMMUNITY)
Admission: RE | Admit: 2024-04-20 | Discharge: 2024-04-20 | Disposition: A | Source: Ambulatory Visit | Attending: Urology | Admitting: Urology

## 2024-04-20 ENCOUNTER — Encounter (HOSPITAL_COMMUNITY): Admission: RE | Disposition: A | Payer: Self-pay | Source: Ambulatory Visit | Attending: Urology

## 2024-04-20 ENCOUNTER — Ambulatory Visit (HOSPITAL_COMMUNITY): Admitting: Anesthesiology

## 2024-04-20 DIAGNOSIS — N319 Neuromuscular dysfunction of bladder, unspecified: Secondary | ICD-10-CM

## 2024-04-20 DIAGNOSIS — N310 Uninhibited neuropathic bladder, not elsewhere classified: Secondary | ICD-10-CM | POA: Diagnosis present

## 2024-04-20 DIAGNOSIS — J45909 Unspecified asthma, uncomplicated: Secondary | ICD-10-CM

## 2024-04-20 DIAGNOSIS — G8250 Quadriplegia, unspecified: Secondary | ICD-10-CM | POA: Insufficient documentation

## 2024-04-20 DIAGNOSIS — Z01818 Encounter for other preprocedural examination: Secondary | ICD-10-CM

## 2024-04-20 HISTORY — PX: CYSTOSCOPY WITH INJECTION: SHX1424

## 2024-04-20 LAB — BASIC METABOLIC PANEL WITH GFR
Anion gap: 10 (ref 5–15)
BUN: 13 mg/dL (ref 6–20)
CO2: 23 mmol/L (ref 22–32)
Calcium: 8.8 mg/dL — ABNORMAL LOW (ref 8.9–10.3)
Chloride: 106 mmol/L (ref 98–111)
Creatinine, Ser: 0.56 mg/dL — ABNORMAL LOW (ref 0.61–1.24)
GFR, Estimated: >60 mL/min (ref >60)
Glucose, Bld: 113 mg/dL — ABNORMAL HIGH (ref 70–99)
Potassium: 3.9 mmol/L (ref 3.5–5.1)
Sodium: 139 mmol/L (ref 135–145)

## 2024-04-20 LAB — CBC
HCT: 37.6 % — ABNORMAL LOW (ref 39.0–52.0)
Hemoglobin: 12.8 g/dL — ABNORMAL LOW (ref 13.0–17.0)
MCH: 28.9 pg (ref 26.0–34.0)
MCHC: 34 g/dL (ref 30.0–36.0)
MCV: 84.9 fL (ref 80.0–100.0)
Platelets: 164 K/uL (ref 150–400)
RBC: 4.43 MIL/uL (ref 4.22–5.81)
RDW: 13.2 % (ref 11.5–15.5)
WBC: 6.6 K/uL (ref 4.0–10.5)
nRBC: 0 % (ref 0.0–0.2)

## 2024-04-20 SURGERY — CYSTOSCOPY, WITH INJECTION OF BLADDER NECK OR BLADDER WALL
Anesthesia: General

## 2024-04-20 MED ORDER — PROPOFOL 10 MG/ML IV BOLUS
INTRAVENOUS | Status: DC | PRN
  Administered 2024-04-20: 11:00:00 160 mg via INTRAVENOUS

## 2024-04-20 MED ORDER — SODIUM CHLORIDE 0.9 % IR SOLN
Status: DC | PRN
  Administered 2024-04-20: 12:00:00 3000 mL via INTRAVESICAL

## 2024-04-20 MED ORDER — FENTANYL CITRATE (PF) 50 MCG/ML IJ SOSY: 25.0000 ug | PREFILLED_SYRINGE | INTRAMUSCULAR | Status: DC | PRN

## 2024-04-20 MED ORDER — FENTANYL CITRATE (PF) 100 MCG/2ML IJ SOLN
INTRAMUSCULAR | Status: AC
  Filled 2024-04-20: qty 2

## 2024-04-20 MED ORDER — ONABOTULINUMTOXINA 100 UNITS IJ SOLR
INTRAMUSCULAR | Status: DC | PRN
  Administered 2024-04-20: 12:00:00 300 [IU] via INTRAMUSCULAR

## 2024-04-20 MED ORDER — ORAL CARE MOUTH RINSE: 15.0000 mL | Freq: Once | OROMUCOSAL | Status: AC

## 2024-04-20 MED ORDER — ONABOTULINUMTOXINA 100 UNITS IJ SOLR
INTRAMUSCULAR | Status: AC
  Filled 2024-04-20: qty 200

## 2024-04-20 MED ORDER — LIDOCAINE HCL (CARDIAC) PF 100 MG/5ML IV SOSY
PREFILLED_SYRINGE | INTRAVENOUS | Status: DC | PRN
  Administered 2024-04-20: 11:00:00 60 mg via INTRAVENOUS

## 2024-04-20 MED ORDER — LACTATED RINGERS IV SOLN: INTRAVENOUS | Status: DC

## 2024-04-20 MED ORDER — MIDAZOLAM HCL 5 MG/5ML IJ SOLN
INTRAMUSCULAR | Status: DC | PRN
  Administered 2024-04-20: 11:00:00 2 mg via INTRAVENOUS

## 2024-04-20 MED ORDER — ACETAMINOPHEN 10 MG/ML IV SOLN: 1000.0000 mg | Freq: Once | INTRAVENOUS | Status: DC | PRN

## 2024-04-20 MED ORDER — SODIUM CHLORIDE (PF) 0.9 % IJ SOLN
INTRAMUSCULAR | Status: DC | PRN
  Administered 2024-04-20: 12:00:00 30 mL

## 2024-04-20 MED ORDER — GENTAMICIN SULFATE 40 MG/ML IJ SOLN
5.0000 mg/kg | INTRAVENOUS | Status: AC
  Administered 2024-04-20: 12:00:00 385.6 mg via INTRAVENOUS
  Filled 2024-04-20: qty 9.75

## 2024-04-20 MED ORDER — LIDOCAINE HCL (PF) 2 % IJ SOLN
INTRAMUSCULAR | Status: AC
  Filled 2024-04-20: qty 5

## 2024-04-20 MED ORDER — MIDAZOLAM HCL 2 MG/2ML IJ SOLN
INTRAMUSCULAR | Status: AC
  Filled 2024-04-20: qty 2

## 2024-04-20 MED ORDER — SODIUM CHLORIDE 0.9 % IV SOLN
2.0000 g | Freq: Once | INTRAVENOUS | Status: AC
  Administered 2024-04-20: 11:00:00 2 g via INTRAVENOUS
  Filled 2024-04-20: qty 2

## 2024-04-20 MED ORDER — OXYCODONE HCL 5 MG/5ML PO SOLN: 5.0000 mg | Freq: Once | ORAL | Status: DC | PRN

## 2024-04-20 MED ORDER — STERILE WATER FOR IRRIGATION IR SOLN
Status: DC | PRN
  Administered 2024-04-20: 12:00:00 500 mL

## 2024-04-20 MED ORDER — ONDANSETRON HCL 4 MG/2ML IJ SOLN
INTRAMUSCULAR | Status: DC | PRN
  Administered 2024-04-20: 11:00:00 4 mg via INTRAVENOUS

## 2024-04-20 MED ORDER — OXYCODONE HCL 5 MG PO TABS: 5.0000 mg | ORAL_TABLET | Freq: Once | ORAL | Status: DC | PRN

## 2024-04-20 MED ORDER — ONABOTULINUMTOXINA 100 UNITS IJ SOLR
INTRAMUSCULAR | Status: AC
  Filled 2024-04-20: qty 100

## 2024-04-20 MED ORDER — DEXAMETHASONE SODIUM PHOSPHATE 4 MG/ML IJ SOLN
INTRAMUSCULAR | Status: DC | PRN
  Administered 2024-04-20: 11:00:00 10 mg via INTRAVENOUS

## 2024-04-20 MED ORDER — CHLORHEXIDINE GLUCONATE 0.12 % MT SOLN
15.0000 mL | Freq: Once | OROMUCOSAL | Status: AC
  Administered 2024-04-20: 10:00:00 15 mL via OROMUCOSAL

## 2024-04-20 MED ORDER — SODIUM CHLORIDE (PF) 0.9 % IJ SOLN
INTRAMUSCULAR | Status: AC
  Filled 2024-04-20: qty 20

## 2024-04-20 MED ORDER — ONDANSETRON HCL 4 MG/2ML IJ SOLN
INTRAMUSCULAR | Status: AC
  Filled 2024-04-20: qty 2

## 2024-04-20 MED ORDER — PROPOFOL 10 MG/ML IV BOLUS
INTRAVENOUS | Status: AC
  Filled 2024-04-20: qty 20

## 2024-04-20 MED ORDER — DROPERIDOL 2.5 MG/ML IJ SOLN: 0.6250 mg | Freq: Once | INTRAMUSCULAR | Status: DC | PRN

## 2024-04-20 SURGICAL SUPPLY — 17 items
BAG URINE DRAIN 2000ML AR STRL (UROLOGICAL SUPPLIES) IMPLANT
BAG URO CATCHER STRL LF (MISCELLANEOUS) ×1 IMPLANT
CATH FOLEY 2WAY SLVR 5CC 16FR (CATHETERS) IMPLANT
CLOTH BEACON ORANGE TIMEOUT ST (SAFETY) ×1 IMPLANT
GLOVE SURG LX STRL 7.5 STRW (GLOVE) ×1 IMPLANT
GOWN STRL REUS W/ TWL XL LVL3 (GOWN DISPOSABLE) ×1 IMPLANT
HOLDER FOLEY CATH W/STRAP (MISCELLANEOUS) IMPLANT
KIT TURNOVER KIT A (KITS) ×1 IMPLANT
MANIFOLD NEPTUNE II (INSTRUMENTS) ×1 IMPLANT
NDL ASPIRATION 22 (NEEDLE) ×1 IMPLANT
NDL SAFETY ECLIPSE 18X1.5 (NEEDLE) IMPLANT
NEEDLE ASPIRATION 22 (NEEDLE) ×1 IMPLANT
PACK CYSTO (CUSTOM PROCEDURE TRAY) ×1 IMPLANT
PENCIL SMOKE EVACUATOR (MISCELLANEOUS) IMPLANT
SYR CONTROL 10ML LL (SYRINGE) IMPLANT
TUBING CONNECTING 10 (TUBING) IMPLANT
WATER STERILE IRR 3000ML UROMA (IV SOLUTION) ×1 IMPLANT

## 2024-04-20 NOTE — H&P (Signed)
 H&P  Chief Complaint: Neurogenic bladder  History of Present Illness: Ryan Watkins is a 38 year old male with a history of quadriplegia.  He has neurogenic bladder maintained by CIC and gets periodic Botox  injection.  Last March 2025.  He has dysreflexia symptoms returning.  Urine culture October 2025 office visit grew E. coli and he was given a prescription to start for nitrofurantoin .  Preoperative antibiotics would be cefoxitin , meropenem or ertapenem.  He started antibiotics.  He has not had any fever.  Urine is clear.  He has a history of kidney stones with February 2025 CT and October 2025 ultrasound not revealing any significant stone burden maybe up to 8 mm stones.  Past Medical History:  Diagnosis Date   Anemia    History of kidney stones    Neurogenic bladder    caths 4 x day   Palpitations    Pneumonia 2009   Quadriplegia (HCC) 08/2003   C 4   Sinus infection few weeks ago, off all antibiotics   lungs clear no cough   Thrombocytopenia    UTI (urinary tract infection)    completed Cipro  07/18/11   Past Surgical History:  Procedure Laterality Date   bladder surgery to remove kidney stones  2006   BOTOX  INJECTION N/A 06/29/2013   Procedure: BOTOX  INJECTION;  Surgeon: Donnice Gwenyth Brooks, MD;  Location: [facility];  Service: Urology;  Laterality: N/A;   BOTOX  INJECTION N/A 09/10/2016   Procedure: BOTOX  INJECTION;  Surgeon: Brooks Donnice, MD;  [facility];  Service: Urology;  Laterality: N/A;   BOTOX  INJECTION N/A 04/24/2018   Procedure: BOTOX  INJECTION WITH CYSTOSCOPY;  Surgeon: Brooks Donnice, MD;  [facility];  Service: Urology;  Laterality: N/A;   BOTOX  INJECTION N/A 01/29/2019   Procedure: BOTOX  INJECTION WITH CYSTOSCOPY;  Surgeon: Brooks Donnice, MD;  [facility];  Service: Urology;  Laterality: N/A;  ONLY NEEDS 90 MIN FOR ALL PROCEDURES   BOTOX  INJECTION N/A 09/24/2019   Procedure: BOTOX  INJECTION INTO BLADDER WITH CYSTOSCOPY;  Surgeon: Brooks Donnice,  MD;  [facility];  Service: Urology;  Laterality: N/A;  ONLY NEEDS 60 MIN   BOTOX  INJECTION N/A 01/28/2020   Procedure: CYSTOSCOPY WITH BOTOX  INJECTION 300 UNITS, CYSTOLYTHOPXY;  Surgeon: Brooks Donnice, MD;  [facility];  Service: Urology;  Laterality: N/A;   BOTOX  INJECTION N/A 08/15/2020   Procedure: BOTOX  INJECTION WITH CYSTOSCOPY;  Surgeon: Brooks Donnice, MD;  [facility];  Service: Urology;  Laterality: N/A;   BOTOX  INJECTION N/A 05/04/2021   Procedure: BOTOX  INJECTION;  Surgeon: Brooks Donnice, MD;  [facility];  Service: Urology;  Laterality: N/A;   BOTOX  INJECTION N/A 10/23/2021   Procedure: BOTOX  INJECTION WITH CYSTOSCOPY, RIGHT RETROGRADE PYELOGRAM, RIGHT URETAL STENT PLACEMENT;  Surgeon: Brooks Donnice, MD;  [facility];  Service: Urology;  Laterality: N/A;   BOTOX  INJECTION N/A 05/17/2022   Procedure: CYSTOSCOPY BOTOX  INJECTION;  Surgeon: Brooks Donnice, MD;  [facility];  Service: Urology;  Laterality: N/A;  30 MINS FOR CASE   BOTOX  INJECTION N/A 02/25/2023   Procedure: CYSTOSCOPY BOTOX  INJECTION;  Surgeon: Brooks Donnice, MD;  [facility];  Service: Urology;  Laterality: N/A;  45 MINS FOR CASE   BOTOX  INJECTION N/A 07/11/2023   Procedure: BOTOX  INJECTION;  Surgeon: Brooks Donnice, MD;  [facility];  Service: Urology;  Laterality: N/A;   cervical spinal surgery C 4, C5 and C6  08-2003   CYSTOSCOPY  02/18/2012   Procedure: CYSTOSCOPY;  Surgeon: Donnice  Gwenyth Brooks, MD;  [facility];  Service: Urology;  Laterality: N/A;   CYSTOSCOPY N/A 08/07/2012   Procedure: CYSTOSCOPY WITH BOTOX  INJECTION ;  Surgeon: Donnice Gwenyth Brooks, MD;  [facility];  Service: Urology;  Laterality: N/A;   CYSTOSCOPY N/A 09/10/2016   Procedure: CYSTOSCOPY WITH BOTOX  INJECTION;  Surgeon: Brooks Donnice, MD;  [facility];  Service: Urology;  Laterality: N/A;   CYSTOSCOPY N/A 07/11/2023   Procedure: CYSTOSCOPY;  Surgeon: Brooks Donnice, MD;  [facility];  Service: Urology;  Laterality: N/A;  45 MINUTE CASE   CYSTOSCOPY WITH INJECTION  07/23/2011   Procedure: CYSTOSCOPY WITH INJECTION;  Surgeon: Donnice Gwenyth Brooks, MD;  [facility];  Service: Urology;  Laterality: N/A;   CYSTOSCOPY WITH INJECTION N/A 03/29/2014   Procedure: CYSTOSCOPY WITH BOTOX  INJECTION;  Surgeon: Donnice Brooks, MD;  [facility];  Service: Urology;  Laterality: N/A;   CYSTOSCOPY WITH INJECTION N/A 12/13/2014   Procedure: CYSTOSCOPY WITH BOTOX ;  Surgeon: Donnice Brooks, MD;  [facility];  Service: Urology;  Laterality: N/A;   CYSTOSCOPY WITH INJECTION N/A 06/16/2015   Procedure: CYSTOSCOPY WITH BOTOX  INJECTION;  Surgeon: Donnice Brooks, MD;  [facility];  Service: Urology;  Laterality: N/A;   CYSTOSCOPY WITH INJECTION N/A 06/10/2017   Procedure: CYSTOSCOPY WITH INJECTION/ BOTOX ;  Surgeon: Brooks Donnice, MD;  [facility];  Service: Urology;  Laterality: N/A;   CYSTOSCOPY WITH RETROGRADE PYELOGRAM, URETEROSCOPY AND STENT PLACEMENT Right 07/13/2013   Procedure: CYSTOSCOPY RIGHT URETEROSCOPY/LASER LITHROSCOPY GEROME BASKET EXTRACTION STENT PLACEMENT;  Surgeon: Donnice Gwenyth Brooks, MD;  [facility];  Service: Urology;  Laterality: Right;   CYSTOSCOPY WITH URETEROSCOPY AND STENT PLACEMENT Right 06/29/2013   Procedure: CYSTOSCOPY WITH RETROGRADE, URETEROSCOPY AND STENT PLACEMENT ON RIGHT;  Surgeon: Donnice Gwenyth Brooks, MD;  [facility];  Service: Urology;  Laterality: Right;   CYSTOSCOPY/URETEROSCOPY/HOLMIUM LASER/STENT PLACEMENT Right 06/10/2017   Procedure: CYSTOSCOPY/RETROGRADE/URETEROSCOPY/STENT PLACEMENT;  Surgeon: Brooks Donnice, MD;  [facility];  Service: Urology;  Laterality: Right;   CYSTOSCOPY/URETEROSCOPY/HOLMIUM LASER/STENT PLACEMENT Right 07/01/2017   Procedure: CYSTOSCOPY/URETEROSCOPY/HOLMIUM LASER/STENT PLACEMENT;  Surgeon: Brooks Donnice, MD;  [facility];  Service: Urology;   Laterality: Right;   CYSTOSCOPY/URETEROSCOPY/HOLMIUM LASER/STENT PLACEMENT Right 08/15/2020   Procedure: RETROGRADE;  Surgeon: Brooks Donnice, MD;  [facility];  Service: Urology;  Laterality: Right;   CYSTOSCOPY/URETEROSCOPY/HOLMIUM LASER/STENT PLACEMENT Right 05/04/2021   Procedure: CYSTOSCOPY/LITHOLAPAXY/ FOR A 1CM STONE/RETROGRADE;  Surgeon: Brooks Donnice, MD;  [facility];  Service: Urology;  Laterality: Right;   CYSTOSCOPY/URETEROSCOPY/HOLMIUM LASER/STENT PLACEMENT Right 11/16/2021   Procedure: CYSTOSCOPY/ RIGHT URETEROSCOPY/HOLMIUM LASER/RETROGRADE/STENT EXCHANGE;  Surgeon: Brooks Donnice, MD;  [facility];  Service: Urology;  Laterality: Right;   duodenal surgery  feb 2005   HOLMIUM LASER APPLICATION Right 07/13/2013   Procedure: RIGHT LASER LITHOTRIPSY;  Surgeon: Donnice Gwenyth Brooks, MD;  [facility];  Service: Urology;  Laterality: Right;   sacral skin flap surgery  june 2005   on sacrum   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   done in [place]   URETEROSCOPY WITH HOLMIUM LASER LITHOTRIPSY Right 01/29/2019   Procedure: CYSTOSCOPY, URETEROSCOPY, BILATERAL RETR0GRADE PYLEOGRAM, AND BILATERAL STENT PLACEMENT;  Surgeon: Brooks Donnice, MD;  [facility];  Service: Urology;  Laterality: Right;   URETEROSCOPY WITH HOLMIUM LASER LITHOTRIPSY Bilateral 03/02/2019   Procedure: CYSTOSCOPY/URETEROSCOPY WITH HOLMIUM LASER LITHOTRIPSY/ RETROGRADE PYELOGRAM/ STENT EXCHANGE;  Surgeon: Brooks Donnice, MD;  [facility];  Service: Urology;  Laterality: Bilateral;  vena cana filter placement  08-2003   right groin   WISDOM TOOTH EXTRACTION  2009    Home Medications:  No medications prior to admission.   Allergies: Allergies[1]  No family history on file. Social History:  reports that he has never smoked. He has never used smokeless tobacco. He reports current alcohol use. He reports that he does not use drugs.  ROS: A complete review of  systems was performed.  All systems are negative except for pertinent findings as noted. Review of Systems  All other systems reviewed and are negative.    Physical Exam:  Vital signs in last 24 hours:   General:  Alert and oriented, No acute distress HEENT: Normocephalic, atraumatic Cardiovascular: Regular rate and rhythm Lungs: Regular rate and effort Abdomen: Soft, nontender, nondistended, no abdominal masses Back: No CVA tenderness Extremities: No edema Neurologic: Grossly intact  Laboratory Data:  No results found for this or any previous visit (from the past 24 hours). No results found for this or any previous visit (from the past 240 hours). Creatinine: No results for input(s): CREATININE in the last 168 hours.  Impression/Assessment:  Quadriplegia, neurogenic bladder-on CIC-  Plan:  I discussed with the patient the nature, potential benefits, risks and alternatives to cystoscopy with Botox  injection, including side effects of the proposed treatment, the likelihood of the patient achieving the goals of the procedure, and any potential problems that might occur during the procedure or recuperation. All questions answered. Patient elects to proceed.  He prefers to keep a Foley for a few days postoperatively.   Donnice Brooks 04/20/2024     [1]  Allergies Allergen Reactions   Morphine  And Codeine Other (See Comments)    hallucinations

## 2024-04-20 NOTE — Anesthesia Procedure Notes (Signed)
 Procedure Name: LMA Insertion Date/Time: 04/20/2024 11:17 AM  Performed by: Deeann Eva BROCKS, CRNAPre-anesthesia Checklist: Patient identified, Emergency Drugs available, Suction available and Patient being monitored Patient Re-evaluated:Patient Re-evaluated prior to induction Oxygen Delivery Method: Circle System Utilized Preoxygenation: Pre-oxygenation with 100% oxygen Induction Type: IV induction Ventilation: Mask ventilation without difficulty LMA: LMA with gastric port inserted LMA Size: 4.0 Number of attempts: 1 Airway Equipment and Method: Bite block Placement Confirmation: positive ETCO2 Tube secured with: Tape Dental Injury: Teeth and Oropharynx as per pre-operative assessment

## 2024-04-20 NOTE — Anesthesia Preprocedure Evaluation (Signed)
 Anesthesia Evaluation  Patient identified by MRN, date of birth, ID band Patient awake    Reviewed: Allergy & Precautions, NPO status , Patient's Chart, lab work & pertinent test results  History of Anesthesia Complications Negative for: history of anesthetic complications  Airway Mallampati: II  TM Distance: >3 FB Neck ROM: Full    Dental no notable dental hx. (+) Teeth Intact   Pulmonary asthma , neg sleep apnea, neg COPD, Patient abstained from smoking.Not current smoker   Pulmonary exam normal breath sounds clear to auscultation       Cardiovascular Exercise Tolerance: Good METS(-) hypertension(-) CAD and (-) Past MI negative cardio ROS (-) dysrhythmias  Rhythm:Regular Rate:Normal - Systolic murmurs    Neuro/Psych Quadriplegic   Neuromuscular disease  negative psych ROS   GI/Hepatic ,neg GERD  ,,(+)     (-) substance abuse    Endo/Other  neg diabetes    Renal/GU negative Renal ROS     Musculoskeletal   Abdominal   Peds  Hematology   Anesthesia Other Findings Past Medical History: No date: Anemia No date: History of kidney stones No date: Neurogenic bladder     Comment:  caths 4 x day No date: Palpitations 2009: Pneumonia 08/2003: Quadriplegia (HCC)     Comment:  C 4 few weeks ago, off all antibiotics: Sinus infection     Comment:  lungs clear no cough No date: Thrombocytopenia No date: UTI (urinary tract infection)     Comment:  completed Cipro  07/18/11  Reproductive/Obstetrics                              Anesthesia Physical Anesthesia Plan  ASA: 3  Anesthesia Plan: General   Post-op Pain Management: Minimal or no pain anticipated   Induction: Intravenous  PONV Risk Score and Plan: 3 and Ondansetron , Dexamethasone  and Midazolam   Airway Management Planned: LMA  Additional Equipment: None  Intra-op Plan:   Post-operative Plan: Extubation in OR  Informed  Consent: I have reviewed the patients History and Physical, chart, labs and discussed the procedure including the risks, benefits and alternatives for the proposed anesthesia with the patient or authorized representative who has indicated his/her understanding and acceptance.     Dental advisory given  Plan Discussed with: CRNA and Surgeon  Anesthesia Plan Comments: (Discussed risks of anesthesia with patient, including PONV, sore throat, lip/dental/eye damage. Rare risks discussed as well, such as cardiorespiratory and neurological sequelae, and allergic reactions. Discussed the role of CRNA in patient's perioperative care. Patient understands.)        Anesthesia Quick Evaluation

## 2024-04-20 NOTE — Anesthesia Postprocedure Evaluation (Signed)
 Anesthesia Post Note  Patient: Ryan Watkins  Procedure(s) Performed: CYSTOSCOPY, WITH INJECTION OF BLADDER NECK OR BLADDER WALL     Patient location during evaluation: PACU Anesthesia Type: General Level of consciousness: awake and alert Pain management: pain level controlled Vital Signs Assessment: post-procedure vital signs reviewed and stable Respiratory status: spontaneous breathing, nonlabored ventilation, respiratory function stable and patient connected to nasal cannula oxygen Cardiovascular status: blood pressure returned to baseline and stable Postop Assessment: no apparent nausea or vomiting Anesthetic complications: no   No notable events documented.  Last Vitals:  Vitals:   04/20/24 1232 04/20/24 1236  BP: (!) 140/103 (!) 132/97  Pulse: (!) 52 (!) 55  Resp: 12 20  Temp: (!) 36.1 C (!) 36.1 C  SpO2: 99% 100%    Last Pain:  Vitals:   04/20/24 1236  TempSrc: Temporal  PainSc: 0-No pain                 Rome Ade

## 2024-04-20 NOTE — Discharge Instructions (Signed)
 Remove the Foley in 2 to 3 days.

## 2024-04-20 NOTE — Transfer of Care (Signed)
 Immediate Anesthesia Transfer of Care Note  Patient: Ryan Watkins  Procedure(s) Performed: CYSTOSCOPY, WITH INJECTION OF BLADDER NECK OR BLADDER WALL  Patient Location: PACU  Anesthesia Type:General  Level of Consciousness: drowsy and patient cooperative  Airway & Oxygen Therapy: Patient Spontanous Breathing and Patient connected to face mask oxygen  Post-op Assessment: Report given to RN and Post -op Vital signs reviewed and stable  Post vital signs: Reviewed and stable  Last Vitals:  Vitals Value Taken Time  BP 139/113 04/20/24 11:56  Temp    Pulse 55 04/20/24 11:58  Resp 11 04/20/24 11:58  SpO2 100 % 04/20/24 11:58  Vitals shown include unfiled device data.  Last Pain:  Vitals:   04/20/24 0941  TempSrc:   PainSc: 0-No pain         Complications: No notable events documented.

## 2024-04-20 NOTE — Op Note (Signed)
 Preoperative diagnosis: Neurogenic bladder Postoperative diagnosis: Same  Procedure: Cystoscopy with Botox  injection, 300 units  Surgeon: Nieves  Anesthesia: General  Indication for procedure: Ryan Watkins is a 38 year old male with a history of neurogenic bladder.  He does CIC.  He has noticed some incontinence and urgency and is ready for more Botox .  Findings: The glans and meatus appeared normal.  Scrotum appeared normal.  There are some mild squamous metaplasia along the urethra and prostatic urethra but no mass or stricture.  The bladder appeared normal without stone or foreign body.  The ureteral orifices were in their normal orthotopic position with clear reflux.  The bladder mucosa appeared normal without lesion.  Description of procedure: After consent was obtained patient brought to the operating room.  After adequate anesthesia he was placed lithotomy position and prepped and draped in the usual sterile fashion.  Timeout was performed to confirm the patient and procedure.  The injection sheath and scope were inserted per urethra and the bladder filled and emptied several times.  The bladder was then filled to about 250 cc and 300 units of Botox  was injected at 10 units/mL for a total of 30 one mL injections in the usual grid like fashion.  The bladder was filled and emptied a few more times to ensure adequate hemostasis.  The scope was then removed and a 16 French Foley catheter was placed left to gravity drainage.  Urine was clear.  He was awakened taken the cover room in stable condition.  Complications: None  Blood loss: Minimal  Specimens: None  Drains: 16 French Foley catheter  Disposition: Patient stable to PACU.  I discussed the procedure, postop care and follow-up with [name].

## 2024-04-21 ENCOUNTER — Encounter (HOSPITAL_COMMUNITY): Payer: Self-pay | Admitting: Urology
